# Patient Record
Sex: Male | Born: 1965 | Race: White | Hispanic: No | Marital: Married | Smoking: Never smoker
Health system: Southern US, Community
[De-identification: ages and names within clinical notes are randomized; demographics above are authoritative.]

## PROBLEM LIST (undated history)

## (undated) ENCOUNTER — Emergency Department (HOSPITAL_COMMUNITY): Payer: Self-pay | Source: Home / Self Care

## (undated) DIAGNOSIS — I2510 Atherosclerotic heart disease of native coronary artery without angina pectoris: Secondary | ICD-10-CM

## (undated) DIAGNOSIS — K219 Gastro-esophageal reflux disease without esophagitis: Secondary | ICD-10-CM

## (undated) DIAGNOSIS — H409 Unspecified glaucoma: Secondary | ICD-10-CM

## (undated) DIAGNOSIS — M549 Dorsalgia, unspecified: Secondary | ICD-10-CM

## (undated) DIAGNOSIS — T7840XA Allergy, unspecified, initial encounter: Secondary | ICD-10-CM

## (undated) DIAGNOSIS — Z22322 Carrier or suspected carrier of Methicillin resistant Staphylococcus aureus: Secondary | ICD-10-CM

## (undated) DIAGNOSIS — I209 Angina pectoris, unspecified: Secondary | ICD-10-CM

## (undated) DIAGNOSIS — K760 Fatty (change of) liver, not elsewhere classified: Secondary | ICD-10-CM

## (undated) DIAGNOSIS — G4730 Sleep apnea, unspecified: Secondary | ICD-10-CM

## (undated) DIAGNOSIS — I10 Essential (primary) hypertension: Secondary | ICD-10-CM

## (undated) HISTORY — PX: TYMPANOSTOMY TUBE PLACEMENT: SHX32

## (undated) HISTORY — PX: CERVICAL FUSION: SHX112

## (undated) HISTORY — DX: Dorsalgia, unspecified: M54.9

## (undated) HISTORY — DX: Carrier or suspected carrier of methicillin resistant Staphylococcus aureus: Z22.322

## (undated) HISTORY — PX: COLONOSCOPY: SHX174

## (undated) HISTORY — DX: Essential (primary) hypertension: I10

## (undated) HISTORY — PX: TONSILLECTOMY AND ADENOIDECTOMY: SHX28

## (undated) HISTORY — DX: Allergy, unspecified, initial encounter: T78.40XA

## (undated) HISTORY — DX: Sleep apnea, unspecified: G47.30

## (undated) HISTORY — DX: Unspecified glaucoma: H40.9

## (undated) HISTORY — PX: OTHER SURGICAL HISTORY: SHX169

---

## 2015-03-25 ENCOUNTER — Ambulatory Visit (INDEPENDENT_AMBULATORY_CARE_PROVIDER_SITE_OTHER): Payer: Federal, State, Local not specified - PPO | Admitting: Family Medicine

## 2015-03-25 VITALS — BP 138/90 | HR 82 | Temp 98.4°F | Resp 16 | Ht 66.0 in | Wt 221.0 lb

## 2015-03-25 DIAGNOSIS — M549 Dorsalgia, unspecified: Secondary | ICD-10-CM | POA: Diagnosis not present

## 2015-03-25 DIAGNOSIS — G8929 Other chronic pain: Secondary | ICD-10-CM | POA: Diagnosis not present

## 2015-03-25 DIAGNOSIS — M5136 Other intervertebral disc degeneration, lumbar region: Secondary | ICD-10-CM | POA: Diagnosis not present

## 2015-03-25 MED ORDER — TRAMADOL HCL 50 MG PO TABS: 50.0000 mg | ORAL_TABLET | Freq: Three times a day (TID) | ORAL | Status: DC | PRN

## 2015-03-25 MED ORDER — IBUPROFEN-FAMOTIDINE 800-26.6 MG PO TABS
1.0000 | ORAL_TABLET | Freq: Three times a day (TID) | ORAL | Status: DC
Start: 2015-03-25 — End: 2015-03-25

## 2015-03-25 MED ORDER — CYCLOBENZAPRINE HCL 5 MG PO TABS: 5.0000 mg | ORAL_TABLET | Freq: Three times a day (TID) | ORAL | Status: DC | PRN

## 2015-03-25 MED ORDER — PREDNISONE 20 MG PO TABS: ORAL_TABLET | ORAL | Status: DC

## 2015-03-25 MED ORDER — IBUPROFEN-FAMOTIDINE 800-26.6 MG PO TABS: 1.0000 | ORAL_TABLET | Freq: Three times a day (TID) | ORAL | Status: DC

## 2015-03-25 NOTE — Patient Instructions (Signed)
We are going to treat your back with prednisone for 10 days- this should help especially with your tingling and numbness Continue flexeril as needed Use the tramadol as needed for pain- remember this can make you drowsy; it may make you more drowsy if combined with flexeril While you are on the prednisone do not use the ibuprofen   We will refer you to neurosurgery- if they are not the right person to manage your epidural steroid injection let me know   Let me know if your leg/ back are not back to baseline in the next few days

## 2015-03-25 NOTE — Progress Notes (Signed)
[facility] [address] [place] [address] [phone]  Date:  03/25/2015   Name:  Christopher [name]   DOB:  10/22/1965   MRN:  [id number]  PCP:  No primary care provider on file.    Chief Complaint: Back Pain   History of Present Illness:  Christopher [name] is a 49 y.o. very pleasant male patient who presents with the following:  Here today as a new patient seeking primary care.  He moved to [place] from [place] just recently. 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  He had some sort of adjustment this past Tuesday- today is Friday. The day after his adjustment he was in a lot more pain.  Somewhat reduced pain now but still more than his baseline.   He has had several MRI scans in the past.  Most recent was in June.  He brings a copy of his report which does show several mildly bulging discs and mild canal stenosis He had done occasional epidural steroid injections in his back- last done 12/30/2014.  He then moved houses which flared up his back pain  He has noted some numbness into his right leg for about 2 months, off and on.  It is more persistent just recently .  No bowel or bladder incontinence concerns   He had a pain management doc, a Licensed conveyancer, and a chiropractor in [place].   He  He takes 5mg  of flexeril at night, and takes ibuprofen during the day.  He takes an ibuprofen 800/ famotidine combination product    There are no active problems to display for this patient.   Past Medical History  Diagnosis Date  . Back pain   . MRSA carrier     Past Surgical History  Procedure Laterality Date  . Cervical fusion    . 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 Smoking status: Never Smoker   . Smokeless  tobacco: Never Used  . Alcohol Use: No    Family History  Problem Relation Age of Onset  . Hyperlipidemia Father   . Cancer Maternal Grandfather     Allergies  Allergen Reactions  . Biaxin [Clarithromycin] Shortness Of Breath    Medication list has been reviewed and updated.  No current outpatient prescriptions on file prior to visit.   No current facility-administered medications on file prior to visit.    Review of Systems:  As per HPI- otherwise negative.   Physical Examination: Filed Vitals:   03/25/15 1813  BP: 138/90  Pulse: 82  Temp: 98.4 F (36.9 C)  Resp: 16   Filed Vitals:   03/25/15 1813  Height: 5\' 6"  (1.676 m)  Weight: 221 lb (100.245 kg)   Body mass index is 35.69 kg/(m^2). Ideal Body Weight: Weight in (lb) to have BMI = 25: 154.6  GEN: WDWN, NAD, Non-toxic, A & O x 3, overweight, looks well HEENT: Atraumatic, Normocephalic. Neck supple. No masses, No LAD. Ears and Nose: No external deformity. CV: RRR, No M/G/R. No JVD. No thrill. No extra heart sounds. PULM: CTA B, no wheezes, crackles, rhonchi. No retractions. No resp. distress. No accessory muscle use. EXTR: No c/c/e NEURO Normal  gait.  PSYCH: Normally interactive. Conversant. Not depressed or anxious appearing.  Calm demeanor.  Normal BLE strength and sensation and DTR>  Slightly positive SLR on the right only Normal lumbar flexion, extension No lumbar tenderness to palpation  Assessment and Plan: Degenerative disc disease, lumbar - Plan: predniSONE (DELTASONE) 20 MG tablet, cyclobenzaprine (FLEXERIL) 5 MG tablet, Ambulatory referral to Neurosurgery, traMADol (ULTRAM) 50 MG tablet, Ibuprofen-Famotidine (DUEXIS) 800-26.6 MG TABS, DISCONTINUED: Ibuprofen-Famotidine (DUEXIS) 800-26.6 MG TABS  Treat for exacerbation of his lumbar pain with prednisone.  Tramadol to use as needed for pain because he cannot use his ibuprofen while on the prednisone  Referral to [facility]- they may also be able to do  his injections as well. If they cannot I can refer elsewhere   Signed Lamar Blinks, MD

## 2015-03-26 ENCOUNTER — Telehealth: Payer: Self-pay

## 2015-03-26 NOTE — Telephone Encounter (Signed)
-  Faxed failed yesterday for Due[facility] Rx to [facility]. I called pt because 2 long distance ph #s were written on Rx by Dr Lorelei Pont and wanted to verify what pharm Rx was to go to. Pt clarified to send to [facility] at ph # [phone]. Called in Rx.

## 2015-04-12 ENCOUNTER — Telehealth: Payer: Self-pay

## 2015-04-12 DIAGNOSIS — M545 Low back pain, unspecified: Secondary | ICD-10-CM

## 2015-04-12 NOTE — Telephone Encounter (Signed)
Ibuprofen-Famotidine (DUEXIS) 800-26.6 MG TABS [id number]  Is not covered by insurance, PA required.  What is the patient's diagnosis?  Arthritis Osteoarthritis (OA) Rheumatoid Arthritis (RA) Other diagnosis, please specify: Unknown / Unavailable  Can we say he has any of these?  Question on PA form. I know he has degenerative disc disease.

## 2015-04-15 MED ORDER — IBUPROFEN 800 MG PO TABS: 800.0000 mg | ORAL_TABLET | Freq: Three times a day (TID) | ORAL | Status: DC | PRN

## 2015-04-15 MED ORDER — FAMOTIDINE 20 MG PO TABS
20.0000 mg | ORAL_TABLET | Freq: Two times a day (BID) | ORAL | Status: DC
Start: 2015-04-15 — End: 2015-08-19

## 2015-04-15 NOTE — Telephone Encounter (Signed)
Called him- he takes the duoxexis generally twice a day for his back pain.  However as this is no longer covered he is willing to try separate ibuprofen and pepcid.  Will rx this for him; he will let me know if not as helpful as his duoxexis  Meds ordered this encounter  Medications  . ibuprofen (ADVIL,MOTRIN) 800 MG tablet    Sig: Take 1 tablet (800 mg total) by mouth every 8 (eight) hours as needed.    Dispense:  90 tablet    Refill:  6  . famotidine (PEPCID) 20 MG tablet    Sig: Take 1 tablet (20 mg total) by mouth 2 (two) times daily.    Dispense:  60 tablet    Refill:  6

## 2015-04-16 ENCOUNTER — Ambulatory Visit (INDEPENDENT_AMBULATORY_CARE_PROVIDER_SITE_OTHER): Payer: Federal, State, Local not specified - PPO | Admitting: Emergency Medicine

## 2015-04-16 VITALS — BP 132/84 | HR 101 | Temp 97.9°F | Resp 16 | Ht 66.0 in | Wt 217.8 lb

## 2015-04-16 DIAGNOSIS — H109 Unspecified conjunctivitis: Secondary | ICD-10-CM | POA: Diagnosis not present

## 2015-04-16 DIAGNOSIS — J0140 Acute pansinusitis, unspecified: Secondary | ICD-10-CM

## 2015-04-16 MED ORDER — AMOXICILLIN-POT CLAVULANATE 875-125 MG PO TABS: 1.0000 | ORAL_TABLET | Freq: Two times a day (BID) | ORAL | Status: DC

## 2015-04-16 MED ORDER — PSEUDOEPHEDRINE-GUAIFENESIN ER 60-600 MG PO TB12: 1.0000 | ORAL_TABLET | Freq: Two times a day (BID) | ORAL | Status: DC

## 2015-04-16 MED ORDER — MOXIFLOXACIN HCL 0.5 % OP SOLN: 1.0000 [drp] | Freq: Three times a day (TID) | OPHTHALMIC | Status: DC

## 2015-04-16 NOTE — Patient Instructions (Signed)

## 2015-04-16 NOTE — Progress Notes (Signed)
Subjective:  Patient ID: Christopher [name], male    DOB: 09-Jun-1965  Age: 49 y.o. MRN: [id number]  CC: Eye Problem   HPI Senon Joynes presents  with nasal congestion postnasal drainage and pressure in his right maxillary sinus. He has a concern that he has pinkeye. He has had some drainage from his eye. He has some mild conjunctivitis denies any foreign body sensation or visual symptoms. Has no cough wheezing shortness breath fever chills. No sore throat or ear pain had no improvement with over-the-counter medication  History Kevork has a past medical history of Back pain and MRSA carrier.   He has past surgical history that includes Cervical fusion and carple tunnel in both hands .   His  family history includes Cancer in his maternal grandfather; Hyperlipidemia in his father.  He   reports that he has never smoked. He has never used smokeless tobacco. He reports that he does not drink alcohol or use illicit drugs.  Outpatient Prescriptions Prior to Visit  Medication Sig Dispense Refill  . aspirin 81 MG tablet Take 81 mg by mouth daily.    . calcipotriene (DOVONOX) 0.005 % cream Apply topically 2 (two) times daily.    . cyclobenzaprine (FLEXERIL) 5 MG tablet Take 1 tablet (5 mg total) by mouth 3 (three) times daily as needed for muscle spasms. 40 tablet 1  . fluocinolone (VANOS) 0.01 % cream Apply topically 2 (two) times daily.    Marland Kitchen ibuprofen (ADVIL,MOTRIN) 800 MG tablet Take 1 tablet (800 mg total) by mouth every 8 (eight) hours as needed. 90 tablet 6  . losartan (COZAAR) 50 MG tablet Take 50 mg by mouth daily.    Marland Kitchen omega-3 acid ethyl esters (LOVAZA) 1 G capsule Take by mouth 2 (two) times daily.    . traMADol (ULTRAM) 50 MG tablet Take 1 tablet (50 mg total) by mouth every 8 (eight) hours as needed. 30 tablet 0  . travoprost, benzalkonium, (TRAVATAN) 0.004 % ophthalmic solution 1 drop at bedtime.    . famotidine (PEPCID) 20 MG tablet Take 1 tablet (20 mg total) by mouth 2 (two)  times daily. (Patient not taking: Reported on 04/16/2015) 60 tablet 6  . Ibuprofen-Famotidine (DUEXIS) 800-26.6 MG TABS Take 1 tablet by mouth every 8 (eight) hours. 90 tablet 6  . Ibuprofen-Famotidine 800-26.6 MG TABS Take by mouth.    . predniSONE (DELTASONE) 20 MG tablet Take 2 pills a day for 5 days, then 1 pill a day for 5 days 15 tablet 0   No facility-administered medications prior to visit.    Social History   Social History  . Marital Status: Married    Spouse Name: N/A  . Number of Children: N/A  . Years of Education: N/A   Social History Main Topics  . Smoking status: Never Smoker   . Smokeless tobacco: Never Used  . Alcohol Use: No  . Drug Use: No  . Sexual Activity: Not Asked   Other Topics Concern  . None   Social History Narrative     Review of Systems  Constitutional: Negative for fever, chills and appetite change.  HENT: Positive for congestion, postnasal drip, rhinorrhea and sinus pressure. Negative for ear pain and sore throat.   Eyes: Negative for pain and redness.  Respiratory: Negative for cough, shortness of breath and wheezing.   Cardiovascular: Negative for leg swelling.  Gastrointestinal: Negative for nausea, vomiting, abdominal pain, diarrhea, constipation and blood in stool.  Endocrine: Negative for polyuria.  Genitourinary: Negative  for dysuria, urgency, frequency and flank pain.  Musculoskeletal: Negative for gait problem.  Skin: Negative for rash.  Neurological: Negative for weakness and headaches.  Psychiatric/Behavioral: Negative for confusion and decreased concentration. The patient is not nervous/anxious.     Objective:  BP 132/84 mmHg  Pulse 101  Temp(Src) 97.9 F (36.6 C) (Oral)  Resp 16  Ht 5\' 6"  (1.676 m)  Wt 217 lb 12.8 oz (98.793 kg)  BMI 35.17 kg/m2  SpO2 98%  Physical Exam  Constitutional: He is oriented to person, place, and time. He appears well-developed and well-nourished. No distress.  HENT:  Head:  Normocephalic and atraumatic.  Right Ear: External ear normal.  Left Ear: External ear normal.  Nose: Nose normal.  Eyes: EOM are normal. Pupils are equal, round, and reactive to light. Right eye exhibits discharge. Right conjunctiva is injected. No scleral icterus.  Neck: Normal range of motion. Neck supple. No tracheal deviation present.  Cardiovascular: Normal rate, regular rhythm and normal heart sounds.   Pulmonary/Chest: Effort normal. No respiratory distress. He has no wheezes. He has no rales.  Abdominal: He exhibits no mass. There is no tenderness. There is no rebound and no guarding.  Musculoskeletal: He exhibits no edema.  Lymphadenopathy:    He has no cervical adenopathy.  Neurological: He is alert and oriented to person, place, and time. Coordination normal.  Skin: Skin is warm and dry. No rash noted.  Psychiatric: He has a normal mood and affect. His behavior is normal.      Assessment & Plan:   Precious was seen today for eye problem.  Diagnoses and all orders for this visit:  Conjunctivitis of right eye  Acute pansinusitis, recurrence not specified  Other orders -     amoxicillin-clavulanate (AUGMENTIN) 875-125 MG tablet; Take 1 tablet by mouth 2 (two) times daily. -     pseudoephedrine-guaifenesin (MUCINEX D) 60-600 MG 12 hr tablet; Take 1 tablet by mouth every 12 (twelve) hours. -     moxifloxacin (VIGAMOX) 0.5 % ophthalmic solution; Place 1 drop into the right eye 3 (three) times daily.  I have discontinued Mr. Oelschlager Ibuprofen-Famotidine, predniSONE, and Ibuprofen-Famotidine. I am also having him start on amoxicillin-clavulanate, pseudoephedrine-guaifenesin, and moxifloxacin. Additionally, I am having him maintain his aspirin, travoprost (benzalkonium), losartan, omega-3 acid ethyl esters, calcipotriene, fluocinolone, cyclobenzaprine, traMADol, ibuprofen, famotidine, and pseudoephedrine.  Meds ordered this encounter  Medications  . pseudoephedrine  (SUDAFED) 30 MG tablet    Sig: Take 30 mg by mouth every 4 (four) hours as needed for congestion.  Marland Kitchen amoxicillin-clavulanate (AUGMENTIN) 875-125 MG tablet    Sig: Take 1 tablet by mouth 2 (two) times daily.    Dispense:  20 tablet    Refill:  0  . pseudoephedrine-guaifenesin (MUCINEX D) 60-600 MG 12 hr tablet    Sig: Take 1 tablet by mouth every 12 (twelve) hours.    Dispense:  18 tablet    Refill:  0  . moxifloxacin (VIGAMOX) 0.5 % ophthalmic solution    Sig: Place 1 drop into the right eye 3 (three) times daily.    Dispense:  3 mL    Refill:  0    Appropriate red flag conditions were discussed with the patient as well as actions that should be taken.  Patient expressed his understanding.  Follow-up: Return if symptoms worsen or fail to improve.  Roselee Culver, MD

## 2015-06-18 ENCOUNTER — Encounter: Payer: Self-pay | Admitting: Family Medicine

## 2015-06-23 ENCOUNTER — Encounter: Payer: Self-pay | Admitting: Family Medicine

## 2015-08-19 ENCOUNTER — Ambulatory Visit (INDEPENDENT_AMBULATORY_CARE_PROVIDER_SITE_OTHER): Payer: Federal, State, Local not specified - PPO | Admitting: Physician Assistant

## 2015-08-19 ENCOUNTER — Other Ambulatory Visit: Payer: Federal, State, Local not specified - PPO | Admitting: *Deleted

## 2015-08-19 VITALS — BP 118/90 | HR 81 | Temp 97.7°F | Resp 17 | Ht 66.25 in | Wt 223.0 lb

## 2015-08-19 DIAGNOSIS — M5136 Other intervertebral disc degeneration, lumbar region: Secondary | ICD-10-CM

## 2015-08-19 DIAGNOSIS — I10 Essential (primary) hypertension: Secondary | ICD-10-CM

## 2015-08-19 DIAGNOSIS — L409 Psoriasis, unspecified: Secondary | ICD-10-CM | POA: Diagnosis not present

## 2015-08-19 DIAGNOSIS — E669 Obesity, unspecified: Secondary | ICD-10-CM | POA: Diagnosis not present

## 2015-08-19 DIAGNOSIS — M549 Dorsalgia, unspecified: Secondary | ICD-10-CM | POA: Diagnosis not present

## 2015-08-19 DIAGNOSIS — E7800 Pure hypercholesterolemia, unspecified: Secondary | ICD-10-CM

## 2015-08-19 DIAGNOSIS — Z8614 Personal history of Methicillin resistant Staphylococcus aureus infection: Secondary | ICD-10-CM | POA: Diagnosis not present

## 2015-08-19 DIAGNOSIS — G8929 Other chronic pain: Secondary | ICD-10-CM | POA: Diagnosis not present

## 2015-08-19 DIAGNOSIS — Z8639 Personal history of other endocrine, nutritional and metabolic disease: Secondary | ICD-10-CM | POA: Diagnosis not present

## 2015-08-19 MED ORDER — CALCIPOTRIENE 0.005 % EX CREA: TOPICAL_CREAM | Freq: Two times a day (BID) | CUTANEOUS | Status: DC

## 2015-08-19 MED ORDER — OMEGA-3-ACID ETHYL ESTERS 1 G PO CAPS: 1.0000 g | ORAL_CAPSULE | Freq: Two times a day (BID) | ORAL | Status: DC

## 2015-08-19 MED ORDER — LOSARTAN POTASSIUM 50 MG PO TABS: 50.0000 mg | ORAL_TABLET | Freq: Every day | ORAL | Status: DC

## 2015-08-19 MED ORDER — CYCLOBENZAPRINE HCL 5 MG PO TABS: 5.0000 mg | ORAL_TABLET | Freq: Three times a day (TID) | ORAL | Status: DC | PRN

## 2015-08-19 MED ORDER — TRIAMCINOLONE ACETONIDE 0.5 % EX CREA: 1.0000 "application " | TOPICAL_CREAM | Freq: Three times a day (TID) | CUTANEOUS | Status: DC

## 2015-08-19 MED ORDER — MUPIROCIN 2 % EX OINT: 1.0000 "application " | TOPICAL_OINTMENT | Freq: Two times a day (BID) | CUTANEOUS | Status: DC

## 2015-08-19 NOTE — Patient Instructions (Signed)
     IF you received an x-ray today, you will receive an invoice from [facility]. Please contact [facility] at [phone] with questions or concerns regarding your invoice.   IF you received labwork today, you will receive an invoice from [facility]. Please contact [facility] at [phone] with questions or concerns regarding your invoice.   Our billing staff will not be able to assist you with questions regarding bills from these companies.  You will be contacted with the lab results as soon as they are available. The fastest way to get your results is to activate your My Chart account. Instructions are located on the last page of this paperwork. If you have not heard from us regarding the results in 2 weeks, please contact this office.      

## 2015-08-19 NOTE — Patient Instructions (Addendum)
  My fitness pal - app for weight loss   IF you received an x-ray today, you will receive an invoice from [facility]. Please contact [facility] at [phone] with questions or concerns regarding your invoice.   IF you received labwork today, you will receive an invoice from [facility]. Please contact [facility] at [phone] with questions or concerns regarding your invoice.   Our billing staff will not be able to assist you with questions regarding bills from these companies.  You will be contacted with the lab results as soon as they are available. The fastest way to get your results is to activate your My Chart account. Instructions are located on the last page of this paperwork. If you have not heard from [place] regarding the results in 2 weeks, please contact this office.

## 2015-08-19 NOTE — Progress Notes (Signed)
Christopher [name]  MRN: [id number] DOB: 1965-08-17  Subjective:  Pt presents to clinic for medication refills and discussion of weight loss and MRSA infections.  Patient Active Problem List   Diagnosis Date Noted  . HTN (hypertension) - controlled on medications - takes daily 08/19/2015  . History of MRSA infection - currently has one of his face and he started bactrim that he had at home and it opened and he is getting much better 08/19/2015  . Psoriasis - on left elbow - uses cream to help control 08/19/2015  . Elevated cholesterol - on Lavoza - tried a statin but quite it because of a SE that his dad had that he did not want to risk 08/19/2015  . 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 Chronic back pain - planned injection today but it was canceled because of infection on his face 03/25/2015  H/o Vit D def - about a month ago stopped Vit D 50000 weekly - wants to know if he needs to continue  Current Outpatient Prescriptions on File Prior to Visit  Medication Sig Dispense Refill  . aspirin 81 MG tablet Take 81 mg by mouth daily.    Marland Kitchen ibuprofen (ADVIL,MOTRIN) 800 MG tablet Take 1 tablet (800 mg total) by mouth every 8 (eight) hours as needed. 90 tablet 6  . travoprost, benzalkonium, (TRAVATAN) 0.004 % ophthalmic solution 1 drop at bedtime.     No current facility-administered medications on file prior to visit.    Allergies  Allergen Reactions  . Biaxin [Clarithromycin] Shortness Of Breath    Review of Systems  Respiratory: Negative for cough and shortness of breath.   Cardiovascular: Negative for chest pain, palpitations and leg swelling.   Objective:  BP 118/90 mmHg  Pulse 81   Temp(Src) 97.7 F (36.5 C) (Oral)  Resp 17  Ht 5' 6.25" (1.683 m)  Wt 223 lb (101.152 kg)  BMI 35.71 kg/m2  SpO2 98%  Physical Exam  Constitutional: He is oriented to person, place, and time and well-developed, well-nourished, and in no distress.  HENT:  Head: Normocephalic and atraumatic.  Right Ear: External ear normal.  Left Ear: External ear normal.  Eyes: Conjunctivae are normal.  Neck: Normal range of motion.  Cardiovascular: Normal rate, regular rhythm, normal heart sounds and intact distal pulses.   Pulmonary/Chest: Effort normal and breath sounds normal. He has no wheezes.  Musculoskeletal:       Right lower leg: He exhibits no edema.       Left lower leg: He exhibits no edema.  Neurological: He is alert and oriented to person, place, and time. Gait normal.  Skin: Skin is warm and dry.  1 cm indurated mildly erythematous area on his right cheek bone area - mildly tender  Psychiatric: Mood, memory, affect and judgment normal.   Spent 45 mins with patient >50% spent in counseling Assessment and Plan :  Essential hypertension - Plan: COMPLETE METABOLIC PANEL WITH GFR, losartan (COZAAR) 50 MG tablet, Care order/instruction - continue current medications - diastolic BP is slightly elevated today - losing weight will help this a lot  History of MRSA infection - Plan: mupirocin ointment (BACTROBAN) 2 % - pt to use up his nose and on current open wounds - he will continue the septra that he has for his current facial abscess which is better - he will f/u in the future  Degenerative disc disease, lumbar - Plan: cyclobenzaprine (FLEXERIL) 5 MG tablet - gave him muscle relaxers for when his back acts up  Psoriasis - Plan: triamcinolone cream (KENALOG) 0.5 %, calcipotriene (DOVONOX) 0.005 % cream - continue creams as needed  History of vitamin D deficiency - Plan: VITAMIN D 25 Hydroxy (Vit-D Deficiency, Fractures) - check labs  Chronic back pain - continue with back specialist as  needed  Elevated cholesterol - Plan: omega-3 acid ethyl esters (LOVAZA) 1 g capsule, Lipid panel - check labs - pt will RTC this afterrnon after fasting for 6 hrs to get a good accurate reading  Obesity, Class II, BMI 35-39.9 - d/w pt at length risk and benefits of diet aids - he discussed weight loss is best achieved by caloric intake change - he should be about at a 1500 cal/day diet.  He is not interested in a nutritionist at this point but he will beginning making modification.  He thinks it will be a lot better now that he is home vs on the road for work.  Windell Hummingbird PA-C  [facility] 08/19/2015 9:49 AM

## 2015-08-20 ENCOUNTER — Encounter: Payer: Self-pay | Admitting: Physician Assistant

## 2015-08-20 LAB — COMPLETE METABOLIC PANEL WITH GFR
ALBUMIN: 4.3 g/dL (ref 3.6–5.1)
ALK PHOS: 75 U/L (ref 40–115)
ALT: 104 U/L — ABNORMAL HIGH (ref 9–46)
AST: 54 U/L — AB (ref 10–40)
BUN: 13 mg/dL (ref 7–25)
CALCIUM: 9.4 mg/dL (ref 8.6–10.3)
CHLORIDE: 104 mmol/L (ref 98–110)
CO2: 26 mmol/L (ref 20–31)
Creat: 1.03 mg/dL (ref 0.60–1.35)
GFR, EST NON AFRICAN AMERICAN: 86 mL/min (ref >=60)
Glucose, Bld: 95 mg/dL (ref 65–99)
POTASSIUM: 4.6 mmol/L (ref 3.5–5.3)
Sodium: 139 mmol/L (ref 135–146)
Total Bilirubin: 0.4 mg/dL (ref 0.2–1.2)
Total Protein: 6.6 g/dL (ref 6.1–8.1)

## 2015-08-20 LAB — LIPID PANEL
CHOL/HDL RATIO: 8.9 ratio — AB (ref <=5.0)
CHOLESTEROL: 195 mg/dL (ref 125–200)
HDL: 22 mg/dL — ABNORMAL LOW (ref >=40)
TRIGLYCERIDES: 446 mg/dL — AB (ref <150)

## 2015-08-20 LAB — VITAMIN D 25 HYDROXY (VIT D DEFICIENCY, FRACTURES): Vit D, 25-Hydroxy: 37 ng/mL (ref 30–100)

## 2015-08-24 ENCOUNTER — Telehealth: Payer: Self-pay

## 2015-08-24 NOTE — Telephone Encounter (Signed)
Pt would like to know if  Christopher [name] would refer him to [facility] for a sleep study. Please call pt at [phone] and the number to the sleep study is [phone], pt states he would Like it done asap

## 2015-08-27 ENCOUNTER — Telehealth: Payer: Self-pay | Admitting: Physician Assistant

## 2015-08-27 DIAGNOSIS — G4730 Sleep apnea, unspecified: Secondary | ICD-10-CM

## 2015-08-27 NOTE — Telephone Encounter (Signed)
Please call patient

## 2015-08-27 NOTE — Telephone Encounter (Signed)
Why does he think he needs one - I do not mind but I need a reason - snoring - stops breathing at night etc.

## 2015-08-27 NOTE — Telephone Encounter (Signed)
Left message for pt to call back  °

## 2015-08-27 NOTE — Telephone Encounter (Signed)
Patient returning phone call. Please give patient a call back,

## 2015-08-30 NOTE — Telephone Encounter (Signed)
LM to call me back for information on why he wants a sleep study.

## 2015-08-30 NOTE — Telephone Encounter (Signed)
Spoke with pt, he needs a referral to a doctor to read the his CPAP machine. The [employer] needs this. He had a study done back in 2009 in [place]. Please advise.

## 2015-08-31 DIAGNOSIS — G4730 Sleep apnea, unspecified: Secondary | ICD-10-CM | POA: Insufficient documentation

## 2015-08-31 DIAGNOSIS — G4733 Obstructive sleep apnea (adult) (pediatric): Secondary | ICD-10-CM | POA: Insufficient documentation

## 2015-08-31 NOTE — Telephone Encounter (Signed)
Does he need a print out or does he need another titration - if he needs a print out he should be able to call the company (on the label on his machine) and they should be able to help him.  If he needs a titration I can do a referral.

## 2015-08-31 NOTE — Telephone Encounter (Signed)
Left message for pt to call back  °

## 2015-09-02 NOTE — Telephone Encounter (Signed)
Done - did referral to neurology

## 2015-10-11 ENCOUNTER — Encounter: Payer: Self-pay | Admitting: Neurology

## 2015-10-11 ENCOUNTER — Ambulatory Visit (INDEPENDENT_AMBULATORY_CARE_PROVIDER_SITE_OTHER): Payer: Federal, State, Local not specified - PPO | Admitting: Neurology

## 2015-10-11 VITALS — BP 146/98 | HR 88 | Resp 20 | Ht 65.0 in | Wt 218.0 lb

## 2015-10-11 DIAGNOSIS — G4733 Obstructive sleep apnea (adult) (pediatric): Secondary | ICD-10-CM | POA: Diagnosis not present

## 2015-10-11 DIAGNOSIS — Z9989 Dependence on other enabling machines and devices: Principal | ICD-10-CM

## 2015-10-11 NOTE — Progress Notes (Signed)
SLEEP MEDICINE CLINIC   Provider:  Larey Seat, M D  Referring Provider: Darreld Mclean, MD Primary Care Physician:  Lamar Blinks, MD  Chief Complaint  Patient presents with  . 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 male , seen here as a referral from Dr. 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 The patient provided me with several copies of previous sleep studies. The patient also owns 2 machines- one of the normal longer downloadable through our software. Christopher [name]. presented with a sleep study from [place] [facility]. At the time in 4058 he was 50 year old gentleman referred for possible sleep disordered breathing he reached an AHI of 88 with an oxygen nadir of 64%, had loud snoring, no PLMS were noted. He was titrated to 15 cm water pressure he used a nasal interface. This is a titration his AHI was reduced to 4.4 an improvement of over 90%. BiPAP was not attempted. On 15 cm the AHI was 0.8.  He then underwent a new titration on 11/12/2013 in [place]. He was re-titrated to CPAP this time he had frequent periodic limb movements for an index of 20.4 but very few arousals. He was titrated to 14 cm water pressure but his AHI was 3.5. I would venture to say that he was under titrated.  The patient To his old machine and received a new one which she has preferably used since. He has used a new machine 329 out of 365 days which is at 90% compliance he has 100% compliance for the last 30 days his AHI is 1.7 on 14 cm water was 1 cm EPR. Average user time is 8 hours and 5 minutes. Serial number is [id number]. The patient endorsed today the Epworth Sleepiness Scale at 0 points. The patient is using CPAP  now for almost a decade and is highly compliant.  Sleep habits are as follows: He goes to bed at 9:30 PM and usually falls asleep promptly. The bedroom is cool quiet and dark, he prefers to sleep on his side on one pillow. He rises at 5:30 with an alarm. Total sleep time is estimated to exceed 7-1/2 hours- more likely 8 hours. He feels refreshed and rested.   Medical history ; HTN, obesity, high cholesterol.  Social history: 3 stepdaughters, 1 biological .  Review of Systems: Out of a complete 14 system review, the patient complains of only the following symptoms, and all other reviewed systems are negative. Snoring, weight gain.  Epworth score  0 , Fatigue severity score 9  , depression score 0   Social History   Social History  . Marital Status: Married    Spouse Name: N/A  . Number of Children: N/A  . Years of Education: N/A   Occupational History  . Not on file.   Social History Main Topics  . Smoking status: Never Smoker   . Smokeless tobacco: Never Used  . Alcohol Use: No  . Drug Use: No  . Sexual Activity: Not on file   Other Topics Concern  . Not on file   Social History Narrative    Family History  Problem Relation Age  of Onset  . Hyperlipidemia Father   . Cancer Maternal Grandfather     Past Medical History  Diagnosis Date  . Back pain   . MRSA carrier     Past Surgical History  Procedure Laterality Date  . Cervical fusion    . 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 aspirin 81 MG tablet Take 81 mg by mouth daily.    . cyclobenzaprine (FLEXERIL) 5 MG tablet Take 1 tablet (5 mg total) by mouth 3 (three) times daily as needed for muscle spasms. 60 tablet 1  . ibuprofen (ADVIL,MOTRIN) 800 MG tablet Take 1 tablet (800 mg total) by mouth every 8 (eight) hours as needed. 90 tablet 6  . losartan (COZAAR) 50 MG tablet Take 1 tablet (50 mg total) by mouth daily. 90 tablet 1  . mupirocin  ointment (BACTROBAN) 2 % Place 1 application into the nose 2 (two) times daily. 30 g 3  . omega-3 acid ethyl esters (LOVAZA) 1 g capsule Take 1 capsule (1 g total) by mouth 2 (two) times daily. 180 capsule 1  . travoprost, benzalkonium, (TRAVATAN) 0.004 % ophthalmic solution 1 drop at bedtime.     No current facility-administered medications for this visit.    Allergies as of 10/11/2015 - Review Complete 10/11/2015  Allergen Reaction Noted  . Biaxin [clarithromycin] Shortness Of Breath 03/25/2015    Vitals: BP 146/98 mmHg  Pulse 88  Resp 20  Ht 5\' 5"  (1.651 m)  Wt 218 lb (98.884 kg)  BMI 36.28 kg/m2 Last Weight:  Wt Readings from Last 1 Encounters:  10/11/15 218 lb (98.884 kg)   [id number] mass index is 36.28 kg/(m^2).     Last Height:   Ht Readings from Last 1 Encounters:  10/11/15 5\' 5"  (1.651 m)    Physical exam:  General: The patient is awake, alert and appears not in acute distress. The patient is well groomed. Head: Normocephalic, atraumatic. Neck is supple. Mallampati 3,  neck circumference: 20. Nasal airflow unrestricted , TMJ  Click  not  evident . Retrognathia is seen.  Cardiovascular:  Regular rate and rhythm, without  murmurs or carotid bruit, and without distended neck veins. Respiratory: Lungs are clear to auscultation. Skin:  Without evidence of edema, or rash . Trunk: BMI is high . The patient's posture is erect.  Neurologic exam : The patient is awake and alert, oriented to place and time.   Attention span & concentration ability appears normal.  Speech is fluent,  without dysarthria, dysphonia or aphasia.  Mood and affect are appropriate.  Cranial nerves:  Intact smell and taste .Pupils are equal and briskly reactive to light.   Extraocular movements  in vertical and horizontal planes intact and without nystagmus. Visual fields by finger perimetry are intact. Hearing to finger rub intact. Facial sensation intact to fine touch. Facial motor strength is  symmetric and tongue and uvula move midline. Shoulder shrug was symmetrical.  Motor exam:  Tone, muscle bulk and strength intact in all extremities. Sensory:  Fine touch, pinprick and vibration, proprioception tested in the upper extremities was normal. Coordination: Rapid alternating movements in the fingers/hands was normal.  Finger-to-nose maneuver  normal without evidence of ataxia, dysmetria or tremor. Gait and station: Patient walks without assistive device and is able unassisted to climb up to the exam table. Strength within normal limits. Stance is stable and normal. Deep tendon reflexes: in the  upper and  lower extremities are symmetric and intact.  Babinski maneuver response is downgoing.  The patient was advised of the nature of the diagnosed sleep disorder , the treatment options and risks for general a health and wellness arising from not treating the condition.  I spent more than 30 minutes of face to face time with the patient. Greater than 50% of time was spent in counseling and coordination of care. We have discussed the diagnosis and differential and I answered the patient's questions.    Assessment:  After physical and neurologic examination, review of laboratory studies,  Personal review of imaging studies, reports of other /same  Imaging studies ,  Results of polysomnography/ neurophysiology testing and pre-existing records as far as provided in visit., my assessment is   1) Christopher [name] is patient with a brand new CPAP set at good therapeutic pressure, and has high compliance data.  He should be qualified to fly.   2)  OSA with main risk factor of weight and neck seize.    Plan:  Treatment plan and additional workup : yearly follow up.    Christopher Partridge Loyalty Brashier MD  10/11/2015   CC: Darreld Mclean, [address], [place] [address]

## 2015-10-11 NOTE — Patient Instructions (Addendum)
Patient is FAA standard compliant with CPAP use.  10-11-2015  To whom it may concern,  [name] is 100% compliance for time in days of use of his CPAP machine and controls his sleep apnea successful. His residual AHI is 1.7 which is a very desirable result. He uses the machine over 8 hours each night. I was able to review at 365 day and a 30 day download. He fulfills all criteria of the FFA.   Tam Delisle, MD

## 2015-11-10 ENCOUNTER — Ambulatory Visit (INDEPENDENT_AMBULATORY_CARE_PROVIDER_SITE_OTHER): Payer: Federal, State, Local not specified - PPO | Admitting: Internal Medicine

## 2015-11-10 VITALS — BP 114/72 | HR 76 | Temp 98.0°F | Resp 18 | Ht 65.0 in | Wt 212.2 lb

## 2015-11-10 DIAGNOSIS — A09 Infectious gastroenteritis and colitis, unspecified: Secondary | ICD-10-CM | POA: Diagnosis not present

## 2015-11-10 DIAGNOSIS — R197 Diarrhea, unspecified: Secondary | ICD-10-CM

## 2015-11-10 DIAGNOSIS — R112 Nausea with vomiting, unspecified: Secondary | ICD-10-CM | POA: Diagnosis not present

## 2015-11-10 LAB — POCT CBC
Granulocyte percent: 71.1 %G (ref 37–80)
HCT, POC: 44.8 % (ref 43.5–53.7)
HEMOGLOBIN: 15.6 g/dL (ref 14.1–18.1)
LYMPH, POC: 2.6 (ref 0.6–3.4)
MCH, POC: 29.8 pg (ref 27–31.2)
MCHC: 34.9 g/dL (ref 31.8–35.4)
MCV: 85.5 fL (ref 80–97)
MID (cbc): 0.5 (ref 0–0.9)
MPV: 7.1 fL (ref 0–99.8)
POC Granulocyte: 7.5 — AB (ref 2–6.9)
POC LYMPH PERCENT: 24.5 %L (ref 10–50)
POC MID %: 4.4 %M (ref 0–12)
Platelet Count, POC: 308 10*3/uL (ref 142–424)
RBC: 5.24 M/uL (ref 4.69–6.13)
RDW, POC: 12.3 %
WBC: 10.5 10*3/uL — AB (ref 4.6–10.2)

## 2015-11-10 MED ORDER — DICYCLOMINE HCL 10 MG PO CAPS: 20.0000 mg | ORAL_CAPSULE | Freq: Three times a day (TID) | ORAL | Status: DC

## 2015-11-10 MED ORDER — CIPROFLOXACIN HCL 250 MG PO TABS: 250.0000 mg | ORAL_TABLET | Freq: Two times a day (BID) | ORAL | Status: DC

## 2015-11-10 NOTE — Patient Instructions (Signed)
     IF you received an x-ray today, you will receive an invoice from [facility]. Please contact [facility] at [phone] with questions or concerns regarding your invoice.   IF you received labwork today, you will receive an invoice from [facility]. Please contact [facility] at [phone] with questions or concerns regarding your invoice.   Our billing staff will not be able to assist you with questions regarding bills from these companies.  You will be contacted with the lab results as soon as they are available. The fastest way to get your results is to activate your My Chart account. Instructions are located on the last page of this paperwork. If you have not heard from us regarding the results in 2 weeks, please contact this office.      

## 2015-11-10 NOTE — Progress Notes (Signed)
Subjective:  By signing my name below, I, Raven Small, attest that this documentation has been prepared under the direction and in the presence of Tami Lin, MD.  Electronically Signed: Thea Alken, ED Scribe. 11/10/2015. 6:15 PM.   Patient ID: Christopher [name], male    DOB: 03/24/1966, 50 y.o.   MRN: [id number]  HPI Chief Complaint  Patient presents with  . Nausea    started on Friday after eating at [facility], unable to eat  . Diarrhea    started on Friday    HPI Comments: Christopher [name] is a 50 y.o. male who presents to the [facility] complaining of nausea and diarrhea onset 5 days ago. Pt recalls developing nausea and diarrhea after eat 2 cheese burgers from [employer] in [place] 5 days ago. Diarrhea is resolved but still has nausea and abdominal pain after eating. He has been taking imodium and pepto. He denies blood in stool.  Patient Active Problem List   Diagnosis Date Noted  . Sleep apnea 08/31/2015  . HTN (hypertension) 08/19/2015  . History of MRSA infection 08/19/2015  . Psoriasis 08/19/2015  . Elevated cholesterol 08/19/2015  . Obesity, Class II, BMI 35-39.9 08/19/2015  . Chronic back pain 03/25/2015   Past Medical History  Diagnosis Date  . Back pain   . MRSA carrier    Past Surgical History  Procedure Laterality Date  . Cervical fusion    . 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 Biaxin [Clarithromycin] Shortness Of Breath   Prior to Admission medications   Medication Sig Start Date End Date Taking? Authorizing Provider  aspirin 81 MG tablet Take 81 mg by mouth daily.   Yes Historical Provider, MD  bismuth subsalicylate (PEPTO BISMOL) 262 MG/15ML suspension Take 30 mLs by mouth every 6 (six) hours as needed.   Yes Historical Provider, MD  cyclobenzaprine (FLEXERIL) 5 MG tablet Take 1 tablet (5 mg total) by mouth 3 (three) times daily as needed for muscle spasms. 08/19/15  Yes Mancel Bale, PA-C    ibuprofen (ADVIL,MOTRIN) 800 MG tablet Take 1 tablet (800 mg total) by mouth every 8 (eight) hours as needed. 04/15/15  Yes Gay Filler Copland, MD  losartan (COZAAR) 50 MG tablet Take 1 tablet (50 mg total) by mouth daily. 08/19/15  Yes Mancel Bale, PA-C  omega-3 acid ethyl esters (LOVAZA) 1 g capsule Take 1 capsule (1 g total) by mouth 2 (two) times daily. 08/19/15  Yes Mancel Bale, PA-C  travoprost, benzalkonium, (TRAVATAN) 0.004 % ophthalmic solution 1 drop at bedtime.   Yes Historical Provider, MD  mupirocin ointment (BACTROBAN) 2 % Place 1 application into the nose 2 (two) times daily. Patient not taking: Reported on 11/10/2015 08/19/15   Mancel Bale, PA-C   Social History   Social History  . Marital Status: Married    Spouse Name: N/A  . Number of Children: N/A  . Years of Education: N/A   Occupational History  . Not on file.   Social History Main Topics  . Smoking status: Never Smoker   . Smokeless tobacco: Never Used  . Alcohol Use: No  . Drug Use: No  . Sexual Activity: Not on file   Other Topics Concern  . Not on file   Social History Narrative   Review of Systems  Constitutional: Negative for fever and chills.  Gastrointestinal: Positive for nausea, abdominal pain and diarrhea. Negative for blood in stool.  Objective:   Physical Exam  Constitutional: He is oriented to person, place, and time. He appears well-developed and well-nourished. No distress.  HENT:  Head: Normocephalic and atraumatic.  Right Ear: External ear normal.  Left Ear: External ear normal.  Eyes: Conjunctivae and EOM are normal.  Neck: Normal range of motion. Neck supple.  Cardiovascular: Normal rate, regular rhythm and normal heart sounds.   No murmur heard. Pulmonary/Chest: Effort normal and breath sounds normal.  Abdominal: Soft. Bowel sounds are increased. There is tenderness ( mildly) in the right lower quadrant and left lower quadrant. There is no rebound.  Musculoskeletal:  Normal range of motion.  Neurological: He is alert and oriented to person, place, and time.  Skin: Skin is warm and dry.  Psychiatric: He has a normal mood and affect. His behavior is normal.  Nursing note and vitals reviewed.  Filed Vitals:   11/10/15 1729  BP: 114/72  Pulse: 76  Temp: 98 F (36.7 C)  TempSrc: Oral  Resp: 18  Height: 5\' 5"  (1.651 m)  Weight: 212 lb 3.2 oz (96.253 kg)  SpO2: 98%    Results for orders placed or performed in visit on 11/10/15  POCT CBC  Result Value Ref Range   WBC 10.5 (A) 4.6 - 10.2 K/uL   Lymph, poc 2.6 0.6 - 3.4   POC LYMPH PERCENT 24.5 10 - 50 %L   MID (cbc) 0.5 0 - 0.9   POC MID % 4.4 0 - 12 %M   POC Granulocyte 7.5 (A) 2 - 6.9   Granulocyte percent 71.1 37 - 80 %G   RBC 5.24 4.69 - 6.13 M/uL   Hemoglobin 15.6 14.1 - 18.1 g/dL   HCT, POC 44.8 43.5 - 53.7 %   MCV 85.5 80 - 97 fL   MCH, POC 29.8 27 - 31.2 pg   MCHC 34.9 31.8 - 35.4 g/dL   RDW, POC 12.3 %   Platelet Count, POC 308 142 - 424 K/uL   MPV 7.1 0 - 99.8 fL   Assessment & Plan:  Nausea and vomiting, intractability of vomiting not specified, unspecified vomiting type - Plan: POCT CBC  Diarrhea of presumed infectious origin - Plan: POCT CBC  Meds ordered this encounter  Medications  . bismuth subsalicylate (PEPTO BISMOL) 262 MG/15ML suspension    Sig: Take 30 mLs by mouth every 6 (six) hours as needed.  . ciprofloxacin (CIPRO) 250 MG tablet    Sig: Take 1 tablet (250 mg total) by mouth 2 (two) times daily.    Dispense:  10 tablet    Refill:  0  . dicyclomine (BENTYL) 10 MG capsule    Sig: Take 2 capsules (20 mg total) by mouth 4 (four) times daily -  before meals and at bedtime. For 3 days    Dispense:  24 capsule    Refill:  0   I have completed the patient encounter in its entirety as documented by the scribe, with editing by me where necessary. Elianne Gubser P. Laney Pastor, M.D.

## 2015-12-04 ENCOUNTER — Other Ambulatory Visit: Payer: Self-pay | Admitting: Physician Assistant

## 2016-02-04 ENCOUNTER — Ambulatory Visit (INDEPENDENT_AMBULATORY_CARE_PROVIDER_SITE_OTHER): Payer: Federal, State, Local not specified - PPO | Admitting: Physician Assistant

## 2016-02-04 VITALS — BP 138/90 | HR 79 | Temp 98.4°F | Resp 16 | Ht 65.0 in | Wt 223.0 lb

## 2016-02-04 DIAGNOSIS — L089 Local infection of the skin and subcutaneous tissue, unspecified: Secondary | ICD-10-CM | POA: Diagnosis not present

## 2016-02-04 DIAGNOSIS — L309 Dermatitis, unspecified: Secondary | ICD-10-CM | POA: Diagnosis not present

## 2016-02-04 MED ORDER — SULFAMETHOXAZOLE-TRIMETHOPRIM 800-160 MG PO TABS: 1.0000 | ORAL_TABLET | Freq: Two times a day (BID) | ORAL | 0 refills | Status: DC

## 2016-02-04 MED ORDER — MUPIROCIN 2 % EX OINT: 1.0000 "application " | TOPICAL_OINTMENT | Freq: Two times a day (BID) | CUTANEOUS | 1 refills | Status: DC

## 2016-02-04 NOTE — Progress Notes (Addendum)
Patient ID: Christopher [name], male   DOB: 1965-10-19, 50 y.o.   MRN: [id number] [facility] [address] [place] [address] [phone]  By signing my name below, I, Christopher [name], attest that this documentation has been prepared under the direction and in the presence of Christopher Drape, PA-C Electronically Signed: Ladene [name], ED Scribe 02/04/2016 at 6:29 PM.  Date:  02/04/2016   Name:  Christopher [name]   DOB:  1966/02/14   MRN:  [id number]  PCP:  Christopher Blinks, MD   History of Present Illness:  Christopher [name] is a 50 y.o. male patient who presents to [facility] complaining of a generalized rash. He noticed a bump that has grown increasingly more painful at his finger. His also noticed small bumps gathering at the left side of his cheek and chin. He denies any fever, nausea, dizziness. There has been no drainage of any of the lesions. He has enough vein to help treated. Patient has a history of MRSA outbreaks. 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 He has attempted a nare's treatment before to decolonize, however may have done for one day.  He recently had a steroid injection for back.  He has a hx of developing a flare up after a prednisone dose pack--4 months ago.   Patient Active Problem List   Diagnosis Date Noted   Sleep apnea 08/31/2015   HTN (hypertension) 08/19/2015   History of MRSA infection 08/19/2015   Psoriasis 08/19/2015   Elevated cholesterol 08/19/2015   Obesity, Class II, BMI 35-39.9 08/19/2015   Chronic back pain 03/25/2015    Past Medical History:  Diagnosis Date   Back pain    MRSA carrier     Past Surgical History:  Procedure Laterality Date   carple tunnel in both hands      CERVICAL FUSION      Social History  Substance Use Topics   Smoking status: Never Smoker   Smokeless tobacco: Never Used   Alcohol use No    Family History  Problem Relation Age of Onset   Hyperlipidemia Father    Cancer  Maternal Grandfather     Allergies  Allergen Reactions   Biaxin [Clarithromycin] Shortness Of Breath    Medication list has been reviewed and updated.  Current Outpatient Prescriptions on File Prior to Visit  Medication Sig Dispense Refill   aspirin 81 MG tablet Take 81 mg by mouth daily.     ibuprofen (ADVIL,MOTRIN) 800 MG tablet Take 1 tablet (800 mg total) by mouth every 8 (eight) hours as needed. 90 tablet 6   losartan (COZAAR) 50 MG tablet Take 1 tablet (50 mg total) by mouth daily. 90 tablet 1   mupirocin ointment (BACTROBAN) 2 % Place 1 application into the nose 2 (two) times daily. 30 g 3   omega-3 acid ethyl esters (LOVAZA) 1 g capsule Take 1 capsule (1 g total) by mouth 2 (two) times daily. 180 capsule 1   travoprost, benzalkonium, (TRAVATAN) 0.004 % ophthalmic solution 1 drop at bedtime.     No current facility-administered medications on file prior to visit.     Review of Systems  Skin: Positive for rash.    Physical Examination: BP 138/90 (BP Location: Right Arm, Patient Position: Sitting, Cuff Size: Normal)    Pulse 79    Temp 98.4 F (36.9 C) (Oral)    Resp 16    Ht 5\' 5"  (1.651 m)    Wt 223 lb (101.2 kg)  SpO2 98%    BMI 37.11 kg/m  Ideal Body Weight: @FLOWAMB [id number]  Physical Exam  Constitutional: He is oriented to person, place, and time. He appears well-developed and well-nourished. No distress.  HENT:  Head: Normocephalic and atraumatic.  Eyes: Conjunctivae and EOM are normal. Pupils are equal, round, and reactive to light.  Cardiovascular: Normal rate.   Pulmonary/Chest: Effort normal. No respiratory distress.  Neurological: He is alert and oriented to person, place, and time.  Skin: Skin is warm and dry. Rash noted. He is not diaphoretic.  Second left phalanx with pustule with surrounding erythema at the between the PIP and DIP.  There is mild swelling surrounding this. Range of motion is somewhat what decreased. There is no lymphangitis.  No drainage from the area.  Erythematous and somewhat open lesions are scant along the mandible particularly on the left side.  No lymphadenopathy detected.   Psychiatric: He has a normal mood and affect. His behavior is normal.    Assessment and Plan: Christopher [name] is a 50 y.o. male who is here today for rash. He was given mupirocin and Bactrim today. I've advised him to take the medication as prescribed. He will allow 48 hours and if this is not improved he will return. I've also advised him to start the mupirocin nares treatment now or within the next 5 days for 10 days of treatment.  Advised of alarming symptoms to warrant a more immediate return.  Dermatitis - Plan: sulfamethoxazole-trimethoprim (BACTRIM DS,SEPTRA DS) 800-160 MG tablet  Skin infection - Plan: sulfamethoxazole-trimethoprim (BACTRIM DS,SEPTRA DS) 800-160 MG tablet   Christopher Drape, PA-C [facility] 02/04/2016 6:29 PM  Wound culture was obtained with deroofing lesion, however this was not ordered, and specimen was disposed without my consent.

## 2016-02-04 NOTE — Patient Instructions (Addendum)
Please apply the mupirocin to the nares twice per day for 5-10 days.  You can start 5 days after you start the antibiotic.  This should be placed the way we discussed.  You can use it topically as well now.  Please take the oral medication as prescribed.  Take with food.   I will have your wound culture shortly.     IF you received an x-ray today, you will receive an invoice from [facility]. Please contact [facility] at [phone] with questions or concerns regarding your invoice.   IF you received labwork today, you will receive an invoice from [facility]. Please contact [facility] at [phone] with questions or concerns regarding your invoice.   Our billing staff will not be able to assist you with questions regarding bills from these companies.  You will be contacted with the lab results as soon as they are available. The fastest way to get your results is to activate your My Chart account. Instructions are located on the last page of this paperwork. If you have not heard from [place] regarding the results in 2 weeks, please contact this office.

## 2016-02-07 ENCOUNTER — Ambulatory Visit (INDEPENDENT_AMBULATORY_CARE_PROVIDER_SITE_OTHER): Payer: Federal, State, Local not specified - PPO | Admitting: Physician Assistant

## 2016-02-07 VITALS — BP 124/80 | HR 85 | Temp 98.9°F | Resp 17 | Ht 66.5 in | Wt 221.0 lb

## 2016-02-07 DIAGNOSIS — L089 Local infection of the skin and subcutaneous tissue, unspecified: Secondary | ICD-10-CM | POA: Diagnosis not present

## 2016-02-07 DIAGNOSIS — M7989 Other specified soft tissue disorders: Secondary | ICD-10-CM

## 2016-02-07 LAB — POCT CBC
Granulocyte percent: 74.3 %G (ref 37–80)
HEMATOCRIT: 43.2 % — AB (ref 43.5–53.7)
HEMOGLOBIN: 15.4 g/dL (ref 14.1–18.1)
LYMPH, POC: 2.2 (ref 0.6–3.4)
MCH, POC: 30.1 pg (ref 27–31.2)
MCHC: 35.7 g/dL — AB (ref 31.8–35.4)
MCV: 84.3 fL (ref 80–97)
MID (CBC): 0.6 (ref 0–0.9)
MPV: 7.1 fL (ref 0–99.8)
POC GRANULOCYTE: 8.3 — AB (ref 2–6.9)
POC LYMPH %: 20 % (ref 10–50)
POC MID %: 5.7 % (ref 0–12)
Platelet Count, POC: 268 10*3/uL (ref 142–424)
RBC: 5.13 M/uL (ref 4.69–6.13)
RDW, POC: 12.4 %
WBC: 11.2 10*3/uL — AB (ref 4.6–10.2)

## 2016-02-07 MED ORDER — DOXYCYCLINE HYCLATE 100 MG PO CAPS: 100.0000 mg | ORAL_CAPSULE | Freq: Two times a day (BID) | ORAL | 0 refills | Status: DC

## 2016-02-07 NOTE — Patient Instructions (Addendum)
  Please elevate the hand as much as possible.  Please take the bactrim and the doxycycline.  Take with food.  You will return tomorrow and see Dr. Carlota Raspberry at 5:00pm  IF you received an x-ray today, you will receive an invoice from [facility]. Please contact [facility] at [phone] with questions or concerns regarding your invoice.   IF you received labwork today, you will receive an invoice from [facility]. Please contact [facility] at [phone] with questions or concerns regarding your invoice.   Our billing staff will not be able to assist you with questions regarding bills from these companies.  You will be contacted with the lab results as soon as they are available. The fastest way to get your results is to activate your My Chart account. Instructions are located on the last page of this paperwork. If you have not heard from [place] regarding the results in 2 weeks, please contact this office.

## 2016-02-07 NOTE — Progress Notes (Signed)
[facility] [address], [place] [address] [phone]  By signing my name below, I, Christopher [name], attest that this documentation has been prepared under the direction and in the presence of Christopher English, PA-C. Electronically Signed: Moises [name], Scribe. 02/07/2016 , 12:05 PM .  Patient was seen in Room 13 .  Date:  02/07/2016   Name:  Christopher [name]   DOB:  28-Nov-1965   MRN:  [id number]  PCP:  Christopher [name]    History of Present Illness: Chief Complaint  Patient presents with   Other    left hand swelling     Christopher [name] is a 50 y.o. male patient who has h/o MRSA infection presents to [facility] complaining of left index finger swelling. He was seen 3 days ago for skin infection. He was prescribed bactrim and mupirocin 2%, but wasn't able to start bactrim until the day after. He's been applying mupirocin 2% to the area. He also bandaged the area to prevent the drainage over his wound. He denies fever or chills. He does feel drained but believes it is due to the pain.   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  Current Outpatient  Prescriptions on File Prior to Visit  Medication Sig Dispense Refill   aspirin 81 MG tablet Take 81 mg by mouth daily.     Famotidine 20 MG CHEW Chew by mouth.     ibuprofen (ADVIL,MOTRIN) 800 MG tablet Take 1 tablet (800 mg total) by mouth every 8 (eight) hours as needed. 90 tablet 6   losartan (COZAAR) 50 MG tablet Take 1 tablet (50 mg total) by mouth daily. 90 tablet 1   mupirocin ointment (BACTROBAN) 2 % Place 1 application into the nose 2 (two) times daily. 30 g 3   mupirocin ointment (BACTROBAN) 2 % Apply 1 application topically 2 (two) times daily. 22 g 1   omega-3 acid ethyl esters (LOVAZA) 1 g capsule Take 1 capsule (1 g total) by mouth 2 (two) times daily. 180 capsule 1   sulfamethoxazole-trimethoprim (BACTRIM DS,SEPTRA DS) 800-160 MG tablet Take 1 tablet by mouth 2 (two) times daily. 20 tablet 0   travoprost, benzalkonium, (TRAVATAN) 0.004 % ophthalmic solution 1 drop at bedtime.     No current facility-administered medications on file prior to visit.     Review of Systems  Constitutional: Positive for malaise/fatigue. Negative for chills, diaphoresis and fever.  Gastrointestinal: Negative for nausea.  Skin: Positive for rash. Negative for itching.  Neurological: Negative for weakness.     Physical Examination: BP 124/80 (  BP Location: Right Arm, Patient Position: Sitting, Cuff Size: Large)    Pulse 85    Temp 98.9 F (37.2 C) (Oral)    Resp 17    Ht 5' 6.5" (1.689 m)    Wt 221 lb (100.2 kg)    SpO2 97%    BMI 35.14 kg/m  Physical Exam  Constitutional: He is oriented to person, place, and time. He appears well-developed and well-nourished. No distress.  HENT:  Head: Normocephalic and atraumatic.  Eyes: Conjunctivae and EOM are normal. Pupils are equal, round, and reactive to light.  Cardiovascular: Normal rate.   Pulmonary/Chest: Effort normal. No respiratory distress.  Musculoskeletal:  Left wrist full ROM without pain  Neurological: He is alert and oriented to  person, place, and time.  Skin: Skin is warm and dry. He is not diaphoretic.  Small pustule with mild surround erythema over proximal phalanx of left 2nd finger   Psychiatric: He has a normal mood and affect. His behavior is normal.   Results for orders placed or performed in visit on 02/07/16  POCT CBC  Result Value Ref Range   WBC 11.2 (A) 4.6 - 10.2 K/uL   Lymph, poc 2.2 0.6 - 3.4   POC LYMPH PERCENT 20.0 10 - 50 %L   MID (cbc) 0.6 0 - 0.9   POC MID % 5.7 0 - 12 %M   POC Granulocyte 8.3 (A) 2 - 6.9   Granulocyte percent 74.3 37 - 80 %G   RBC 5.13 4.69 - 6.13 M/uL   Hemoglobin 15.4 14.1 - 18.1 g/dL   HCT, POC 43.2 (A) 43.5 - 53.7 %   MCV 84.3 80 - 97 fL   MCH, POC 30.1 27 - 31.2 pg   MCHC 35.7 (A) 31.8 - 35.4 g/dL   RDW, POC 12.4 %   Platelet Count, POC 268 142 - 424 K/uL   MPV 7.1 0 - 99.8 fL     Assessment and Plan: Christopher [name] is a 50 y.o. male who is here today for cc of hand swelling. --Advised warm compresses to the area. --Advised to continue the Bactrim. We will then add the doxycycline today. --He will follow-up with Christopher [name] tomorrow. Swelling of left hand - Plan: WOUND CULTURE, POCT CBC, doxycycline (VIBRAMYCIN) 100 MG capsule  Skin infection - Plan: WOUND CULTURE, POCT CBC, doxycycline (VIBRAMYCIN) 100 MG capsule  Christopher Drape, PA-C [facility] 02/07/2016 11:56 AM

## 2016-02-08 ENCOUNTER — Encounter (HOSPITAL_COMMUNITY): Payer: Self-pay | Admitting: Emergency Medicine

## 2016-02-08 ENCOUNTER — Emergency Department (HOSPITAL_COMMUNITY)
Admission: EM | Admit: 2016-02-08 | Discharge: 2016-02-08 | Disposition: A | Discharge status: Home / Self Care | Payer: Federal, State, Local not specified - PPO | Attending: Emergency Medicine | Admitting: Emergency Medicine

## 2016-02-08 ENCOUNTER — Ambulatory Visit (INDEPENDENT_AMBULATORY_CARE_PROVIDER_SITE_OTHER): Payer: Federal, State, Local not specified - PPO | Admitting: Family Medicine

## 2016-02-08 VITALS — BP 124/80 | HR 81 | Temp 98.2°F | Resp 17 | Ht 66.5 in | Wt 222.0 lb

## 2016-02-08 DIAGNOSIS — I10 Essential (primary) hypertension: Secondary | ICD-10-CM | POA: Diagnosis not present

## 2016-02-08 DIAGNOSIS — Z8614 Personal history of Methicillin resistant Staphylococcus aureus infection: Secondary | ICD-10-CM

## 2016-02-08 DIAGNOSIS — L089 Local infection of the skin and subcutaneous tissue, unspecified: Secondary | ICD-10-CM | POA: Diagnosis not present

## 2016-02-08 DIAGNOSIS — Z7982 Long term (current) use of aspirin: Secondary | ICD-10-CM | POA: Diagnosis not present

## 2016-02-08 DIAGNOSIS — M7989 Other specified soft tissue disorders: Secondary | ICD-10-CM | POA: Diagnosis present

## 2016-02-08 DIAGNOSIS — L02512 Cutaneous abscess of left hand: Secondary | ICD-10-CM

## 2016-02-08 DIAGNOSIS — D72829 Elevated white blood cell count, unspecified: Secondary | ICD-10-CM

## 2016-02-08 DIAGNOSIS — IMO0001 Reserved for inherently not codable concepts without codable children: Secondary | ICD-10-CM

## 2016-02-08 DIAGNOSIS — Z79899 Other long term (current) drug therapy: Secondary | ICD-10-CM | POA: Diagnosis not present

## 2016-02-08 LAB — POCT CBC
GRANULOCYTE PERCENT: 73.2 % (ref 37–80)
HCT, POC: 44.5 % (ref 43.5–53.7)
Hemoglobin: 15.5 g/dL (ref 14.1–18.1)
Lymph, poc: 2.8 (ref 0.6–3.4)
MCH, POC: 29.4 pg (ref 27–31.2)
MCHC: 34.9 g/dL (ref 31.8–35.4)
MCV: 84.2 fL (ref 80–97)
MID (CBC): 0.5 (ref 0–0.9)
MPV: 7.1 fL (ref 0–99.8)
PLATELET COUNT, POC: 325 10*3/uL (ref 142–424)
POC Granulocyte: 9 — AB (ref 2–6.9)
POC LYMPH %: 22.6 % (ref 10–50)
POC MID %: 4.2 %M (ref 0–12)
RBC: 5.28 M/uL (ref 4.69–6.13)
RDW, POC: 12.6 %
WBC: 12.3 10*3/uL — AB (ref 4.6–10.2)

## 2016-02-08 MED ORDER — HYDROCODONE-ACETAMINOPHEN 5-325 MG PO TABS: 1.0000 | ORAL_TABLET | Freq: Four times a day (QID) | ORAL | 0 refills | Status: DC | PRN

## 2016-02-08 NOTE — Progress Notes (Signed)
Subjective:  By signing my name below, I, Moises Blood, attest that this documentation has been prepared under the direction and in the presence of Merri Ray, MD. Electronically Signed: Moises Blood, [place]. 02/08/2016 , 6:02 PM .  Patient was seen in Room 12 .   Patient ID: Christopher [name], male    DOB: Feb 01, 1966, 50 y.o.   MRN: [id number] Chief Complaint  Patient presents with  . Follow-up    INFECTION IN HAND    HPI Christopher [name] is a 50 y.o. male Follow up form visit yesterday with Ivar Drape, PA-C with left hand cellulitis, wound over proximal phalanx of left 2nd finger. He was initially seen on Sept 8th, Small pustule/papule, lifted with needle, started on septra bid. He was seen yesterday with increased swelling; wound culture obtained yesterday. Doxycycline was added yesterday. He has a history of MRSA. Wound culture from yesterday without organisms at this time. CBC showed WBC at 11.2 yesterday.  Patient states he's feeling a little better but the wound is still draining with some pain. He's able to move it better today compared to yesterday. He's been keeping a bandage over the area. He denies fever, chills or sweats at night.   Patient Active Problem List   Diagnosis Date Noted  . Sleep apnea 08/31/2015  . HTN (hypertension) 08/19/2015  . History of MRSA infection 08/19/2015  . Psoriasis 08/19/2015  . Elevated cholesterol 08/19/2015  . Obesity, Class II, BMI 35-39.9 08/19/2015  . Chronic back pain 03/25/2015   Past Medical History:  Diagnosis Date  . Back pain   . MRSA carrier    Past Surgical History:  Procedure Laterality Date  . 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 CERVICAL FUSION     Allergies  Allergen Reactions  . Biaxin [Clarithromycin] Shortness Of Breath   Prior to Admission medications   Medication Sig Start Date End Date Taking? Authorizing Provider  aspirin 81 MG tablet Take 81 mg by mouth daily.   Yes Historical Provider, MD    doxycycline (VIBRAMYCIN) 100 MG capsule Take 1 capsule (100 mg total) by mouth 2 (two) times daily. 02/07/16  Yes Dorian Heckle English, PA  Famotidine 20 MG CHEW Chew by mouth.   Yes Historical Provider, MD  ibuprofen (ADVIL,MOTRIN) 800 MG tablet Take 1 tablet (800 mg total) by mouth every 8 (eight) hours as needed. 04/15/15  Yes Gay Filler Copland, MD  losartan (COZAAR) 50 MG tablet Take 1 tablet (50 mg total) by mouth daily. 08/19/15  Yes Mancel Bale, PA-C  mupirocin ointment (BACTROBAN) 2 % Place 1 application into the nose 2 (two) times daily. 08/19/15  Yes Mancel Bale, PA-C  mupirocin ointment (BACTROBAN) 2 % Apply 1 application topically 2 (two) times daily. 02/04/16  Yes Stephanie D English, PA  omega-3 acid ethyl esters (LOVAZA) 1 g capsule Take 1 capsule (1 g total) by mouth 2 (two) times daily. 08/19/15  Yes Mancel Bale, PA-C  sulfamethoxazole-trimethoprim (BACTRIM DS,SEPTRA DS) 800-160 MG tablet Take 1 tablet by mouth 2 (two) times daily. 02/04/16  Yes Stephanie D English, PA  travoprost, benzalkonium, (TRAVATAN) 0.004 % ophthalmic solution 1 drop at bedtime.   Yes Historical Provider, MD   Social History   Social History  . Marital status: Married    Spouse name: N/A  . Number of children: N/A  . Years of education: N/A   Occupational History  . Not on file.   Social History Main Topics  .  Smoking status: Never Smoker  . Smokeless tobacco: Never Used  . Alcohol use No  . Drug use: No  . Sexual activity: Not on file   Other Topics Concern  . Not on file   Social History Narrative  . No narrative on file   Review of Systems  Constitutional: Negative for chills, diaphoresis, fatigue and fever.  Gastrointestinal: Negative for diarrhea, nausea and vomiting.  Musculoskeletal: Positive for joint swelling and myalgias. Negative for arthralgias.  Skin: Positive for wound. Negative for rash.  Neurological: Negative for weakness and numbness.       Objective:   Physical  Exam  Constitutional: He is oriented to person, place, and time. He appears well-developed and well-nourished. No distress.  HENT:  Head: Normocephalic and atraumatic.  Eyes: EOM are normal. Pupils are equal, round, and reactive to light.  Neck: Neck supple.  Cardiovascular: Normal rate.   Pulmonary/Chest: Effort normal. No respiratory distress.  Musculoskeletal: Normal range of motion.  Left hand, 2nd phalanx: flexion limited to about 90 degrees at the PIP  Neurological: He is alert and oriented to person, place, and time.  Skin: Skin is warm and dry.  Some yellow exudate expressed from dorsal wound on the left 2nd phalanx with surrounding soft tissue swelling and erythema; outline of the erythema from yesterday has somewhat resided, but some increased erythema into the fingers  Psychiatric: He has a normal mood and affect. His behavior is normal.  Nursing note and vitals reviewed.   Vitals:   02/08/16 1704  BP: 124/80  Pulse: 81  Resp: 17  Temp: 98.2 F (36.8 C)  TempSrc: Oral  SpO2: 97%  Weight: 222 lb (100.7 kg)  Height: 5' 6.5" (1.689 m)   Results for orders placed or performed in visit on 02/08/16  POCT CBC  Result Value Ref Range   WBC 12.3 (A) 4.6 - 10.2 K/uL   Lymph, poc 2.8 0.6 - 3.4   POC LYMPH PERCENT 22.6 10 - 50 %L   MID (cbc) 0.5 0 - 0.9   POC MID % 4.2 0 - 12 %M   POC Granulocyte 9.0 (A) 2 - 6.9   Granulocyte percent 73.2 37 - 80 %G   RBC 5.28 4.69 - 6.13 M/uL   Hemoglobin 15.5 14.1 - 18.1 g/dL   HCT, POC 44.5 43.5 - 53.7 %   MCV 84.2 80 - 97 fL   MCH, POC 29.4 27 - 31.2 pg   MCHC 34.9 31.8 - 35.4 g/dL   RDW, POC 12.6 %   Platelet Count, POC 325 142 - 424 K/uL   MPV 7.1 0 - 99.8 fL       Assessment & Plan:   Christopher [name] is a 50 y.o. male Abscess of second finger of left hand - Plan: POCT CBC  History of MRSA infection  Abscess over left second proximal phalanx.  Seen by Ms. English 4 days ago with reported small papule versus shallow  pustule on dorsum of the finger that was lifted with needle, but culture was not able to be obtained. He was started on Septra for possible early MRSA infection as other areas of body with rash at that time, and reported history of MRSA.  - Some increased swelling and erythema into hand yesterday, new culture obtained.  With exudate suppressed, concern of persistent MRSA infection or possible Septra resistant MRSA, so added doxycycline.  - Erythema in proximal hand decreased today, swelling into dorsal hand decreased, but persistent, possible slight increased  swelling into proximal phalynx with more exudate expression tonight, suspected deeper abscess. Discussed with hand surgeon. Will have patient evaluated through emergency room for likely I&D tonight.  Charge nurse at [facility] ER was advised.  No orders of the defined types were placed in this encounter.  Patient Instructions       IF you received an x-ray today, you will receive an invoice from [facility]. Please contact [facility] at [phone] with questions or concerns regarding your invoice.   IF you received labwork today, you will receive an invoice from [employer]. Please contact [facility] at [phone] with questions or concerns regarding your invoice.   Our billing staff will not be able to assist you with questions regarding bills from these companies.  You will be contacted with the lab results as soon as they are available. The fastest way to get your results is to activate your My Chart account. Instructions are located on the last page of this paperwork. If you have not heard from [place] regarding the results in 2 weeks, please contact this office.        I personally performed the services described in this documentation, which was scribed in my presence. The recorded information has been reviewed and considered, and addended by me as needed.   Signed,   Merri Ray,  MD [facility].  02/08/16 7:04 PM

## 2016-02-08 NOTE — Consult Note (Signed)
Reason for Consult: Infection left index finger Dr. Nyoka Cowden urgent care referred Referring Physician: Dr. Newell Coral Breech is an 50 y.o. male.  HPI: 50 year old with a history of MRSA who presents with a 4-5 day history of infection about left index finger. This area was addressed at the urgent care outlined in his chart.  He is noted a failure to progress in terms of resolution and I was asked to see him tonight.  I spoke with Dr. Nyoka Cowden and asked that he be evaluated urgently due to the failure of resolution.  Patient notes no other pain complaints at present time. He does note a recent lumbar injection. He also notes he has a history of MRSA primarily affecting the facial region.  Past Medical History:  Diagnosis Date  . Back pain   . MRSA carrier     Past Surgical History:  Procedure Laterality Date  . 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 CERVICAL FUSION      Family History  Problem Relation Age of Onset  . Hyperlipidemia Father   . Cancer Maternal Grandfather     Social History:  reports that he has never smoked. He has never used smokeless tobacco. He reports that he does not drink alcohol or use drugs.  Allergies:  Allergies  Allergen Reactions  . Biaxin [Clarithromycin] Shortness Of Breath    Medications: I have reviewed the patient's current medications.  Results for orders placed or performed in visit on 02/08/16 (from the past 48 hour(s))  POCT CBC     Status: Abnormal   Collection Time: 02/08/16  6:30 PM  Result Value Ref Range   WBC 12.3 (A) 4.6 - 10.2 K/uL   Lymph, poc 2.8 0.6 - 3.4   POC LYMPH PERCENT 22.6 10 - 50 %L   MID (cbc) 0.5 0 - 0.9   POC MID % 4.2 0 - 12 %M   POC Granulocyte 9.0 (A) 2 - 6.9   Granulocyte percent 73.2 37 - 80 %G   RBC 5.28 4.69 - 6.13 M/uL   Hemoglobin 15.5 14.1 - 18.1 g/dL   HCT, POC 44.5 43.5 - 53.7 %   MCV 84.2 80 - 97 fL   MCH, POC 29.4 27 - 31.2 pg   MCHC 34.9 31.8 - 35.4 g/dL   RDW, POC 12.6 %   Platelet Count,  POC 325 142 - 424 K/uL   MPV 7.1 0 - 99.8 fL    No results found.  Review of Systems  Eyes: Negative.   Cardiovascular: Negative.   Genitourinary: Negative.   Skin:       History of MRSA infections  Neurological: Negative.   Endo/Heme/Allergies: Negative.    Blood pressure (!) 155/107, pulse 82, temperature 98.4 F (36.9 C), temperature source Oral, resp. rate 18, height 5\' 5"  (1.651 m), weight 100.2 kg (221 lb), SpO2 98 %. Physical Exam white male alert and oriented in no acute distress fossae stable he has left index finger infection with abscess. This is dorsal in nature. Flexor apparatus is intact extensor apparatus is painful no evidence of obvious bony derangement. I reviewed this with him at length and the findings.  The patient is alert and oriented in no acute distress. The patient complains of pain in the affected upper extremity.  The patient is noted to have a normal HEENT exam. Lung fields show equal chest expansion and no shortness of breath. Abdomen exam is nontender without distention. Lower extremity examination does not  show any fracture dislocation or blood clot symptoms. Pelvis is stable and the neck and back are stable and nontender.  Assessment/Plan: Infection left index finger with focal abscess  We are planning surgery for your upper extremity. The risk and benefits of surgery to include risk of bleeding, infection, anesthesia,  damage to normal structures and failure of the surgery to accomplish its intended goals of relieving symptoms and restoring function have been discussed in detail. With this in mind we plan to proceed. I have specifically discussed with the patient the pre-and postoperative regime and the dos and don'ts and risk and benefits in great detail. Risk and benefits of surgery also include risk of dystrophy(CRPS), chronic nerve pain, failure of the healing process to go onto completion and other inherent risks of surgery The relavent the  pathophysiology of the disease/injury process, as well as the alternatives for treatment and postoperative course of action has been discussed in great detail with the patient who desires to proceed.  We will do everything in our power to help you (the patient) restore function to the upper extremity. It is a pleasure to see this patient today.   I've consented patient for irrigation and debridement of a deep abscess with extensor tendon lysis tenosynovitis me.  Procedure patient was seen and taken to the procedure suite  emergency room. He had 15 mL of lidocaine without epinephrine placed in the form of the block. Following this I prepped him with 2 separate Betadine scrub and paint followed by isolation of the sterile field. At this time I then performed incision and irrigation and debridement of skin subcutaneous tissue and tendon about the left index finger. Cultures were taken of an obvious abscess. This was a infectious accumulation. Following this I then performed extensor tendon tendon lysis tenosynovitis me about left index finger up to and just proximal to the MCP joint.  We then performed very careful and cautious irrigation followed by packing of the wound. The procedure was tolerated well.  This was a irrigation and debridement of deep abscess and extensor to lysis tenosynovitis me about the left index finger.  He will continue doxycycline and Bactrim. He will continue and I work status until we A for work.  Norco written for pain I'll call him in the morning will range whirlpool and close follow-up in my office with wet-to-dry dressing changes.  All questions have been encouraged and answered. Paulene Floor 02/08/2016, 9:47 PM

## 2016-02-08 NOTE — ED Notes (Signed)
Patient to see Dr. Amedeo Plenty. MD paged.

## 2016-02-08 NOTE — ED Triage Notes (Signed)
Pt arrives with c/o swelling to L first finger, seen at [facility] and sent to ED for drainage. Seen last week for abscess to same digit. States needs to be seen by hand specialist. Taking doxycycline and bactrim for abscess.

## 2016-02-08 NOTE — Discharge Instructions (Signed)
Keep the area clean and dry. Dr. Amedeo Plenty will call you to tell you what time to return to the office,

## 2016-02-08 NOTE — Patient Instructions (Addendum)
Your infection fighting cells are somewhat elevated from yesterday, and with the pus that is being expressed, suspect there is a deeper abscess involved. Go to [facility] emergency room tonight for evaluation with hand surgeon as you will likely need that area opened tonight.  Emergency [facility] [place], [place] [address]   IF you received an x-ray today, you will receive an invoice from [facility]. Please contact [facility] at [phone] with questions or concerns regarding your invoice.   IF you received labwork today, you will receive an invoice from [employer]. Please contact [facility] at [phone] with questions or concerns regarding your invoice.   Our billing staff will not be able to assist you with questions regarding bills from these companies.  You will be contacted with the lab results as soon as they are available. The fastest way to get your results is to activate your My Chart account. Instructions are located on the last page of this paperwork. If you have not heard from [place] regarding the results in 2 weeks, please contact this office.

## 2016-02-08 NOTE — ED Provider Notes (Signed)
[facility] DEPT Provider Note   CSN: [id number] Arrival date & time: 02/08/16  2017  By signing my name below, I, [name], attest that this documentation has been prepared under the direction and in the presence of non-physician practitioner, [name], NP. Electronically Signed: [name], Scribe. 02/08/2016. 8:59 PM.  History   Chief Complaint No chief complaint on file.  The history is provided by the patient and medical records. No language interpreter was used.   HPI Comments: Christopher [name] is a 50 y.o. male who presents to the Emergency Department complaining of a constant moderate left index finger swelling. He suspects a bodily reaction to a recent steroid injection. He was seen last week for an abscess to the same area. He saw his doctor today, yesterday, and 3 days ago for the same swelling. He is currently on doxycycline and bactrim. Denies any other complaint.    Past Medical History:  Diagnosis Date  . Back pain   . MRSA carrier     Patient Active Problem List   Diagnosis Date Noted  . Sleep apnea 08/31/2015  . HTN (hypertension) 08/19/2015  . History of MRSA infection 08/19/2015  . Psoriasis 08/19/2015  . Elevated cholesterol 08/19/2015  . Obesity, Class II, BMI 35-39.9 08/19/2015  . Chronic back pain 03/25/2015    Past Surgical History:  Procedure Laterality Date  . 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 CERVICAL FUSION         Home Medications    Prior to Admission medications   Medication Sig Start Date End Date Taking? Authorizing Provider  aspirin 81 MG tablet Take 81 mg by mouth daily.    Historical Provider, MD  doxycycline (VIBRAMYCIN) 100 MG capsule Take 1 capsule (100 mg total) by mouth 2 (two) times daily. 02/07/16   Dorian Heckle English, PA  Famotidine 20 MG CHEW Chew by mouth.    Historical Provider, MD  HYDROcodone-acetaminophen (NORCO) 5-325 MG tablet Take 1 tablet by mouth every 6 (six) hours as needed for moderate pain.  02/08/16   [name], NP  ibuprofen (ADVIL,MOTRIN) 800 MG tablet Take 1 tablet (800 mg total) by mouth every 8 (eight) hours as needed. 04/15/15   Darreld Mclean, MD  losartan (COZAAR) 50 MG tablet Take 1 tablet (50 mg total) by mouth daily. 08/19/15   Mancel Bale, PA-C  mupirocin ointment (BACTROBAN) 2 % Place 1 application into the nose 2 (two) times daily. 08/19/15   Mancel Bale, PA-C  mupirocin ointment (BACTROBAN) 2 % Apply 1 application topically 2 (two) times daily. 02/04/16   Dorian Heckle English, PA  omega-3 acid ethyl esters (LOVAZA) 1 g capsule Take 1 capsule (1 g total) by mouth 2 (two) times daily. 08/19/15   Mancel Bale, PA-C  sulfamethoxazole-trimethoprim (BACTRIM DS,SEPTRA DS) 800-160 MG tablet Take 1 tablet by mouth 2 (two) times daily. 02/04/16   Dorian Heckle English, PA  travoprost, benzalkonium, (TRAVATAN) 0.004 % ophthalmic solution 1 drop at bedtime.    Historical Provider, MD    Family History Family History  Problem Relation Age of Onset  . Hyperlipidemia Father   . Cancer Maternal Grandfather     Social History Social History  Substance Use Topics  . Smoking status: Never Smoker  . Smokeless tobacco: Never Used  . Alcohol use No     Allergies   Biaxin [clarithromycin]   Review of Systems Review of Systems  Constitutional: Negative for fever.  Musculoskeletal: Positive for  joint swelling.  all other systems negataive   Physical Exam Updated Vital Signs BP (!) 155/107 (BP Location: Right Arm)   Pulse 82   Temp 98.4 F (36.9 C) (Oral)   Resp 18   Ht 5\' 5"  (1.651 m)   Wt 221 lb (100.2 kg)   SpO2 98%   BMI 36.78 kg/m   Physical Exam  Constitutional: He appears well-developed and well-nourished. No distress.  HENT:  Head: Normocephalic and atraumatic.  Eyes: Conjunctivae are normal.  Neck: Neck supple.  Cardiovascular: Normal rate.   Pulmonary/Chest: Effort normal.  Abdominal: Soft.  Musculoskeletal: Normal range of motion.  Area to the  dorsum to the left index finger below the PIP with swelling and pustular draining area. Redness extends to dorsum of the hand. Radial pulse is +2. Adequate circulation. Intact sensation to touch.   Neurological: He is alert.  Skin: Skin is warm and dry.  Psychiatric: He has a normal mood and affect.  Nursing note and vitals reviewed.    ED Treatments / Results  DIAGNOSTIC STUDIES: Oxygen Saturation is 98% on RA, normal by my interpretation.    COORDINATION OF CARE: 10:47 PM- Pt advised of plan for treatment and pt agrees.  Labs (all labs ordered are listed, but only abnormal results are displayed) Labs Reviewed  AEROBIC/ANAEROBIC CULTURE (SURGICAL/DEEP WOUND)    Radiology No results found.  Procedures Procedures (including critical care time)  Medications Ordered in ED Medications - No data to display   Initial Impression / Assessment and Plan / ED Course  I have reviewed the triage vital signs and the nursing notes.  Clinical Course   Consult with Dr. Amedeo Plenty and he will see the patient in the ED.  Final Clinical Impressions(s) / ED Diagnoses   Final diagnoses:  Infected finger   Dr. Amedeo Plenty here to I&D the infected finger. Patient to continue his current antibiotics. He will f/u in Dr. Vanetta Shawl office.   New Prescriptions Discharge Medication List as of 02/08/2016  9:47 PM    START taking these medications   Details  HYDROcodone-acetaminophen (NORCO) 5-325 MG tablet Take 1 tablet by mouth every 6 (six) hours as needed for moderate pain., Starting Tue 02/08/2016, Print       I personally performed the services described in this documentation, which was scribed in my presence. The recorded information has been reviewed and is accurate.     [place], NP 02/09/16 [place], MD 02/11/16 [phone]

## 2016-02-08 NOTE — ED Notes (Signed)
MD Gramig at bedside

## 2016-02-10 LAB — WOUND CULTURE
GRAM STAIN: NONE SEEN
GRAM STAIN: NONE SEEN

## 2016-02-13 LAB — AEROBIC/ANAEROBIC CULTURE (SURGICAL/DEEP WOUND)

## 2016-02-13 LAB — AEROBIC/ANAEROBIC CULTURE W GRAM STAIN (SURGICAL/DEEP WOUND)

## 2016-02-14 ENCOUNTER — Telehealth (HOSPITAL_BASED_OUTPATIENT_CLINIC_OR_DEPARTMENT_OTHER): Payer: Self-pay | Admitting: Emergency Medicine

## 2016-02-14 NOTE — Telephone Encounter (Signed)
Post ED Visit - Positive Culture Follow-up  Culture report reviewed by antimicrobial stewardship pharmacist:  []  Elenor Quinones, Pharm.D. []  Heide Guile, Pharm.D., BCPS []  Parks Neptune, Pharm.D. []  Alycia Rossetti, Pharm.D., BCPS []  [place], Pharm.D., BCPS, AAHIVP []  Legrand Como, Pharm.D., BCPS, AAHIVP []  Milus Glazier, Pharm.D. []  Stephens November, Pharm.D. Dimitri Ped PharmD  Positive wound culture Treated with Doxycycline and Bactrim, organism sensitive to the same and no further patient follow-up is required at this time.  Hazle Nordmann 02/14/2016, 10:30 AM

## 2016-02-17 ENCOUNTER — Telehealth: Payer: Self-pay

## 2016-02-17 NOTE — Telephone Encounter (Signed)
PATIENT STATES HE WAS IN THE OFFICE TO SEE STEPHANIE ENGLISH ABOUT 2 WEEKS AGO AND SHE DID A CULTURE SWAB ON HIS FINGER. HE HAS NEVER GOTTEN THE RESULSTS. BEST PHONE [phone] (CELL)  [place].  [place]

## 2016-02-18 NOTE — Telephone Encounter (Signed)
Alerted patient of the results.  The documentation appeared as if he was alerted, and that he has had continued care for hand.  He states that this is improving.

## 2016-02-29 ENCOUNTER — Other Ambulatory Visit: Payer: Self-pay | Admitting: Physician Assistant

## 2016-02-29 DIAGNOSIS — E7800 Pure hypercholesterolemia, unspecified: Secondary | ICD-10-CM

## 2016-03-17 ENCOUNTER — Other Ambulatory Visit: Payer: Self-pay | Admitting: Emergency Medicine

## 2016-03-17 MED ORDER — FAMOTIDINE 20 MG PO CHEW: 1.0000 | CHEWABLE_TABLET | Freq: Two times a day (BID) | ORAL | 2 refills | Status: DC

## 2016-04-10 ENCOUNTER — Other Ambulatory Visit: Payer: Self-pay | Admitting: Emergency Medicine

## 2016-04-10 ENCOUNTER — Telehealth: Payer: Self-pay | Admitting: Family Medicine

## 2016-04-10 MED ORDER — FAMOTIDINE 20 MG PO TABS: 20.0000 mg | ORAL_TABLET | Freq: Two times a day (BID) | ORAL | 2 refills | Status: DC

## 2016-04-10 NOTE — Telephone Encounter (Signed)
New rx for Famotidine 20 MG TABLETS sent to pharmacy.

## 2016-04-10 NOTE — Telephone Encounter (Signed)
[phone] called in because she says that they received a Rx for Famotidine chewable 20 MG. She says that they do not have chewables and would like to have approval from provider to give pt the regular tablet. She says if so she would like to have a new Rx to fill for pt.

## 2016-04-11 ENCOUNTER — Other Ambulatory Visit: Payer: Self-pay | Admitting: Physician Assistant

## 2016-04-11 DIAGNOSIS — I10 Essential (primary) hypertension: Secondary | ICD-10-CM

## 2016-04-16 ENCOUNTER — Other Ambulatory Visit: Payer: Self-pay | Admitting: Physician Assistant

## 2016-05-12 ENCOUNTER — Ambulatory Visit (INDEPENDENT_AMBULATORY_CARE_PROVIDER_SITE_OTHER): Payer: Federal, State, Local not specified - PPO | Admitting: Physician Assistant

## 2016-05-12 VITALS — BP 122/72 | HR 84 | Temp 98.6°F | Resp 17 | Ht 66.5 in | Wt 221.0 lb

## 2016-05-12 DIAGNOSIS — E669 Obesity, unspecified: Secondary | ICD-10-CM | POA: Diagnosis not present

## 2016-05-12 DIAGNOSIS — I10 Essential (primary) hypertension: Secondary | ICD-10-CM | POA: Diagnosis not present

## 2016-05-12 DIAGNOSIS — G8929 Other chronic pain: Secondary | ICD-10-CM | POA: Diagnosis not present

## 2016-05-12 DIAGNOSIS — R12 Heartburn: Secondary | ICD-10-CM

## 2016-05-12 DIAGNOSIS — M545 Low back pain: Secondary | ICD-10-CM

## 2016-05-12 DIAGNOSIS — L409 Psoriasis, unspecified: Secondary | ICD-10-CM | POA: Diagnosis not present

## 2016-05-12 DIAGNOSIS — E7800 Pure hypercholesterolemia, unspecified: Secondary | ICD-10-CM | POA: Diagnosis not present

## 2016-05-12 DIAGNOSIS — Z8614 Personal history of Methicillin resistant Staphylococcus aureus infection: Secondary | ICD-10-CM

## 2016-05-12 MED ORDER — LIRAGLUTIDE -WEIGHT MANAGEMENT 18 MG/3ML ~~LOC~~ SOPN: 0.6000 mg | PEN_INJECTOR | Freq: Every day | SUBCUTANEOUS | 0 refills | Status: DC

## 2016-05-12 MED ORDER — MUPIROCIN 2 % EX OINT: 1.0000 "application " | TOPICAL_OINTMENT | Freq: Two times a day (BID) | CUTANEOUS | 3 refills | Status: DC

## 2016-05-12 MED ORDER — CYCLOBENZAPRINE HCL 5 MG PO TABS: 5.0000 mg | ORAL_TABLET | Freq: Three times a day (TID) | ORAL | 0 refills | Status: DC

## 2016-05-12 MED ORDER — IBUPROFEN 800 MG PO TABS: 800.0000 mg | ORAL_TABLET | Freq: Three times a day (TID) | ORAL | 0 refills | Status: DC | PRN

## 2016-05-12 MED ORDER — FAMOTIDINE 20 MG PO TABS: 20.0000 mg | ORAL_TABLET | Freq: Two times a day (BID) | ORAL | 3 refills | Status: DC

## 2016-05-12 MED ORDER — LOSARTAN POTASSIUM 50 MG PO TABS: 50.0000 mg | ORAL_TABLET | Freq: Every day | ORAL | 1 refills | Status: DC

## 2016-05-12 MED ORDER — OMEGA-3-ACID ETHYL ESTERS 1 G PO CAPS: 2.0000 g | ORAL_CAPSULE | Freq: Two times a day (BID) | ORAL | 0 refills | Status: DC

## 2016-05-12 MED ORDER — CALCIPOTRIENE 0.005 % EX CREA: TOPICAL_CREAM | Freq: Two times a day (BID) | CUTANEOUS | 0 refills | Status: DC

## 2016-05-12 NOTE — Patient Instructions (Addendum)
  Sign mychart  I will contact you with your lab results as soon as they are available.   If you have not heard from me in 2 weeks, please contact me.  The fastest way to get your results is to register for My Chart (see the instructions on the last page of this printout).    IF you received an x-ray today, you will receive an invoice from [facility]. Please contact [facility] at [phone] with questions or concerns regarding your invoice.   IF you received labwork today, you will receive an invoice from [facility]. Please contact [facility] at [phone] with questions or concerns regarding your invoice.   Our billing staff will not be able to assist you with questions regarding bills from these companies.  You will be contacted with the lab results as soon as they are available. The fastest way to get your results is to activate your My Chart account. Instructions are located on the last page of this paperwork. If you have not heard from [place] regarding the results in 2 weeks, please contact this office.

## 2016-05-12 NOTE — Progress Notes (Signed)
Christopher [name]  MRN: [id number] DOB: 08/24/1965  Subjective:  Pt presents to clinic for medication refill. He was told by the pharmacy that he has to be seen before he could get more refills.  He is doing well on his medication.  He is walking about 1-2 miles a day but at a slow pace because of his chronic back pain.  He sees a back specialist who is doing injections but wants to hold off on surgery until the last minute.  He is still interested in weight loss but exercise is just to hard for him.  He was on Atorvastatin while living in [place] but he stopped it because it made the muscles in his back hurt to much.  Review of Systems  Respiratory: Negative for cough and shortness of breath.   Cardiovascular: Negative for chest pain, palpitations and leg swelling.    Patient Active Problem List   Diagnosis Date Noted  . Sleep apnea 08/31/2015  . HTN (hypertension) 08/19/2015  . History of MRSA infection - using bactroban prn 08/19/2015  . Psoriasis - use cream as needed 08/19/2015  . Elevated cholesterol -  08/19/2015  . Obesity, Class II, BMI 35-39.9 08/19/2015  . Chronic back pain - has a specialist 03/25/2015    Current Outpatient Prescriptions on File Prior to Visit  Medication Sig Dispense Refill  . aspirin 81 MG tablet Take 81 mg by mouth daily.    Marland Kitchen HYDROcodone-acetaminophen (NORCO) 5-325 MG tablet Take 1 tablet by mouth every 6 (six) hours as needed for moderate pain. 20 tablet 0  . travoprost, benzalkonium, (TRAVATAN) 0.004 % ophthalmic solution 1 drop at bedtime.     No current facility-administered medications on file prior to visit.     Allergies  Allergen Reactions  . Biaxin [Clarithromycin] Shortness Of Breath    Pt patients past, family and social history were reviewed and updated.   Objective:  BP 122/72 (BP Location: Right Arm, Patient Position: Sitting, Cuff Size: Normal)   Pulse 84   Temp 98.6 F (37 C) (Oral)   Resp 17   Ht 5' 6.5" (1.689 m)   Wt  221 lb (100.2 kg)   SpO2 99%   BMI 35.14 kg/m   Physical Exam  Constitutional: He is oriented to person, place, and time and well-developed, well-nourished, and in no distress.  HENT:  Head: Normocephalic and atraumatic.  Right Ear: External ear normal.  Left Ear: External ear normal.  Eyes: Conjunctivae are normal.  Neck: Normal range of motion.  Cardiovascular: Normal rate, regular rhythm and normal heart sounds.   No murmur heard. Pulmonary/Chest: Effort normal and breath sounds normal. He has no wheezes.  Neurological: He is alert and oriented to person, place, and time. Gait normal.  Skin: Skin is warm and dry.  Psychiatric: Mood, memory, affect and judgment normal.   Spent 30 mins with pt >50% in counseling Assessment and Plan :  Heartburn - Plan: famotidine (PEPCID) 20 MG tablet - continue current medications  Elevated cholesterol - Plan: Lipid panel, omega-3 acid ethyl esters (LOVAZA) 1 g capsule - d/w pt depending on his current lab results fibric acid or another statin but something should be done to decrease his triglycerides  History of MRSA infection - Plan: mupirocin ointment (BACTROBAN) 2 % - continue to use prn  Essential hypertension - Plan: CMP14+EGFR, losartan (COZAAR) 50 MG tablet - well controlled  Chronic midline low back pain without sciatica - Plan: cyclobenzaprine (FLEXERIL) 5 MG tablet, ibuprofen (ADVIL,MOTRIN)  800 MG tablet - continue care with the specialist  Psoriasis - Plan: calcipotriene (DOVONOX) 0.005 % cream - use as needed  Obesity, Class II, BMI 35-39.9 - Plan: Liraglutide -Weight Management (SAXENDA) 18 MG/3ML SOPN - trial of this medication - he will recheck with me in a month to make sure he is tolerating - will likely increase his dose to 1.73m in a week once his labs return - d/w pt how to use and what to expect - weight loss will help his cholesterol as well as decrease the strain on his back  SWindell HummingbirdPA-C  [facility] 05/12/2016 9:21 AM

## 2016-05-13 LAB — CMP14+EGFR
A/G RATIO: 1.9 (ref 1.2–2.2)
ALBUMIN: 4.5 g/dL (ref 3.5–5.5)
ALK PHOS: 91 IU/L (ref 39–117)
ALT: 92 IU/L — ABNORMAL HIGH (ref 0–44)
AST: 40 IU/L (ref 0–40)
BILIRUBIN TOTAL: 0.2 mg/dL (ref 0.0–1.2)
BUN / CREAT RATIO: 12 (ref 9–20)
BUN: 11 mg/dL (ref 6–24)
CHLORIDE: 104 mmol/L (ref 96–106)
CO2: 22 mmol/L (ref 18–29)
Calcium: 9.3 mg/dL (ref 8.7–10.2)
Creatinine, Ser: 0.94 mg/dL (ref 0.76–1.27)
GFR calc non Af Amer: 95 mL/min/{1.73_m2} (ref >59)
GFR, EST AFRICAN AMERICAN: 110 mL/min/{1.73_m2} (ref >59)
GLUCOSE: 89 mg/dL (ref 65–99)
Globulin, Total: 2.4 g/dL (ref 1.5–4.5)
POTASSIUM: 4.7 mmol/L (ref 3.5–5.2)
Sodium: 144 mmol/L (ref 134–144)
TOTAL PROTEIN: 6.9 g/dL (ref 6.0–8.5)

## 2016-05-13 LAB — LIPID PANEL
CHOLESTEROL TOTAL: 209 mg/dL — AB (ref 100–199)
Chol/HDL Ratio: 5.1 ratio units — ABNORMAL HIGH (ref 0.0–5.0)
HDL: 41 mg/dL (ref >39)
LDL Calculated: 146 mg/dL — ABNORMAL HIGH (ref 0–99)
Triglycerides: 109 mg/dL (ref 0–149)
VLDL Cholesterol Cal: 22 mg/dL (ref 5–40)

## 2016-07-04 ENCOUNTER — Encounter: Payer: Self-pay | Admitting: Gastroenterology

## 2016-07-17 ENCOUNTER — Other Ambulatory Visit: Payer: Self-pay | Admitting: Physician Assistant

## 2016-07-17 DIAGNOSIS — M545 Low back pain, unspecified: Secondary | ICD-10-CM

## 2016-07-17 DIAGNOSIS — G8929 Other chronic pain: Secondary | ICD-10-CM

## 2016-07-17 NOTE — Telephone Encounter (Signed)
04/2016 last ov and refill 

## 2016-07-24 ENCOUNTER — Ambulatory Visit (INDEPENDENT_AMBULATORY_CARE_PROVIDER_SITE_OTHER): Payer: Federal, State, Local not specified - PPO | Admitting: Neurology

## 2016-07-24 ENCOUNTER — Encounter: Payer: Self-pay | Admitting: Neurology

## 2016-07-24 VITALS — BP 130/82 | HR 70 | Resp 16 | Ht 66.5 in | Wt 217.0 lb

## 2016-07-24 DIAGNOSIS — G4733 Obstructive sleep apnea (adult) (pediatric): Secondary | ICD-10-CM

## 2016-07-24 DIAGNOSIS — Z9989 Dependence on other enabling machines and devices: Secondary | ICD-10-CM

## 2016-07-24 NOTE — Progress Notes (Addendum)
SLEEP MEDICINE CLINIC   Provider:  Larey Seat, M D  Referring Provider: Darreld Mclean, MD Primary Care Physician:  Elizabeth Sauer  Chief Complaint  Patient presents with  . Follow-up    Rm 11. No new concerns. He brought some ppw that the [place] needs signed. DME: [employer]     HPI:  Christopher [name] is a 51 y.o. male , seen here as a referral from Dr. 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  The patient provided me with several copies of previous sleep studies. The patient also owns 2 machines- one of the normal longer downloadable through our software. Christopher [name]. presented with a sleep study from [place] [facility]. At the time in 6667 he was 51 year old gentleman referred for possible sleep disordered breathing he reached an AHI of 88 with an oxygen nadir of 64%, had loud snoring, no PLMS were noted. He was titrated to 15 cm water pressure he used a nasal interface. This is a titration his AHI was reduced to 4.4 an improvement of over 90%. BiPAP was not attempted. On 15 cm the AHI was 0.8.  He then underwent a new titration on 11/12/2013 in [place]. He was re-titrated to CPAP this time he had frequent periodic limb movements for an index of 20.4 but very few arousals. He was titrated to 14 cm water pressure but his AHI was 3.5. I would venture to say that he was under titrated.  The patient received a new  CPAP which he has used preferable since. He has used a new machine 329 out of 365 days which is at 90% compliance he has 100% compliance for the last 30 days his AHI is 1.7 on 14 cm water was 1 cm EPR. Average user time is 8 hours and 5 minutes. Serial number is [id number]. The patient endorsed today the Epworth Sleepiness Scale at 0 points. The patient is using CPAP now for almost a decade  and is highly compliant.  Sleep habits are as follows: He goes to bed at 9:30 PM and usually falls asleep promptly. The bedroom is cool quiet and dark, he prefers to sleep on his side on one pillow. He rises at 5:30 with an alarm. Total sleep time is estimated to exceed 7-1/2 hours- more likely 8 hours. He feels refreshed and rested. Medical history ; HTN, obesity, high cholesterol. Social history: 3 stepdaughters, 1 biological.   Interval health history from 07/24/2016. I have the pleasure of seeing Christopher [name] here today who has been a highly compliant CPAP user. His CPAP compliance visit 100% for the last 30 days with an average of 8 hours and 21 minutes, his expiratory pressure relief is at 1 cm with 14 cm CPAP and his residual AHI is 1.6 he has only minor air leaks noted. The actual time used is 8 hours and 21 minutes per day the total used hours are 4559 hours since the machine was issued. The patient does not have diabetes, he has controlled hypertension with the help of only 1 medication, he does not have atrial fibrillation, no history of strokes, epilepsy or seizures, and no significant change in weight since last seen.   Review of Systems: Out of a complete 14 system review, the patient complains of only the  following symptoms, and all other reviewed systems are negative. Snoring, weight gain-    all remain the same. Epworth score  0 , Fatigue severity score 9  , depression score 0   Social History   Social History  . Marital status: Married    Spouse name: N/A  . Number of children: N/A  . Years of education: N/A   Occupational History  . Not on file.   Social History Main Topics  . Smoking status: Never Smoker  . Smokeless tobacco: Never Used  . Alcohol use No  . Drug use: No  . Sexual activity: No   Other Topics Concern  . Not on file   Social History Narrative  . No narrative on file    Family History  Problem Relation Age of Onset  . Hyperlipidemia  Father   . Cancer Maternal Grandfather     Past Medical History:  Diagnosis Date  . Back pain   . MRSA carrier     Past Surgical History:  Procedure Laterality Date  . 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 CERVICAL FUSION      Current Outpatient Prescriptions  Medication Sig Dispense Refill  . aspirin 81 MG tablet Take 81 mg by mouth daily.    . calcipotriene (DOVONOX) 0.005 % cream Apply topically 2 (two) times daily. 60 g 0  . cyclobenzaprine (FLEXERIL) 5 MG tablet Take 1 tablet (5 mg total) by mouth 3 (three) times daily. (Patient taking differently: Take 5 mg by mouth as needed. ) 90 tablet 0  . famotidine (PEPCID) 20 MG tablet Take 1 tablet (20 mg total) by mouth 2 (two) times daily. 180 tablet 3  . ibuprofen (ADVIL,MOTRIN) 800 MG tablet TAKE 1 TABLET (800 MG TOTAL) BY MOUTH EVERY 8 (EIGHT) HOURS AS NEEDED. 180 tablet 0  . losartan (COZAAR) 50 MG tablet Take 1 tablet (50 mg total) by mouth daily. 90 tablet 1  . mupirocin ointment (BACTROBAN) 2 % Place 1 application into the nose 2 (two) times daily. 30 g 3  . omega-3 acid ethyl esters (LOVAZA) 1 g capsule Take 2 capsules (2 g total) by mouth 2 (two) times daily. 360 capsule 0  . travoprost, benzalkonium, (TRAVATAN) 0.004 % ophthalmic solution 1 drop at bedtime.     No current facility-administered medications for this visit.     Allergies as of 07/24/2016 - Review Complete 07/24/2016  Allergen Reaction Noted  . Biaxin [clarithromycin] Shortness Of Breath 03/25/2015    Vitals: BP 130/82   Pulse 70   Resp 16   Ht 5' 6.5" (1.689 m)   Wt 217 lb (98.4 kg)   BMI 34.50 kg/m  Last Weight:  Wt Readings from Last 1 Encounters:  07/24/16 217 lb (98.4 kg)   [id number] mass index is 34.5 kg/m.     Last Height:   Ht Readings from Last 1 Encounters:  07/24/16 5' 6.5" (1.689 m)    Physical exam:  General: The patient is awake, alert and appears not in acute distress. The patient is well groomed.  Neck is supple. Mallampati 3,    neck circumference: 20. Nasal airflow unrestricted , TMJ  Click  not  evident . Retrognathia is seen.  Cardiovascular:  Regular rate and rhythm, without  murmurs or carotid bruit, and without distended neck veins. Respiratory: Lungs are clear to auscultation. Skin:  Without evidence of edema, or rash . Trunk: BMI is high . The patient's posture is erect.  Neurologic exam :  The patient is awake and alert, oriented to place and time.   Attention span & concentration ability appears normal.  Speech is fluent,  without dysarthria, dysphonia or aphasia.  Mood and affect are appropriate.  Cranial nerves: Intact smell and taste sense  .Pupils are equal and briskly reactive to light.  Facial motor strength is symmetric and tongue and uvula move midline. Shoulder shrug was symmetrical.  Motor exam:  Tone, muscle bulk and strength intact in all extremities. [name] tendon reflexes: in the  upper and lower extremities are symmetric and intact. Babinski maneuver response is downgoing.  The patient was advised of the nature of the diagnosed sleep disorder , the treatment options and risks for general a health and wellness arising from not treating the condition.  I spent more than 15 minutes of face to face time with the patient. Greater than 50% of time was spent in counseling and coordination of care. We have discussed the diagnosis and differential and I answered the patient's questions.    Assessment:  After physical and neurologic examination, review of laboratory studies,  Personal review of imaging studies, reports of other /same  Imaging studies ,  Results of polysomnography/ neurophysiology testing and pre-existing records as far as provided in visit., my assessment is   1) Christopher [name] is patient with a brand new CPAP set at good therapeutic pressure, and has high compliance data. He has been 100% compliant with CPAP use,  We were able to review a 90 day download with 100% compliance on average  user time of 8 hours and 3 minutes, total time used of 4595 hours, and a residual AHI for a 90 day period of 1.7 at 14 cm water pressure was 1 cm expiratory pressure relief.  2)  OSA with main risk factor of weight -and resulting neck size 20 inches !    Plan:  Treatment plan and additional workup : yearly follow up.   Christopher [name] shall be allowed to fly unrestricted. He is highly compliant , his medical conditions have been stable and he is compliant with medication as well.   Christopher [name] has definitely benefited from the treatment and fields no residual daytime sleepiness. He continues to be alert, refreshed and restored after nocturnal sleep.    Christopher Partridge Kalyani Maeda MD  07/24/2016   CC: Darreld Mclean, [address] [place], [place] [address]

## 2016-08-11 ENCOUNTER — Ambulatory Visit: Payer: Federal, State, Local not specified - PPO | Admitting: Gastroenterology

## 2016-08-27 ENCOUNTER — Other Ambulatory Visit: Payer: Self-pay | Admitting: Physician Assistant

## 2016-08-27 DIAGNOSIS — E7800 Pure hypercholesterolemia, unspecified: Secondary | ICD-10-CM

## 2016-09-19 ENCOUNTER — Ambulatory Visit (INDEPENDENT_AMBULATORY_CARE_PROVIDER_SITE_OTHER): Payer: Federal, State, Local not specified - PPO | Admitting: Gastroenterology

## 2016-09-19 ENCOUNTER — Encounter: Payer: Self-pay | Admitting: Gastroenterology

## 2016-09-19 VITALS — BP 140/100 | HR 100 | Ht 66.0 in | Wt 224.0 lb

## 2016-09-19 DIAGNOSIS — K602 Anal fissure, unspecified: Secondary | ICD-10-CM

## 2016-09-19 DIAGNOSIS — K580 Irritable bowel syndrome with diarrhea: Secondary | ICD-10-CM | POA: Diagnosis not present

## 2016-09-19 MED ORDER — AMBULATORY NON FORMULARY MEDICATION: 0 refills | Status: DC

## 2016-09-19 NOTE — Patient Instructions (Addendum)
We have sent the following medications to your pharmacy for you to pick up at your convenience: Nitroglicerin ointment=[place] ointment as needed for pain  Normal BMI (Body Mass Index- based on height and weight) is between 19 and 25. Your BMI today is Body mass index is 36.15 kg/m. Marland Kitchen Please consider follow up  regarding your BMI with your Primary Care Provider.  Thank you,

## 2016-09-19 NOTE — Progress Notes (Signed)
[facility] Consult Note:  History: Christopher [name] 09/19/2016  Referring physician: Elizabeth Sauer  Reason for consult/chief complaint: Anal Fissure (x 3 months, previously diagnosed 2 years prior)   Subjective  HPI:  This is a 51 year old man referred by primary care for probable anal fissure with pain and bleeding. He reports being diagnosed with a fissure about 2 years ago while living and working in [place]. He was given prednisone cream to the best of his recollection. He says it seemed to help and those symptoms resolved, but recently he had recurrence of anal pain and bleeding with bowel movements. He tried the same cream but it has not helped. He previously had a diagnosis of IBS which he related to stress at work and home. Over the last several months he has tended toward a bowel movement shortly after meals that tends to be semi-formed or loose but not bloody. The bleeding he has experienced from the fissure is not mixed with the stool.  ROS:  Review of Systems  Constitutional: Negative for appetite change and unexpected weight change.  HENT: Negative for mouth sores and voice change.   Eyes: Negative for pain and redness.  Respiratory: Negative for cough and shortness of breath.   Cardiovascular: Negative for chest pain and palpitations.  Genitourinary: Negative for dysuria and hematuria.  Musculoskeletal: Negative for arthralgias and myalgias.  Skin: Negative for pallor and rash.  Neurological: Negative for weakness and headaches.  Hematological: Negative for adenopathy.     Past Medical History: Past Medical History:  Diagnosis Date  . Back pain   . Glaucoma   . Hypertension   . MRSA carrier      Past Surgical History: Past Surgical History:  Procedure Laterality Date  . 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 CERVICAL FUSION       Family History: Family History  Problem Relation Age of Onset  . Hyperlipidemia Father   . Cancer  Maternal Grandfather   . Diabetes Paternal Grandmother   . Heart disease Paternal Grandmother     Social History: Social History   Social History  . Marital status: Married    Spouse name: N/A  . Number of children: N/A  . Years of education: N/A   Social History Main Topics  . Smoking status: Never Smoker  . Smokeless tobacco: Never Used  . Alcohol use No  . Drug use: No  . Sexual activity: No   Other Topics Concern  . None   Social History Narrative  . None    Allergies: Allergies  Allergen Reactions  . Biaxin [Clarithromycin] Shortness Of Breath    Outpatient Meds: Current Outpatient Prescriptions  Medication Sig Dispense Refill  . aspirin 81 MG tablet Take 81 mg by mouth daily.    . calcipotriene (DOVONOX) 0.005 % cream Apply topically 2 (two) times daily. 60 g 0  . cyclobenzaprine (FLEXERIL) 5 MG tablet Take 1 tablet (5 mg total) by mouth 3 (three) times daily. (Patient taking differently: Take 5 mg by mouth daily as needed. ) 90 tablet 0  . famotidine (PEPCID) 20 MG tablet Take 1 tablet (20 mg total) by mouth 2 (two) times daily. 180 tablet 3  . ibuprofen (ADVIL,MOTRIN) 800 MG tablet TAKE 1 TABLET (800 MG TOTAL) BY MOUTH EVERY 8 (EIGHT) HOURS AS NEEDED. 180 tablet 0  . losartan (COZAAR) 50 MG tablet Take 1 tablet (50 mg total) by mouth daily. 90 tablet 1  . mupirocin ointment (BACTROBAN) 2 %  Place 1 application into the nose 2 (two) times daily. 30 g 3  . omega-3 acid ethyl esters (LOVAZA) 1 g capsule TAKE 2 CAPSULES (2 G TOTAL) BY MOUTH 2 (TWO) TIMES DAILY. 360 capsule 0  . travoprost, benzalkonium, (TRAVATAN) 0.004 % ophthalmic solution 1 drop at bedtime.    . AMBULATORY NON FORMULARY MEDICATION NTG 0.125% ointment, use three times a day 30 g 0   No current facility-administered medications for this visit.       ___________________________________________________________________ Objective   Exam:  BP (!) 140/100   Pulse 100   Ht 5\' 6"  (1.676 m)    Wt 224 lb (101.6 kg)   BMI 36.15 kg/m    General: this is a(n) Obese and well-appearing man   Eyes: sclera anicteric, no redness  CV: RRR without murmur, S1/S2, no JVD, no peripheral edema  Resp: clear to auscultation bilaterally, normal RR and effort noted  GI: soft, no tenderness, with active bowel sounds. No guarding or palpable organomegaly noted.  Rectal: Sentinel pile, tender posterior anal fissure, cannot complete exam due to pain. I applied some Recticare ointment  Skin; warm and dry, no rash or jaundice noted  Neuro: awake, alert and oriented x 3. Normal gross motor function and fluent speech    Assessment: Encounter Diagnoses  Name Primary?  Marland Kitchen Anal fissure Yes  . Irritable bowel syndrome with diarrhea     We discussed the nature and treatment of anal fissure. It will probably take 1 or 2 months to get complete improvement. Hopefully we will be able to avoid a referral to colorectal surgery.  Plan:  1 Imodium tablet every morning to slightly decrease his bowel movement frequency Combination of 0.125% nitroglycerin ointment and OTC lidocaine ointment  See me in 4 weeks  Thank you for the courtesy of this consult.  Please call me with any questions or concerns.  Nelida Meuse III  CC: WEBER,SARAH, PA-C

## 2016-09-25 ENCOUNTER — Other Ambulatory Visit: Payer: Self-pay | Admitting: Physician Assistant

## 2016-09-25 DIAGNOSIS — M545 Low back pain, unspecified: Secondary | ICD-10-CM

## 2016-09-25 DIAGNOSIS — G8929 Other chronic pain: Secondary | ICD-10-CM

## 2016-09-27 ENCOUNTER — Encounter: Payer: Self-pay | Admitting: Family Medicine

## 2016-09-27 ENCOUNTER — Ambulatory Visit (INDEPENDENT_AMBULATORY_CARE_PROVIDER_SITE_OTHER): Payer: Federal, State, Local not specified - PPO | Admitting: Family Medicine

## 2016-09-27 ENCOUNTER — Other Ambulatory Visit: Payer: Self-pay | Admitting: Physician Assistant

## 2016-09-27 VITALS — BP 146/105 | HR 93 | Temp 98.4°F | Resp 18 | Ht 66.0 in | Wt 222.6 lb

## 2016-09-27 DIAGNOSIS — Z8614 Personal history of Methicillin resistant Staphylococcus aureus infection: Secondary | ICD-10-CM | POA: Diagnosis not present

## 2016-09-27 DIAGNOSIS — L739 Follicular disorder, unspecified: Secondary | ICD-10-CM

## 2016-09-27 DIAGNOSIS — L409 Psoriasis, unspecified: Secondary | ICD-10-CM

## 2016-09-27 MED ORDER — DOXYCYCLINE HYCLATE 100 MG PO TABS: 100.0000 mg | ORAL_TABLET | Freq: Two times a day (BID) | ORAL | 0 refills | Status: DC

## 2016-09-27 NOTE — Patient Instructions (Addendum)
Bactroban ointment to the nose twice per day for 5 days for colonization. Bactroban ointment to the face 3 times per day for 10 days for treatment of the folliculitis. Can start doxycycline by mouth 1 pill twice per day for 10 days if areas are not improving in the next 24-48 hours, or if worsening sooner.  Return to the clinic or go to the nearest emergency room if any of your symptoms worsen or new symptoms occur.   Folliculitis Folliculitis is inflammation of the hair follicles. Folliculitis most commonly occurs on the scalp, thighs, legs, back, and buttocks. However, it can occur anywhere on the body. What are the causes? This condition may be caused by:  A bacterial infection (common).  A fungal infection.  A viral infection.  Coming into contact with certain chemicals, especially oils and tars.  Shaving or waxing.  Applying greasy ointments or creams to your skin often. Long-lasting folliculitis and folliculitis that keeps coming back can be caused by bacteria that live in the nostrils. What increases the risk? This condition is more likely to develop in people with:  A weakened immune system.  Diabetes.  Obesity. What are the signs or symptoms? Symptoms of this condition include:  Redness.  Soreness.  Swelling.  Itching.  Small white or yellow, pus-filled, itchy spots (pustules) that appear over a reddened area. If there is an infection that goes deep into the follicle, these may develop into a boil (furuncle).  A group of closely packed boils (carbuncle). These tend to form in hairy, sweaty areas of the body. How is this diagnosed? This condition is diagnosed with a skin exam. To find what is causing the condition, your health care provider may take a sample of one of the pustules or boils for testing. How is this treated? This condition may be treated by:  Applying warm compresses to the affected areas.  Taking an antibiotic medicine or applying an  antibiotic medicine to the skin.  Applying or bathing with an antiseptic solution.  Taking an over-the-counter medicine to help with itching.  Having a procedure to drain any pustules or boils. This may be done if a pustule or boil contains a lot of pus or fluid.  Laser hair removal. This may be done to treat long-lasting folliculitis. Follow these instructions at home:  If directed, apply heat to the affected area as often as told by your health care provider. Use the heat source that your health care provider recommends, such as a moist heat pack or a heating pad.  Place a towel between your skin and the heat source.  Leave the heat on for 20-30 minutes.  Remove the heat if your skin turns bright red. This is especially important if you are unable to feel pain, heat, or cold. You may have a greater risk of getting burned.  If you were prescribed an antibiotic medicine, use it as told by your health care provider. Do not stop using the antibiotic even if you start to feel better.  Take over-the-counter and prescription medicines only as told by your health care provider.  Do not shave irritated skin.  Keep all follow-up visits as told by your health care provider. This is important. Get help right away if:  You have more redness, swelling, or pain in the affected area.  Red streaks are spreading from the affected area.  You have a fever. This information is not intended to replace advice given to you by your health care provider. Make  sure you discuss any questions you have with your health care provider. Document Released: 07/24/2001 Document Revised: 12/03/2015 Document Reviewed: 03/05/2015 Elsevier Interactive Patient Education  2017 [employer].    IF you received an x-ray today, you will receive an invoice from [facility]. Please contact [facility] at [phone] with questions or concerns regarding your invoice.   IF you received labwork today,  you will receive an invoice from [facility]. Please contact [facility] at [phone] with questions or concerns regarding your invoice.   Our billing staff will not be able to assist you with questions regarding bills from these companies.  You will be contacted with the lab results as soon as they are available. The fastest way to get your results is to activate your My Chart account. Instructions are located on the last page of this paperwork. If you have not heard from [place] regarding the results in 2 weeks, please contact this office.

## 2016-09-27 NOTE — Progress Notes (Signed)
Subjective:  By signing my name below, I, Christopher [name], attest that this documentation has been prepared under the direction and in the presence of [name], MD Electronically Signed: Ladene Artist, ED Scribe 09/27/2016 at 10:49 AM.   Patient ID: Christopher [name], male    DOB: [birth year], 51 y.o.   MRN: [id number]  Chief Complaint  Patient presents with  . Rash      Bumps/Rash on face X 3 days   HPI Christopher [name] is a 51 y.o. male who presents to Primary Care at [facility] complaining of a burning and pruritic rash to the left face and chin onset 6 days ago. Pt was seen in the office in September 2017 for MRSA infection that spread to finger requiring I&D by hand surgeon. Pt has a h/o MRSA outbreaks in the past. His last wound culture from September showed MRSA was sensitive to tetracycline and sulfa. Pt was treated with Bactroban to nose for colonization treatment in September for 10 days. He states that he has been taking Bactroban twice x6 days. Pt states that he has noticed that the rash tends to spread with shaving so he has been changing his razors once a week. Pt denies fever. He leaves tomorrow to travel to the [place] until next week.   Patient Active Problem List   Diagnosis Date Noted  . Sleep apnea 08/31/2015  . HTN (hypertension) 08/19/2015  . History of MRSA infection 08/19/2015  . Psoriasis 08/19/2015  . Elevated cholesterol 08/19/2015  . Obesity, Class II, BMI 35-39.9 08/19/2015  . Chronic back pain 03/25/2015   Past Medical History:  Diagnosis Date  . Back pain   . Glaucoma   . Hypertension   . MRSA carrier    Past Surgical History:  Procedure Laterality Date  . 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 CERVICAL FUSION     Allergies  Allergen Reactions  . Biaxin [Clarithromycin] Shortness Of Breath   Prior to Admission medications   Medication Sig Start Date End Date Taking? Authorizing Provider  AMBULATORY NON FORMULARY  MEDICATION NTG 0.125% ointment, use three times a day 09/19/16  Yes Nelida Meuse III, MD  aspirin 81 MG tablet Take 81 mg by mouth daily.   Yes Historical Provider, MD  calcipotriene (DOVONOX) 0.005 % cream Apply topically 2 (two) times daily. 05/12/16  Yes Mancel Bale, PA-C  cyclobenzaprine (FLEXERIL) 5 MG tablet Take 1 tablet (5 mg total) by mouth 3 (three) times daily. OFFICE VISIT NEEDED FOR REFILLS 09/25/16  Yes Mancel Bale, PA-C  famotidine (PEPCID) 20 MG tablet Take 1 tablet (20 mg total) by mouth 2 (two) times daily. 05/12/16  Yes Sarah Alleen Borne, PA-C  ibuprofen (ADVIL,MOTRIN) 800 MG tablet TAKE 1 TABLET (800 MG TOTAL) BY MOUTH EVERY 8 (EIGHT) HOURS AS NEEDED. 07/18/16  Yes Mancel Bale, PA-C  losartan (COZAAR) 50 MG tablet Take 1 tablet (50 mg total) by mouth daily. 05/12/16  Yes Mancel Bale, PA-C  mupirocin ointment (BACTROBAN) 2 % Place 1 application into the nose 2 (two) times daily. 05/12/16  Yes Mancel Bale, PA-C  omega-3 acid ethyl esters (LOVAZA) 1 g capsule TAKE 2 CAPSULES (2 G TOTAL) BY MOUTH 2 (TWO) TIMES DAILY. 08/28/16  Yes Mancel Bale, PA-C  travoprost, benzalkonium, (TRAVATAN) 0.004 % ophthalmic solution 1 drop at bedtime.   Yes Historical Provider, MD   Social History   Social History  . Marital  status: Married    Spouse name: N/A  . Number of children: N/A  . Years of education: N/A   Occupational History  . Not on file.   Social History Main Topics  . Smoking status: Never Smoker  . Smokeless tobacco: Never Used  . Alcohol use No  . Drug use: No  . Sexual activity: No   Other Topics Concern  . Not on file   Social History Narrative  . No narrative on file   Review of Systems  Constitutional: Negative for fever.  Skin: Positive for rash.      Objective:   Physical Exam  Constitutional: He is oriented to person, place, and time. He appears well-developed and well-nourished. No distress.  HENT:  Head: Normocephalic and atraumatic.  Eyes:  Conjunctivae and EOM are normal.  Neck: Neck supple. No tracheal deviation present.  Cardiovascular: Normal rate.   Pulmonary/Chest: Effort normal. No respiratory distress.  Musculoskeletal: Normal range of motion.  Neurological: He is alert and oriented to person, place, and time.  Skin: Skin is warm and dry.  6-7 erythematous slightly indurated patches L jaw line, L neck and 1 in front of L ear. Slight dry blood at surface of lesion. Very minimal induration at jaw line lesion without appreciable discharge.  Psychiatric: He has a normal mood and affect. His behavior is normal.  Nursing note and vitals reviewed.  Vitals:   09/27/16 1026 09/27/16 1111  BP: (!) 142/91 (!) 146/105  Pulse: 93   Resp: 18   Temp: 98.4 F (36.9 C)   TempSrc: Oral   SpO2: 95%   Weight: 222 lb 9.6 oz (101 kg)   Height: 5\' 6"  (1.676 m)   Pt did not take his BP medication this morning.     Assessment & Plan:    Christopher [name] is a 51 y.o. male Folliculitis - Plan: doxycycline (VIBRA-TABS) 100 MG tablet, WOUND CULTURE  History of MRSA infection - Plan: doxycycline (VIBRA-TABS) 100 MG tablet  - Early folliculitis of left side of face with history of recurrent MRSA infection. Currently on Bactroban nasal and Bactroban topical to treat for possible MRSA as well as colonization treatment.  - Doxycycline prescription given if symptoms are not improving in the next 24-48 hours given his history of need for oral antibiotics with MRSA infections in the past. RTC precautions. Photosensitivity discussed with doxycycline  Meds ordered this encounter  Medications  . doxycycline (VIBRA-TABS) 100 MG tablet    Sig: Take 1 tablet (100 mg total) by mouth 2 (two) times daily.    Dispense:  20 tablet    Refill:  0   Patient Instructions    Bactroban ointment to the nose twice per day for 5 days for colonization. Bactroban ointment to the face 3 times per day for 10 days for treatment of the folliculitis. Can start  doxycycline by mouth 1 pill twice per day for 10 days if areas are not improving in the next 24-48 hours, or if worsening sooner.  Return to the clinic or go to the nearest emergency room if any of your symptoms worsen or new symptoms occur.   Folliculitis Folliculitis is inflammation of the hair follicles. Folliculitis most commonly occurs on the scalp, thighs, legs, back, and buttocks. However, it can occur anywhere on the body. What are the causes? This condition may be caused by:  A bacterial infection (common).  A fungal infection.  A viral infection.  Coming into contact with certain chemicals, especially oils  and tars.  Shaving or waxing.  Applying greasy ointments or creams to your skin often. Long-lasting folliculitis and folliculitis that keeps coming back can be caused by bacteria that live in the nostrils. What increases the risk? This condition is more likely to develop in people with:  A weakened immune system.  Diabetes.  Obesity. What are the signs or symptoms? Symptoms of this condition include:  Redness.  Soreness.  Swelling.  Itching.  Small white or yellow, pus-filled, itchy spots (pustules) that appear over a reddened area. If there is an infection that goes deep into the follicle, these may develop into a boil (furuncle).  A group of closely packed boils (carbuncle). These tend to form in hairy, sweaty areas of the body. How is this diagnosed? This condition is diagnosed with a skin exam. To find what is causing the condition, your health care provider may take a sample of one of the pustules or boils for testing. How is this treated? This condition may be treated by:  Applying warm compresses to the affected areas.  Taking an antibiotic medicine or applying an antibiotic medicine to the skin.  Applying or bathing with an antiseptic solution.  Taking an over-the-counter medicine to help with itching.  Having a procedure to drain any  pustules or boils. This may be done if a pustule or boil contains a lot of pus or fluid.  Laser hair removal. This may be done to treat long-lasting folliculitis. Follow these instructions at home:  If directed, apply heat to the affected area as often as told by your health care provider. Use the heat source that your health care provider recommends, such as a moist heat pack or a heating pad.  Place a towel between your skin and the heat source.  Leave the heat on for 20-30 minutes.  Remove the heat if your skin turns bright red. This is especially important if you are unable to feel pain, heat, or cold. You may have a greater risk of getting burned.  If you were prescribed an antibiotic medicine, use it as told by your health care provider. Do not stop using the antibiotic even if you start to feel better.  Take over-the-counter and prescription medicines only as told by your health care provider.  Do not shave irritated skin.  Keep all follow-up visits as told by your health care provider. This is important. Get help right away if:  You have more redness, swelling, or pain in the affected area.  Red streaks are spreading from the affected area.  You have a fever. This information is not intended to replace advice given to you by your health care provider. Make sure you discuss any questions you have with your health care provider. Document Released: 07/24/2001 Document Revised: 12/03/2015 Document Reviewed: 03/05/2015 Elsevier Interactive Patient Education  2017 [employer].    IF you received an x-ray today, you will receive an invoice from [facility]. Please contact [facility] at [phone] with questions or concerns regarding your invoice.   IF you received labwork today, you will receive an invoice from [place]. Please contact [facility] at [phone] with questions or concerns regarding your invoice.   Our billing staff will not be able to assist  you with questions regarding bills from these companies.  You will be contacted with the lab results as soon as they are available. The fastest way to get your results is to activate your My Chart account. Instructions are located on the last page  of this paperwork. If you have not heard from [place] regarding the results in 2 weeks, please contact this office.       I personally performed the services described in this documentation, which was scribed in my presence. The recorded information has been reviewed and considered for accuracy and completeness, addended by me as needed, and agree with information above.  Signed,   Merri Ray, MD Primary [facility] at [place].  09/27/16 12:26 PM

## 2016-09-28 NOTE — Telephone Encounter (Signed)
Just seen for MRSA, is this related?

## 2016-09-29 LAB — WOUND CULTURE: ORGANISM ID, BACTERIA: NONE SEEN

## 2016-10-10 ENCOUNTER — Ambulatory Visit: Payer: Federal, State, Local not specified - PPO | Admitting: Neurology

## 2016-10-17 ENCOUNTER — Other Ambulatory Visit: Payer: Self-pay | Admitting: Physician Assistant

## 2016-10-17 DIAGNOSIS — I10 Essential (primary) hypertension: Secondary | ICD-10-CM

## 2016-10-18 ENCOUNTER — Ambulatory Visit (INDEPENDENT_AMBULATORY_CARE_PROVIDER_SITE_OTHER): Payer: Federal, State, Local not specified - PPO | Admitting: Physician Assistant

## 2016-10-18 ENCOUNTER — Encounter: Payer: Self-pay | Admitting: Physician Assistant

## 2016-10-18 VITALS — BP 122/84 | HR 75 | Temp 97.9°F | Resp 18 | Ht 66.0 in | Wt 220.0 lb

## 2016-10-18 DIAGNOSIS — L409 Psoriasis, unspecified: Secondary | ICD-10-CM | POA: Diagnosis not present

## 2016-10-18 DIAGNOSIS — K582 Mixed irritable bowel syndrome: Secondary | ICD-10-CM

## 2016-10-18 DIAGNOSIS — F439 Reaction to severe stress, unspecified: Secondary | ICD-10-CM | POA: Diagnosis not present

## 2016-10-18 DIAGNOSIS — K589 Irritable bowel syndrome without diarrhea: Secondary | ICD-10-CM | POA: Insufficient documentation

## 2016-10-18 MED ORDER — CALCIPOTRIENE 0.005 % EX CREA: TOPICAL_CREAM | Freq: Two times a day (BID) | CUTANEOUS | 0 refills | Status: DC

## 2016-10-18 MED ORDER — DICYCLOMINE HCL 10 MG PO CAPS: 10.0000 mg | ORAL_CAPSULE | Freq: Three times a day (TID) | ORAL | 0 refills | Status: DC

## 2016-10-18 NOTE — Progress Notes (Signed)
Christopher [name]  MRN: [id number] DOB: 1965/08/03  PCP: Mancel Bale, PA-C  Chief Complaint  Patient presents with  . Irritable Bowel Syndrome    PT states IBS is getting worse. x1 month ago.  . Medication Refill    Calcipotriene Cream 0.005%    Subjective:  Pt presents to clinic for problems with IBS for the last month. Bloating and gas - 3-4 times a day stools - mix between loose, soft and hard - no straws, rare gum, no change in diet - he has tried some pepto but not much help and he does not like to use that medication much. Seems to have cramping on the left abd after eating that does seem to improve after passing a stoll about an hour after eating.  He is under a lot of stress - wife is getting ready to have surgery for her back - he has a 49 y/o that has been hard because he has now had to me more of a caregiver with his wife being hurt.  He is also worried when he is at work that his wife may not be able to take of his child.  Needs a refill on the cream for the eczema on his left elbow.  He stopped his motrin about 2 weeks ago after his back injection because his back pain is currently controlled.  He does not drink ETOH.  Dx in [place] about 5 years ago - IBS -   Review of Systems  Constitutional: Negative for chills and fever.  Gastrointestinal: Positive for abdominal pain (cramping), constipation and diarrhea. Negative for blood in stool, nausea and vomiting.    Patient Active Problem List   Diagnosis Date Noted  . Sleep apnea 08/31/2015  . HTN (hypertension) 08/19/2015  . History of MRSA infection 08/19/2015  . Psoriasis 08/19/2015  . Elevated cholesterol 08/19/2015  . Obesity, Class II, BMI 35-39.9 08/19/2015  . Chronic back pain 03/25/2015    Current Outpatient Prescriptions on File Prior to Visit  Medication Sig Dispense Refill  . AMBULATORY NON FORMULARY MEDICATION NTG 0.125% ointment, use three times a day 30 g 0  . aspirin 81 MG tablet Take 81 mg by mouth  daily.    . cyclobenzaprine (FLEXERIL) 5 MG tablet Take 1 tablet (5 mg total) by mouth 3 (three) times daily. OFFICE VISIT NEEDED FOR REFILLS 90 tablet 0  . famotidine (PEPCID) 20 MG tablet Take 1 tablet (20 mg total) by mouth 2 (two) times daily. 180 tablet 3  . ibuprofen (ADVIL,MOTRIN) 800 MG tablet TAKE 1 TABLET (800 MG TOTAL) BY MOUTH EVERY 8 (EIGHT) HOURS AS NEEDED. 180 tablet 0  . losartan (COZAAR) 50 MG tablet TAKE 1 TABLET (50 MG TOTAL) BY MOUTH DAILY. 90 tablet 1  . mupirocin ointment (BACTROBAN) 2 % Place 1 application into the nose 2 (two) times daily. 30 g 3  . omega-3 acid ethyl esters (LOVAZA) 1 g capsule TAKE 2 CAPSULES (2 G TOTAL) BY MOUTH 2 (TWO) TIMES DAILY. 360 capsule 0  . travoprost, benzalkonium, (TRAVATAN) 0.004 % ophthalmic solution 1 drop at bedtime.    . triamcinolone cream (KENALOG) 0.5 % APPLY 1 APPLICATION TOPICALLY 3 (THREE) TIMES DAILY. 30 g 1   No current facility-administered medications on file prior to visit.     Allergies  Allergen Reactions  . Biaxin [Clarithromycin] Shortness Of Breath    Pt patients past, family and social history were reviewed and updated.   Objective:  BP 122/84  Pulse 75   Temp 97.9 F (36.6 C) (Oral)   Resp 18   Ht 5\' 6"  (1.676 m)   Wt 220 lb (99.8 kg)   SpO2 98%   BMI 35.51 kg/m   Physical Exam  Constitutional: He is oriented to person, place, and time and well-developed, well-nourished, and in no distress.  HENT:  Head: Normocephalic and atraumatic.  Right Ear: External ear normal.  Left Ear: External ear normal.  Eyes: Conjunctivae are normal.  Neck: Normal range of motion.  Cardiovascular: Normal rate, regular rhythm and normal heart sounds.   No murmur heard. Pulmonary/Chest: Effort normal and breath sounds normal. He has no wheezes.  Abdominal: Soft. Bowel sounds are normal. He exhibits no distension and no mass. There is tenderness (left side slightly). There is no rebound and no guarding.  Neurological:  He is alert and oriented to person, place, and time. Gait normal.  Skin: Skin is warm and dry.  Left extensor elbow surface - patch of eczema  Psychiatric: Mood, memory, affect and judgment normal.    Assessment and Plan :  Irritable bowel syndrome with both constipation and diarrhea - Plan: dicyclomine (BENTYL) 10 MG capsule -pt will be mindful of pattern of his symptoms - most of his symptoms currently are cramping after eating so we will use bentyl to help with that - he will let me know if he needs a refill in a month - his stress will hopefully decrease this weekend when he takes his wife and daughter to be with her parents until her surgery in a month.  If his symptoms do not improve he should call his GI for further evaluation and care.  Psoriasis - Plan: calcipotriene (DOVONOX) 0.005 % cream  Stress at home - likely aggravating his IBS.  Christopher Hummingbird PA-C  [facility] 10/18/2016 6:04 PM

## 2016-10-18 NOTE — Patient Instructions (Signed)
     IF you received an x-ray today, you will receive an invoice from [facility]. Please contact [facility] at [phone] with questions or concerns regarding your invoice.   IF you received labwork today, you will receive an invoice from [facility]. Please contact [facility] at [phone] with questions or concerns regarding your invoice.   Our billing staff will not be able to assist you with questions regarding bills from these companies.  You will be contacted with the lab results as soon as they are available. The fastest way to get your results is to activate your My Chart account. Instructions are located on the last page of this paperwork. If you have not heard from us regarding the results in 2 weeks, please contact this office.     

## 2016-10-19 ENCOUNTER — Ambulatory Visit: Payer: Federal, State, Local not specified - PPO | Admitting: Gastroenterology

## 2016-11-08 ENCOUNTER — Encounter: Payer: Self-pay | Admitting: Gastroenterology

## 2016-11-08 ENCOUNTER — Ambulatory Visit (INDEPENDENT_AMBULATORY_CARE_PROVIDER_SITE_OTHER): Payer: Federal, State, Local not specified - PPO | Admitting: Gastroenterology

## 2016-11-08 VITALS — BP 120/70 | HR 84 | Ht 65.0 in | Wt 220.0 lb

## 2016-11-08 DIAGNOSIS — K602 Anal fissure, unspecified: Secondary | ICD-10-CM

## 2016-11-08 DIAGNOSIS — K580 Irritable bowel syndrome with diarrhea: Secondary | ICD-10-CM

## 2016-11-08 NOTE — Progress Notes (Signed)
     [facility] Progress Note  Chief Complaint: Anal fissure and IBS  Subjective  History:  Christopher [name] follows up for his diarrhea predominant IBS and anal fissure. He use the nitroglycerin ointment for about 2 weeks and the OTC lidocaine ointment for about a week. This past weekend he use the neck of glycerin ointment again. He no longer has bleeding and feels that the pain is resolved as well. His PCP started him on dicyclomine for IBS, and he has found it very helpful. He is under a lot of stress with work and concern over his wife's upcoming back surgery.  ROS: Cardiovascular:  no chest pain Respiratory: no dyspnea  The patient's Past Medical, Family and Social History were reviewed and are on file in the EMR.  Objective:  Med list reviewed  Vital signs in last 24 hrs: Vitals:   11/08/16 1600  BP: 120/70  Pulse: 84    Physical Exam    HEENT: sclera anicteric, oral mucosa moist without lesions  Neck: supple, no thyromegaly, JVD or lymphadenopathy  Cardiac: RRR without murmurs, S1S2 heard, no peripheral edema  Pulm: clear to auscultation bilaterally, normal RR and effort noted  Abdomen: soft, Obese, no tenderness, with active bowel sounds. No guarding or palpable hepatosplenomegaly.  Skin; warm and dry, no jaundice or rash  Rectal: Sentinel pile, anal fissure is healed, no tenderness, no palpable internal lesions.   @ASSESSMENTPLANBEGIN @ Assessment: Encounter Diagnoses  Name Primary?  Marland Kitchen Anal fissure Yes  . Irritable bowel syndrome with diarrhea    The fissure has healed. He will hold onto the appointments for the next few months in case there is a recurrence. Sounds like he is doing well on dicyclomine to help control his IBS. There is a significant stress component for him.   Plan:  See me as needed He brought up the need for him to have a screening colonoscopy next summer. He will call us when he is ready at that time.  Total time 15 minutes, over  half spent in counseling and coordination of care.   Nelida Meuse III

## 2016-11-08 NOTE — Patient Instructions (Signed)
If you are age 49 or older, your body mass index should be between 23-30. Your Body mass index is 36.61 kg/m. If this is out of the aforementioned range listed, please consider follow up with your Primary Care Provider.  If you are age 58 or younger, your body mass index should be between 19-25. Your Body mass index is 36.61 kg/m. If this is out of the aformentioned range listed, please consider follow up with your Primary Care Provider.   Thank you for choosing [facility]  Dr Wilfrid Lund III

## 2016-11-19 ENCOUNTER — Other Ambulatory Visit: Payer: Self-pay | Admitting: Physician Assistant

## 2016-11-19 DIAGNOSIS — E7800 Pure hypercholesterolemia, unspecified: Secondary | ICD-10-CM

## 2016-11-21 NOTE — Telephone Encounter (Signed)
mychart message sent to pt about making an appt °

## 2016-12-11 ENCOUNTER — Other Ambulatory Visit: Payer: Self-pay | Admitting: Physician Assistant

## 2016-12-11 DIAGNOSIS — G8929 Other chronic pain: Secondary | ICD-10-CM

## 2016-12-11 DIAGNOSIS — K582 Mixed irritable bowel syndrome: Secondary | ICD-10-CM

## 2016-12-11 DIAGNOSIS — M545 Low back pain: Principal | ICD-10-CM

## 2016-12-11 DIAGNOSIS — E7800 Pure hypercholesterolemia, unspecified: Secondary | ICD-10-CM

## 2016-12-14 ENCOUNTER — Encounter: Payer: Self-pay | Admitting: Physician Assistant

## 2016-12-14 ENCOUNTER — Ambulatory Visit (INDEPENDENT_AMBULATORY_CARE_PROVIDER_SITE_OTHER): Payer: Federal, State, Local not specified - PPO | Admitting: Physician Assistant

## 2016-12-14 VITALS — BP 130/86 | HR 70 | Temp 98.5°F | Resp 18 | Ht 66.54 in | Wt 220.0 lb

## 2016-12-14 DIAGNOSIS — K582 Mixed irritable bowel syndrome: Secondary | ICD-10-CM

## 2016-12-14 DIAGNOSIS — I10 Essential (primary) hypertension: Secondary | ICD-10-CM

## 2016-12-14 DIAGNOSIS — G8929 Other chronic pain: Secondary | ICD-10-CM

## 2016-12-14 DIAGNOSIS — R12 Heartburn: Secondary | ICD-10-CM | POA: Diagnosis not present

## 2016-12-14 DIAGNOSIS — M545 Low back pain: Secondary | ICD-10-CM

## 2016-12-14 DIAGNOSIS — E7800 Pure hypercholesterolemia, unspecified: Secondary | ICD-10-CM | POA: Diagnosis not present

## 2016-12-14 MED ORDER — CYCLOBENZAPRINE HCL 5 MG PO TABS: 5.0000 mg | ORAL_TABLET | Freq: Three times a day (TID) | ORAL | 1 refills | Status: DC

## 2016-12-14 MED ORDER — LOSARTAN POTASSIUM 50 MG PO TABS: 50.0000 mg | ORAL_TABLET | Freq: Every day | ORAL | 1 refills | Status: DC

## 2016-12-14 MED ORDER — DICYCLOMINE HCL 10 MG PO CAPS: 10.0000 mg | ORAL_CAPSULE | Freq: Two times a day (BID) | ORAL | 1 refills | Status: DC

## 2016-12-14 MED ORDER — FAMOTIDINE 20 MG PO TABS: 20.0000 mg | ORAL_TABLET | Freq: Two times a day (BID) | ORAL | 1 refills | Status: DC

## 2016-12-14 MED ORDER — FLUOCINOLONE ACETONIDE 0.01 % EX SHAM: MEDICATED_SHAMPOO | CUTANEOUS | 3 refills | Status: DC

## 2016-12-14 MED ORDER — OMEGA-3-ACID ETHYL ESTERS 1 G PO CAPS: 1.0000 g | ORAL_CAPSULE | Freq: Two times a day (BID) | ORAL | 1 refills | Status: DC

## 2016-12-14 NOTE — Progress Notes (Signed)
12/15/2016 1:22 PM   DOB: [birth year] / MRN: [id number]  SUBJECTIVE:  Christopher [name] is a 51 y.o. male presenting for multiple problems and medication refills.  Has a scalp rash that is not getting better and complains of copious flaking. Has tried selson blue and other otc topicals and these have helped but it seems the medication stops working. He does take an 81 mg ASA daily at night.    Has a history of IBS and tells me that his stomach has been rumbling and Bentyl has helped him greatly with this. He has been taking this twice daily and feels that this is all that he needs.   He does not check his BP at home. Pressures are relatively stable per CH[facility].   He is allergic to biaxin [clarithromycin].   He  has a past medical history of Back pain; Glaucoma; Hypertension; and MRSA carrier.    He  reports that he has never smoked. He has never used smokeless tobacco. He reports that he does not drink alcohol or use drugs. He  reports that he does not engage in sexual activity. The patient  has a past surgical history that includes Cervical fusion and carple tunnel in both hands .  His family history includes Cancer in his maternal grandfather; Diabetes in his paternal grandmother; Heart disease in his paternal grandmother; Hyperlipidemia in his father.  Review of Systems  Constitutional: Negative for chills, diaphoresis and fever.  Respiratory: Negative for cough, hemoptysis, sputum production, shortness of breath and wheezing.   Cardiovascular: Negative for chest pain, orthopnea and leg swelling.  Gastrointestinal: Negative for nausea.  Skin: Positive for rash. Negative for itching.  Neurological: Negative for dizziness.    The problem list and medications were reviewed and updated by myself where necessary and exist elsewhere in the encounter.   OBJECTIVE:  BP 130/86   Pulse 70   Temp 98.5 F (36.9 C) (Oral)   Resp 18   Ht 5' 6.53" (1.69 m)   Wt 220 lb (99.8 kg)   SpO2 98%    BMI 34.94 kg/m   Physical Exam  Constitutional: He appears well-developed. He is active and cooperative.  Non-toxic appearance.  Cardiovascular: Normal rate, regular rhythm, S1 normal, S2 normal, normal heart sounds, intact distal pulses and normal pulses.  Exam reveals no gallop and no friction rub.   No murmur heard. Pulmonary/Chest: Effort normal. No stridor. No tachypnea. No respiratory distress. He has no wheezes. He has no rales.  Abdominal: He exhibits no distension.  Musculoskeletal: He exhibits no edema.  Neurological: He is alert.  Skin: Skin is warm and dry. He is not diaphoretic. No pallor.     Vitals reviewed.   Lab Results  Component Value Date   WBC 12.3 (A) 02/08/2016   HGB 15.5 02/08/2016   HCT 44.5 02/08/2016   MCV 84.2 02/08/2016    Lab Results  Component Value Date   NA 144 05/12/2016   K 4.7 05/12/2016   CL 104 05/12/2016   CO2 22 05/12/2016   Lab Results  Component Value Date   CREATININE 0.94 05/12/2016   Lab Results  Component Value Date   ALT 92 (H) 05/12/2016   AST 40 05/12/2016   ALKPHOS 91 05/12/2016   BILITOT 0.2 05/12/2016   Lab Results  Component Value Date   CHOL 209 (H) 05/12/2016   HDL 41 05/12/2016   LDLCALC 146 (H) 05/12/2016   TRIG 109 05/12/2016   CHOLHDL 5.1 (H) 05/12/2016  No results found for this or any previous visit (from the past 72 hour(s)).  No results found.  ASSESSMENT AND PLAN:  Christopher [name] was seen today for rash and nevus.  Diagnoses and all orders for this visit:    Elevated cholesterol: Dropping his does of 4 grams daily to 2 grams daily.   -     Discontinue: omega-3 acid ethyl esters (LOVAZA) 1 g capsule; Take 1 capsule (1 g total) by mouth 2 (two) times daily. -     omega-3 acid ethyl esters (LOVAZA) 1 g capsule; Take 1 capsule (1 g total) by mouth 2 (two) times daily.  Irritable bowel syndrome with both constipation and diarrhea -     Discontinue: dicyclomine (BENTYL) 10 MG capsule; Take 1  capsule (10 mg total) by mouth 2 (two) times daily. -     dicyclomine (BENTYL) 10 MG capsule; Take 1 capsule (10 mg total) by mouth 2 (two) times daily.  Essential hypertension:  -     Discontinue: losartan (COZAAR) 50 MG tablet; Take 1 tablet (50 mg total) by mouth daily. -     losartan (COZAAR) 50 MG tablet; Take 1 tablet (50 mg total) by mouth daily.  Chronic midline low back pain without sciatica -     Discontinue: cyclobenzaprine (FLEXERIL) 5 MG tablet; Take 1 tablet (5 mg total) by mouth 3 (three) times daily. -     cyclobenzaprine (FLEXERIL) 5 MG tablet; Take 1 tablet (5 mg total) by mouth 3 (three) times daily.  Heartburn: He was initially started on 2 daily given history of NSAID therapy.  He is using less now so it makes since to decrease his H2.  -     Discontinue: famotidine (PEPCID) 20 MG tablet; Take 1 tablet (20 mg total) by mouth 2 (two) times daily. -     famotidine (PEPCID) 20 MG tablet; Take 1 tablet (20 mg total) by mouth 2 (two) times daily.  Other orders -     Fluocinolone Acetonide 0.01 % SHAM; Apply morning and night.  Apply less often when rash and flaking is controlled.    The patient is advised to call or return to clinic if he does not see an improvement in symptoms, or to seek the care of the closest emergency department if he worsens with the above plan.   Philis Fendt, MHS, PA-C [facility] 12/15/2016 1:22 PM

## 2016-12-14 NOTE — Patient Instructions (Signed)
     IF you received an x-ray today, you will receive an invoice from [facility]. Please contact [facility] at [phone] with questions or concerns regarding your invoice.   IF you received labwork today, you will receive an invoice from [facility]. Please contact [facility] at [phone] with questions or concerns regarding your invoice.   Our billing staff will not be able to assist you with questions regarding bills from these companies.  You will be contacted with the lab results as soon as they are available. The fastest way to get your results is to activate your My Chart account. Instructions are located on the last page of this paperwork. If you have not heard from us regarding the results in 2 weeks, please contact this office.     

## 2016-12-15 ENCOUNTER — Telehealth: Payer: Self-pay | Admitting: Physician Assistant

## 2016-12-15 NOTE — Telephone Encounter (Signed)
PATIENT SAW MICHAEL CLARK Thursday (12/14/16) FOR A SCALP RASH. HE PRESCRIBED HIM TO HAVE FLUOCINOLONE ACETONIDE 0.01 % SHAMPOO. THE COST IS $180.00. THE PHARMACIST TOLD HIM THAT MAYBE MICHAEL CAN GIVE HIM A CREAM OR OIL? BEST PHONE [phone] (CELL) PHARMACY CHOICE IS [facility] ON [address]. [place]

## 2016-12-15 NOTE — Telephone Encounter (Signed)
Call placed to patient to make him aware that PA Carlis Abbott spoke with pharmacy and verbally ordered a more affordable option. Patient verbalized understanding./ S.Brittish Bolinger,CMA

## 2016-12-15 NOTE — Telephone Encounter (Signed)
Please call the pharmacy and I will talk to the pharmacist about an affordable equivalent. Philis Fendt, MS, PA-C 10:05 AM, 12/15/2016

## 2017-04-30 ENCOUNTER — Ambulatory Visit: Payer: Federal, State, Local not specified - PPO | Admitting: Physician Assistant

## 2017-05-02 ENCOUNTER — Encounter: Payer: Self-pay | Admitting: Physician Assistant

## 2017-05-02 ENCOUNTER — Other Ambulatory Visit: Payer: Self-pay

## 2017-05-02 ENCOUNTER — Ambulatory Visit (INDEPENDENT_AMBULATORY_CARE_PROVIDER_SITE_OTHER): Payer: Federal, State, Local not specified - PPO | Admitting: Physician Assistant

## 2017-05-02 VITALS — BP 128/82 | HR 87 | Temp 98.8°F | Resp 16 | Ht 67.0 in | Wt 218.8 lb

## 2017-05-02 DIAGNOSIS — L089 Local infection of the skin and subcutaneous tissue, unspecified: Secondary | ICD-10-CM | POA: Diagnosis not present

## 2017-05-02 DIAGNOSIS — K582 Mixed irritable bowel syndrome: Secondary | ICD-10-CM | POA: Diagnosis not present

## 2017-05-02 DIAGNOSIS — Z8614 Personal history of Methicillin resistant Staphylococcus aureus infection: Secondary | ICD-10-CM | POA: Diagnosis not present

## 2017-05-02 MED ORDER — BENTYL 20 MG PO TABS: 20.0000 mg | ORAL_TABLET | Freq: Two times a day (BID) | ORAL | 2 refills | Status: DC

## 2017-05-02 MED ORDER — DOXYCYCLINE HYCLATE 100 MG PO CAPS: 100.0000 mg | ORAL_CAPSULE | Freq: Two times a day (BID) | ORAL | 0 refills | Status: DC

## 2017-05-02 NOTE — Patient Instructions (Addendum)
Work on stress relieving techniques to help you relax.  Come back if you are not improving with Bentyl.   Thank you for coming in today. I hope you feel we met your needs.  Feel free to call PCP if you have any questions or further requests.  Please consider signing up for MyChart if you do not already have it, as this is a great way to communicate with me.  Best,  Whitney McVey, PA-C    Diet for Irritable Bowel Syndrome When you have irritable bowel syndrome (IBS), the foods you eat and your eating habits are very important. IBS may cause various symptoms, such as abdominal pain, constipation, or diarrhea. Choosing the right foods can help ease discomfort caused by these symptoms. Work with your health care provider and dietitian to find the best eating plan to help control your symptoms. What general guidelines do I need to follow?  Keep a food diary. This will help you identify foods that cause symptoms. Write down: ? What you eat and when. ? What symptoms you have. ? When symptoms occur in relation to your meals.  Avoid foods that cause symptoms. Talk with your dietitian about other ways to get the same nutrients that are in these foods.  Eat more foods that contain fiber. Take a fiber supplement if directed by your dietitian.  Eat your meals slowly, in a relaxed setting.  Aim to eat 5-6 small meals per day. Do not skip meals.  Drink enough fluids to keep your urine clear or pale yellow.  Ask your health care provider if you should take an over-the-counter probiotic during flare-ups to help restore healthy gut bacteria.  If you have cramping or diarrhea, try making your meals low in fat and high in carbohydrates. Examples of carbohydrates are pasta, rice, whole grain breads and cereals, fruits, and vegetables.  If dairy products cause your symptoms to flare up, try eating less of them. You might be able to handle yogurt better than other dairy products because it contains  bacteria that help with digestion. What foods are not recommended? The following are some foods and drinks that may worsen your symptoms:  Fatty foods, such as [place] fries.  Milk products, such as cheese or ice cream.  Chocolate.  Alcohol.  Products with caffeine, such as coffee.  Carbonated drinks, such as soda.  The items listed above may not be a complete list of foods and beverages to avoid. Contact your dietitian for more information. What foods are good sources of fiber? Your health care provider or dietitian may recommend that you eat more foods that contain fiber. Fiber can help reduce constipation and other IBS symptoms. Add foods with fiber to your diet a little at a time so that your body can get used to them. Too much fiber at once might cause gas and swelling of your abdomen. The following are some foods that are good sources of fiber:  Apples.  Peaches.  Pears.  Berries.  Figs.  Broccoli (raw).  Cabbage.  Carrots.  Raw peas.  Kidney beans.  Lima beans.  Whole grain bread.  Whole grain cereal.  Where to find more information: [employer] for Functional Gastrointestinal Disorders: www.iffgd.[employer] of Diabetes and Digestive and Kidney Diseases: NetworkAffair.co.za.aspx This information is not intended to replace advice given to you by your health care provider. Make sure you discuss any questions you have with your health care provider. Document Released: 08/05/2003 Document Revised: 10/21/2015 Document Reviewed: 08/15/2013 Elsevier  Interactive Patient Education  [employer].   IF you received an x-ray today, you will receive an invoice from [facility]. Please contact [facility] at [phone] with questions or concerns regarding your invoice.   IF you received labwork today, you will receive an invoice from [facility]. Please  contact [facility] at [phone] with questions or concerns regarding your invoice.   Our billing staff will not be able to assist you with questions regarding bills from these companies.  You will be contacted with the lab results as soon as they are available. The fastest way to get your results is to activate your My Chart account. Instructions are located on the last page of this paperwork. If you have not heard from [place] regarding the results in 2 weeks, please contact this office.

## 2017-05-02 NOTE — Progress Notes (Signed)
Christopher [name]  MRN: [id number] DOB: 01-29-1966  PCP: Mancel Bale, PA-C  Subjective:  Pt is a pleasant 51 year old male PMH HTN, OSA, IBS, psoriasis h/o MRSA, obesity who presents to clinic for bumps on his face.   H/o MRSA infection - C/o waning MRSA flare-up of the left side of his face. Started 2 weeks ago. His current flare is now improving. He takes Doxy for this problem once or twice a year "when the infection gets deep" Otherwise he treats minor flares with mupirocin ointment. Changes razors weekly.  He suspects he infected his 45 year old daughter with MRSA last year.  Flare-ups seem to often coincide with steroid injections of his back. Last OV for this problem 09/27/2016. Wound culture at that time was negative.  Pt was seen in the office in 01/2016 for MRSA infection that spread to finger requiring I&D by hand surgeon. Wound culture 01/2016 showed MRSA that was sensative to tetracycline and trimeth/sulfa. He is an Emergency planning/management officer.    H/o IBS x 5 years  - last OV for this problem 10/18/2016. Rx bentyl 10mg  bid. This dose is not working for him. He started taking 20mg  twice daily, this is working better. He feels like he is always stressed out. He has appt with GI provider at [facility] later this month.   Review of Systems  Constitutional: Negative for chills, diaphoresis, fatigue and fever.  Gastrointestinal: Positive for abdominal pain, constipation and diarrhea.  Skin: Positive for rash.    Patient Active Problem List   Diagnosis Date Noted  . IBS (irritable bowel syndrome) 10/18/2016  . Sleep apnea 08/31/2015  . HTN (hypertension) 08/19/2015  . History of MRSA infection 08/19/2015  . Psoriasis 08/19/2015  . Elevated cholesterol 08/19/2015  . Obesity, Class II, BMI 35-39.9 08/19/2015  . Chronic back pain 03/25/2015    Current Outpatient Medications on File Prior to Visit  Medication Sig Dispense Refill  . aspirin 81 MG tablet Take 81 mg by mouth daily.    . cyclobenzaprine  (FLEXERIL) 5 MG tablet Take 1 tablet (5 mg total) by mouth 3 (three) times daily. 270 tablet 1  . dicyclomine (BENTYL) 10 MG capsule Take 1 capsule (10 mg total) by mouth 2 (two) times daily. 180 capsule 1  . losartan (COZAAR) 50 MG tablet Take 1 tablet (50 mg total) by mouth daily. 90 tablet 1  . omega-3 acid ethyl esters (LOVAZA) 1 g capsule Take 1 capsule (1 g total) by mouth 2 (two) times daily. 180 capsule 1  . travoprost, benzalkonium, (TRAVATAN) 0.004 % ophthalmic solution 1 drop at bedtime.    . famotidine (PEPCID) 20 MG tablet Take 1 tablet (20 mg total) by mouth 2 (two) times daily. (Patient not taking: Reported on 05/02/2017) 90 tablet 1  . Fluocinolone Acetonide 0.01 % SHAM Apply morning and night.  Apply less often when rash and flaking is controlled. (Patient not taking: Reported on 05/02/2017) 120 mL 3   No current facility-administered medications on file prior to visit.     Allergies  Allergen Reactions  . Biaxin [Clarithromycin] Shortness Of Breath     Objective:  BP 128/82   Pulse 87   Temp 98.8 F (37.1 C) (Oral)   Resp 16   Ht 5\' 7"  (1.702 m)   Wt 218 lb 12.8 oz (99.2 kg)   SpO2 98%   BMI 34.27 kg/m   Physical Exam  Constitutional: He is oriented to person, place, and time and well-developed, well-nourished,  and in no distress. No distress.  HENT:  Head:    Neurological: He is alert and oriented to person, place, and time. GCS score is 15.  Skin: Skin is warm and dry.  Psychiatric: Mood, memory, affect and judgment normal.  Vitals reviewed.   Assessment and Plan :  1. History of MRSA infection 2. Skin infection - doxycycline (VIBRAMYCIN) 100 MG capsule; Take 1 capsule (100 mg total) by mouth 2 (two) times daily.  Dispense: 20 capsule; Refill: 0 - Pt has h/o MRSA infections. Treats minor flare-ups with mupirocin ointments. Current episode is resolving. Plan to Rx Doxy for future flare up.    3. Irritable bowel syndrome with both constipation and  diarrhea - BENTYL 20 MG tablet; Take 1 tablet (20 mg total) by mouth 2 (two) times daily.  Dispense: 60 tablet; Refill: 2 - Will increase dose of Bentyl from 10mg  to 20mg . He has appt with [facility] later this month, plan to f/u there.   Mercer Pod, PA-C  [facility] 05/02/2017 8:21 AM

## 2017-05-13 ENCOUNTER — Other Ambulatory Visit: Payer: Self-pay | Admitting: Physician Assistant

## 2017-05-13 DIAGNOSIS — M545 Low back pain, unspecified: Secondary | ICD-10-CM

## 2017-05-13 DIAGNOSIS — G8929 Other chronic pain: Secondary | ICD-10-CM

## 2017-05-15 ENCOUNTER — Ambulatory Visit (INDEPENDENT_AMBULATORY_CARE_PROVIDER_SITE_OTHER): Payer: Federal, State, Local not specified - PPO | Admitting: Gastroenterology

## 2017-05-15 ENCOUNTER — Encounter: Payer: Self-pay | Admitting: Gastroenterology

## 2017-05-15 VITALS — BP 132/100 | HR 76 | Ht 65.25 in | Wt 218.2 lb

## 2017-05-15 DIAGNOSIS — K580 Irritable bowel syndrome with diarrhea: Secondary | ICD-10-CM

## 2017-05-15 NOTE — Progress Notes (Signed)
[facility] Progress Note  Chief Complaint: IBS  Subjective  History:  Christopher [name] follows up with me today regarding his long-standing IBS.  We discussed some of his previous visits when he saw me for an anal fissure.  He reports an endoscopy and colonoscopy while living in [place] several years ago.  He does believe there is a significant stress component, both from his job and some family issues related to his wife's oldest child.  He describes a "sloshing" and feeling of liquid and gas passing through the intestine.  He sometimes has 4-5 BMs per day with some urgency and gas.  There was initially significant improvement with 10 mg dicyclomine given by primary care about 6 months ago, it then seemed to lose some effect, and he increase the dose to 20 mg just several days ago. He denies rectal bleeding, his appetite is good and his weight stable.  ROS: Cardiovascular:  no chest pain Respiratory: no dyspnea  The patient's Past Medical, Family and Social History were reviewed and are on file in the EMR.  Objective:  Med list reviewed  Current Outpatient Medications:  .  aspirin 81 MG tablet, Take 81 mg by mouth daily., Disp: , Rfl:  .  BENTYL 20 MG tablet, Take 1 tablet (20 mg total) by mouth 2 (two) times daily., Disp: 60 tablet, Rfl: 2 .  cyclobenzaprine (FLEXERIL) 5 MG tablet, Take 1 tablet (5 mg total) by mouth 3 (three) times daily. (Patient taking differently: Take 5 mg by mouth as needed. ), Disp: 270 tablet, Rfl: 1 .  ibuprofen (ADVIL,MOTRIN) 800 MG tablet, Take 800 mg by mouth daily as needed., Disp: , Rfl:  .  losartan (COZAAR) 50 MG tablet, Take 1 tablet (50 mg total) by mouth daily., Disp: 90 tablet, Rfl: 1 .  omega-3 acid ethyl esters (LOVAZA) 1 g capsule, Take 1 capsule (1 g total) by mouth 2 (two) times daily., Disp: 180 capsule, Rfl: 1 .  travoprost, benzalkonium, (TRAVATAN) 0.004 % ophthalmic solution, 1 drop at bedtime., Disp: , Rfl:  .  doxycycline (VIBRAMYCIN) 100  MG capsule, Take 1 capsule (100 mg total) by mouth 2 (two) times daily. (Patient not taking: Reported on 05/15/2017), Disp: 20 capsule, Rfl: 0   Vital signs in last 24 hrs: Vitals:   05/15/17 1524  BP: (!) 132/100  Pulse: 76    Physical Exam    HEENT: sclera anicteric, oral mucosa moist without lesions  Neck: supple, no thyromegaly, JVD or lymphadenopathy  Cardiac: RRR without murmurs, S1S2 heard, no peripheral edema  Pulm: clear to auscultation bilaterally, normal RR and effort noted  Abdomen: soft, no tenderness, with active bowel sounds. No guarding or palpable hepatosplenomegaly.  Skin; warm and dry, no jaundice or rash    @ASSESSMENTPLANBEGIN @ Assessment: Encounter Diagnosis  Name Primary?  . Irritable bowel syndrome with diarrhea Yes   He has classic symptoms of IBS, and I have encouraged him to try the higher dose of dicyclomine for 2-3 weeks to see if there is improvement.  If not, he will contact me and we can try either hyoscyamine or Viberzi (since he has not had a cholecystectomy).   I gave him some written dietary advice, and I think stress reduction would likely be very helpful for him. He will see me as needed, certainly by next June when he is due for screening colonoscopy.  Total time 15 minutes, over half spent in counseling and coordination of care.   Christopher [name]

## 2017-05-15 NOTE — Patient Instructions (Addendum)
If you are age 49 or older, your body mass index should be between 23-30. Your Body mass index is 36.04 kg/m. If this is out of the aforementioned range listed, please consider follow up with your Primary Care Provider.  If you are age 22 or younger, your body mass index should be between 19-25. Your Body mass index is 36.04 kg/m. If this is out of the aformentioned range listed, please consider follow up with your Primary Care Provider.    Food Guidelines for a sensitive stomach  Many people have difficulty digesting certain foods, causing a variety of distressing and embarrassing symptoms such as abdominal pain, bloating and gas.  These foods may need to be avoided or consumed in small amounts.  Here are some tips that might be helpful for you.  1.   Lactose intolerance is the difficulty or complete inability to digest lactose, the natural sugar in milk and anything made from milk.  This condition is harmless, common, and can begin any time during life.  Some people can digest a modest amount of lactose while others cannot tolerate any.  Also, not all dairy products contain equal amounts of lactose.  For example, hard cheeses such as parmesan have less lactose than soft cheeses such as cheddar.  Yogurt has less lactose than milk or cheese.  Many packaged foods (even many brands of bread) have milk, so read ingredient lists carefully.  It is difficult to test for lactose intolerance, so just try avoiding lactose as much as possible for a week and see what happens with your symptoms.  If you seem to be lactose intolerant, the best plan is to avoid it (but make sure you get calcium from another source).  The next best thing is to use lactase enzyme supplements, available over the counter everywhere.  Just know that many lactose intolerant people need to take several tablets with each serving of dairy to avoid symptoms.  Lastly, a lot of restaurant food is made with milk or butter.  Many are things you  might not suspect, such as mashed potatoes, rice and pasta (cooked with butter) and "grilled" items.  If you are lactose intolerant, it never hurts to ask your server what has milk or butter.  2.   Fiber is an important part of your diet, but not all fiber is well-tolerated.  Insoluble fiber such as bran is often consumed by normal gut bacteria and converted into gas.  Soluble fiber such as oats, squash, carrots and green beans are typically tolerated better.  3.   Some types of carbohydrates can be poorly digested.  Examples include: fructose (apples, cherries, pears, raisins and other dried fruits), fructans (onions, zucchini, large amounts of wheat), sorbitol/mannitol/xylitol and sucralose/Splenda (common artificial sweeteners), and raffinose (lentils, broccoli, cabbage, asparagus, brussel sprouts, many types of beans).  Do a Development worker, community for [employer] and you will find helpful information. Beano, a dietary supplement, will often help with raffinose-containing foods.  As with lactase tablets, you may need several per serving.  4.   Whenever possible, avoid processed food&meats and chemical additives.  High fructose corn syrup, a common sweetener, may be difficult to digest.  Eggs and soy (comes from the soybean, and added to many foods now) are the other most common bloating/gassy foods.  - Dr. [facility]

## 2017-05-15 NOTE — Telephone Encounter (Signed)
Patient notified via My Chart.  Meds ordered this encounter  Medications  . ibuprofen (ADVIL,MOTRIN) 800 MG tablet    Sig: TAKE 1 TABLET (800 MG TOTAL) BY MOUTH EVERY 8 (EIGHT) HOURS AS NEEDED.    Dispense:  180 tablet    Refill:  0

## 2017-07-19 ENCOUNTER — Encounter: Payer: Self-pay | Admitting: Neurology

## 2017-07-19 ENCOUNTER — Ambulatory Visit: Payer: Federal, State, Local not specified - PPO | Admitting: Neurology

## 2017-07-19 ENCOUNTER — Telehealth: Payer: Self-pay | Admitting: Neurology

## 2017-07-19 VITALS — BP 148/97 | HR 77 | Ht 65.0 in | Wt 217.0 lb

## 2017-07-19 DIAGNOSIS — Z9989 Dependence on other enabling machines and devices: Secondary | ICD-10-CM | POA: Diagnosis not present

## 2017-07-19 DIAGNOSIS — G4733 Obstructive sleep apnea (adult) (pediatric): Secondary | ICD-10-CM | POA: Diagnosis not present

## 2017-07-19 NOTE — Telephone Encounter (Signed)
Myriam Jacobson, please copy to letterhead Christopher [name]    Christopher [name] is patient with a S 10  CPAP set at good therapeutic pressure, and has high compliance data. He has been 100% compliant with CPAP use,  We were able to review a 30 day download with 100% compliance on average user time of 8 hours and 91minutes, total time used in 30 days was 252 hours / and a residual AHI 1.1 /hr for a 30 day period of 1.7 at 14 cm water pressure with 1 cm expiratory pressure relief. I was also able to obtain a 90-day compliance report average use at home again 8 hours 36 minutes, same settings residual AHI 1.3, total usage hours 774 hours.  [employer]- DME  Christopher [name] shall be allowed to fly unrestricted. He is highly CPAP compliant at 100% , his medical conditions have been stable and he is compliant with medication as well.   Christopher [name] has definitely benefited from the treatment and  has no residual daytime sleepiness.  He continues to be alert, refreshed and restored after nocturnal sleep.   Asencion Partridge Cordon Gassett MD  07/19/2017  CC: Pete Glatter, letter  CC: Mancel Bale, [place], [place] [address]

## 2017-07-19 NOTE — Progress Notes (Signed)
SLEEP MEDICINE CLINIC   Provider:  Larey Seat, M D  Referring Provider: Mittie Bodo Primary Care Physician:  Mancel Bale, PA-C  Chief Complaint  Patient presents with  . Follow-up    pt alone, rm 10. pt states that CPAP is working ok. here for yearly follow up    HPI:  Christopher [name] is a 52 y.o. male , seen here as a referral from Dr. 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 today, following up on CPAP compliance.  The patient has used the machine 30 out of 30 days with 100% compliance 4 hours.  Average use is 8 hours and 24 minutes, CPAP is set at 14 cmH2O was 1 cm EPR, residual AHI is 1.1.  There are minor air leaks noted.  This machine is an air sense 10 CPAP that can also be used as and autotitrator and should be able to give data about obstructive or central apneas when necessary. The patient reports that he can hardly sleep without his CPAP, he is very much used to using one.  He endorsed an Epworth score at 0 points fatigue severity at 9 points.  Also endorsed at the lowest possible level.  The needs to be no adjustment made, I will follow the patient yearly.  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 The patient provided me with several copies of previous sleep studies. The patient also owns 2 machines- one of the normal longer downloadable through our software. [name]. presented with a sleep study from [place] [facility]. At the time in 7647 he was 52 year old gentleman referred for possible sleep disordered breathing he reached an AHI of 88 with an oxygen nadir of 64%, had loud snoring, no PLMS were noted. He was titrated to 15 cm water pressure he used a nasal  interface. This is a titration his AHI was reduced to 4.4 an improvement of over 90%. BiPAP was not attempted. On 15 cm the AHI was 0.8.  He then underwent a new titration on 11/12/2013 in [place], [place]. He was re-titrated to CPAP this time he had frequent periodic limb movements for an index of 20.4 but very few arousals. He was titrated to 14 cm water pressure but his AHI was 3.5. I would venture to say that he was under titrated.  The patient received a new  CPAP which he has used preferable since. He has used a new machine 329 out of 365 days which is at 90% compliance he has 100% compliance for the last 30 days his AHI is 1.7 on 14 cm water was 1 cm EPR. Average user time is 8 hours and 5 minutes. Serial number is [id number]. The patient endorsed today the Epworth Sleepiness Scale at 0 points. The patient is using CPAP now for almost a decade and is highly compliant.  Sleep habits are as follows: He goes to bed at 9:30 PM and usually falls asleep promptly. The bedroom is cool quiet and dark, he prefers to sleep on his side on one pillow. He rises at 5:30 with an alarm. Total sleep time is estimated to exceed 7-1/2 hours- more likely  8 hours. He feels refreshed and rested. Medical history ; HTN, obesity, high cholesterol. Social history: 3 stepdaughters, 1 biological.   Interval health history from 07/24/2016. I have the pleasure of seeing Christopher [name] here today who has been a highly compliant CPAP user. His CPAP compliance visit 100% for the last 30 days with an average of 8 hours and 21 minutes, his expiratory pressure relief is at 1 cm with 14 cm CPAP and his residual AHI is 1.6 he has only minor air leaks noted. The actual time used is 8 hours and 21 minutes per day the total used hours are 4559 hours since the machine was issued. The patient does not have diabetes, he has controlled hypertension with the help of only 1 medication, he does not have atrial fibrillation, no history of  strokes, epilepsy or seizures, and no significant change in weight since last seen.   Review of Systems: Out of a complete 14 system review, the patient complains of only the following symptoms, and all other reviewed systems are negative. Snoring, weight gain-    all remain the same. Epworth score  0 , Fatigue severity score 9  , depression score 0   Social History   Socioeconomic History  . Marital status: Married    Spouse name: Not on file  . Number of children: 4  . Years of education: Not on file  . Highest education level: Not on file  Social Needs  . Financial resource strain: Not on file  . Food insecurity - worry: Not on file  . Food insecurity - inability: Not on file  . Transportation needs - medical: Not on file  . Transportation needs - non-medical: Not on file  Occupational History  . Occupation: Engineer, production  Tobacco Use  . Smoking status: Never Smoker  . Smokeless tobacco: Never Used  Substance and Sexual Activity  . Alcohol use: No    Alcohol/week: 0.0 oz  . Drug use: No  . Sexual activity: No  Other Topics Concern  . Not on file  Social History Narrative  . Not on file    Family History  Problem Relation Age of Onset  . Hyperlipidemia Father   . Cancer Maternal Grandfather   . Diabetes Paternal Grandmother   . Heart disease Paternal Grandmother     Past Medical History:  Diagnosis Date  . Back pain   . Glaucoma   . Hypertension   . MRSA carrier     Past Surgical History:  Procedure Laterality Date  . 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 CERVICAL FUSION      Current Outpatient Medications  Medication Sig Dispense Refill  . aspirin 81 MG tablet Take 81 mg by mouth daily.    . cyclobenzaprine (FLEXERIL) 5 MG tablet Take 1 tablet (5 mg total) by mouth 3 (three) times daily. (Patient taking differently: Take 5 mg by mouth as needed. ) 270 tablet 1  . ibuprofen (ADVIL,MOTRIN) 800 MG tablet TAKE 1 TABLET (800 MG TOTAL) BY MOUTH EVERY 8  (EIGHT) HOURS AS NEEDED. 180 tablet 0  . losartan (COZAAR) 50 MG tablet Take 1 tablet (50 mg total) by mouth daily. 90 tablet 1  . omega-3 acid ethyl esters (LOVAZA) 1 g capsule Take 1 capsule (1 g total) by mouth 2 (two) times daily. 180 capsule 1  . travoprost, benzalkonium, (TRAVATAN) 0.004 % ophthalmic solution 1 drop at bedtime.    . BENTYL 20 MG tablet Take 1  tablet (20 mg total) by mouth 2 (two) times daily. 60 tablet 2   No current facility-administered medications for this visit.     Allergies as of 07/19/2017 - Review Complete 07/19/2017  Allergen Reaction Noted  . Biaxin [clarithromycin] Shortness Of Breath 03/25/2015    Vitals: BP (!) 148/97   Pulse 77   Ht 5\' 5"  (1.651 m)   Wt 217 lb (98.4 kg)   BMI 36.11 kg/m  Last Weight:  Wt Readings from Last 1 Encounters:  07/19/17 217 lb (98.4 kg)   IRS:WNIO mass index is 36.11 kg/m.     Last Height:   Ht Readings from Last 1 Encounters:  07/19/17 5\' 5"  (1.651 m)    Physical exam:  General: The patient is awake, alert and appears not in acute distress. The patient is well groomed.  Neck is supple. Mallampati 3,  neck circumference: 20. Nasal airflow unrestricted , Retrognathia is seen.  Cardiovascular:  Regular rate and rhythm, without  murmurs or carotid bruit, and without distended neck veins. Respiratory: Lungs are clear to auscultation. Skin:  Without evidence of edema, or rash.  Trunk: 36. The patient's posture is erect.  Neurologic exam : The patient is awake and alert, oriented to place and time.   Attention span & concentration ability appears normal.  Speech is fluent,  without dysarthria, dysphonia or aphasia.  Mood and affect are appropriate.  Cranial nerves: Intact smell and taste sense. Pupils are equal and briskly reactive to light.  Facial motor strength is symmetric, his  tongue and uvula move midline. Shoulder shrug was symmetrical. Motor exam: Tone, muscle bulk and strength intact in all  extremities.[name] tendon reflexes: in the  upper and lower extremities are symmetric and intact. Babinski maneuver response is downgoing.  The patient was advised of the nature of the diagnosed sleep disorder , the treatment options and risks for general a health and wellness arising from not treating the condition.  I spent more than 15 minutes of face to face time with the patient. Greater than 50% of time was spent in counseling and coordination of care. We have discussed the diagnosis and differential and I answered the patient's questions.    Assessment:  After physical and neurologic examination, review of laboratory studies,  Personal review of imaging studies, reports of other /same  Imaging studies ,  Results of polysomnography/ neurophysiology testing and pre-existing records as far as provided in visit., my assessment is   1) Christopher [name] is patient with a S 10  CPAP set at good therapeutic pressure, and has high compliance data. He has been 100% compliant with CPAP use,  We were able to review a 30 day download with 100% compliance on average user time of 8 hours and 70minutes, total time used in 30 days was 252 hours / and a residual AHI 1.1 /hr for a 30 day period of 1.7 at 14 cm water pressure with 1 cm expiratory pressure relief. I was also able to obtain a 90-day compliance report average use at home again 8 hours 36 minutes, same settings residual AHI 1.3, total usage hours 774 hours.  Christopher [name]- DME  Christopher [name] shall be allowed to fly unrestricted. He is highly CPAP compliant at 100% , his medical conditions have been stable and he is compliant with medication as well.   Christopher [name] has definitely benefited from the treatment and  has no residual daytime sleepiness.  He continues to be alert, refreshed and restored after nocturnal sleep.  Christopher Partridge Kinnley Paulson MD  07/19/2017  CC: Pete Glatter, letter  CC: Mancel Bale, [place], [place]  [id number]

## 2017-07-19 NOTE — Patient Instructions (Signed)
[facility]   [name] is patient with a S 10  CPAP set at good therapeutic pressure, and has high compliance data. He has been 100% compliant with CPAP use,  We were able to review a 30 day download with 100% compliance on average user time of 8 hours and 48minutes, total time used in 30 days was 252 hours / and a residual AHI 1.1 /hr for a 30 day period of 1.7 at 14 cm water pressure with 1 cm expiratory pressure relief. I was also able to obtain a 90-day compliance report average use at home again 8 hours 36 minutes, same settings residual AHI 1.3, total usage hours 774 hours.  Christopher [name]- DME  [name] shall be allowed to fly unrestricted. He is highly CPAP compliant at 100% , his medical conditions have been stable and he is compliant with medication as well.   [name] has definitely benefited from the treatment and  has no residual daytime sleepiness.  He continues to be alert, refreshed and restored after nocturnal sleep.   Larey Seat, MD ABSM, ABPN and accredited by the AASM.   07/19/2017  CC: FAA, letter  CC: Mancel Bale, [employer] [place], [place] [address]

## 2017-07-20 ENCOUNTER — Ambulatory Visit: Payer: Federal, State, Local not specified - PPO | Admitting: Physician Assistant

## 2017-07-20 ENCOUNTER — Encounter: Payer: Self-pay | Admitting: Physician Assistant

## 2017-07-20 ENCOUNTER — Other Ambulatory Visit: Payer: Self-pay

## 2017-07-20 VITALS — BP 140/88 | HR 72 | Temp 98.5°F | Resp 18 | Ht 65.0 in | Wt 214.6 lb

## 2017-07-20 DIAGNOSIS — L0201 Cutaneous abscess of face: Secondary | ICD-10-CM

## 2017-07-20 DIAGNOSIS — Z8614 Personal history of Methicillin resistant Staphylococcus aureus infection: Secondary | ICD-10-CM | POA: Diagnosis not present

## 2017-07-20 MED ORDER — MUPIROCIN 2 % EX OINT
1.0000 | TOPICAL_OINTMENT | Freq: Two times a day (BID) | CUTANEOUS | 1 refills | Status: DC
Start: 2017-07-20 — End: 2019-07-28

## 2017-07-20 MED ORDER — DOXYCYCLINE HYCLATE 100 MG PO TABS: 100.0000 mg | ORAL_TABLET | Freq: Two times a day (BID) | ORAL | 1 refills | Status: DC

## 2017-07-20 NOTE — Patient Instructions (Signed)
     IF you received an x-ray today, you will receive an invoice from [facility]. Please contact  Radiology at [phone] with questions or concerns regarding your invoice.   IF you received labwork today, you will receive an invoice from [facility]. Please contact [facility] at [phone] with questions or concerns regarding your invoice.   Our billing staff will not be able to assist you with questions regarding bills from these companies.  You will be contacted with the lab results as soon as they are available. The fastest way to get your results is to activate your My Chart account. Instructions are located on the last page of this paperwork. If you have not heard from us regarding the results in 2 weeks, please contact this office.     

## 2017-07-20 NOTE — Progress Notes (Signed)
Christopher [name]  MRN: [id number] DOB: 04-25-1966  PCP: Mancel Bale, PA-C  Chief Complaint  Patient presents with  . breakout    mrsa breakout on face x2weeks     Subjective:  Pt presents to clinic for abscess on his left cheek.  He has h/o MRSA in the past - last abscess that as cultured was not MRSA.    12/5 - MRSA - on doxy - went away with abx 2/6 - started as small bumps - like razor shave bumps 2/13 - steroid back injections - then it got larger and deeper  Cleans his razor with ETOH after each time. Daughter has MRSA (she is 77 y/o) they seem to pass back and forth and have it at the same time.  He has never seen ID.  He has used bactroban nasally as well on on his wounds.  History is obtained by patient.  Review of Systems  Constitutional: Negative for chills and fever.  Skin: Positive for wound.    Patient Active Problem List   Diagnosis Date Noted  . IBS (irritable bowel syndrome) 10/18/2016  . Sleep apnea 08/31/2015  . HTN (hypertension) 08/19/2015  . History of MRSA infection 08/19/2015  . Psoriasis 08/19/2015  . Elevated cholesterol 08/19/2015  . Obesity, Class II, BMI 35-39.9 08/19/2015  . Chronic back pain 03/25/2015    Current Outpatient Medications on File Prior to Visit  Medication Sig Dispense Refill  . aspirin 81 MG tablet Take 81 mg by mouth daily.    . cyclobenzaprine (FLEXERIL) 5 MG tablet Take 1 tablet (5 mg total) by mouth 3 (three) times daily. (Patient taking differently: Take 5 mg by mouth as needed. ) 270 tablet 1  . ibuprofen (ADVIL,MOTRIN) 800 MG tablet TAKE 1 TABLET (800 MG TOTAL) BY MOUTH EVERY 8 (EIGHT) HOURS AS NEEDED. 180 tablet 0  . losartan (COZAAR) 50 MG tablet Take 1 tablet (50 mg total) by mouth daily. 90 tablet 1  . omega-3 acid ethyl esters (LOVAZA) 1 g capsule Take 1 capsule (1 g total) by mouth 2 (two) times daily. 180 capsule 1  . travoprost, benzalkonium, (TRAVATAN) 0.004 % ophthalmic solution 1 drop at bedtime.    .  BENTYL 20 MG tablet Take 1 tablet (20 mg total) by mouth 2 (two) times daily. 60 tablet 2   No current facility-administered medications on file prior to visit.     Allergies  Allergen Reactions  . Biaxin [Clarithromycin] Shortness Of Breath    Past Medical History:  Diagnosis Date  . Back pain   . Glaucoma   . Hypertension   . MRSA carrier    Social History   Social History Narrative  . Not on file   Social History   Tobacco Use  . Smoking status: Never Smoker  . Smokeless tobacco: Never Used  Substance Use Topics  . Alcohol use: No    Alcohol/week: 0.0 oz  . Drug use: No   family history includes Cancer in his maternal grandfather; Diabetes in his paternal grandmother; Heart disease in his paternal grandmother; Hyperlipidemia in his father.     Objective:  BP 140/88   Pulse 72   Temp 98.5 F (36.9 C) (Oral)   Resp 18   Ht 5\' 5"  (1.651 m)   Wt 214 lb 9.6 oz (97.3 kg)   SpO2 96%   BMI 35.71 kg/m  Body mass index is 35.71 kg/m.  Physical Exam  Constitutional: He is oriented to person, place, and time  and well-developed, well-nourished, and in no distress.  HENT:  Head: Normocephalic and atraumatic.    Right Ear: External ear normal.  Left Ear: External ear normal.  Eyes: Conjunctivae are normal.  Neck: Normal range of motion.  Pulmonary/Chest: Effort normal.  Lymphadenopathy:    He has no cervical adenopathy.  Neurological: He is alert and oriented to person, place, and time. Gait normal.  Skin: Skin is warm and dry.  Psychiatric: Mood, memory, affect and judgment normal.    Assessment and Plan :  Abscess of face - Plan: doxycycline (VIBRA-TABS) 100 MG tablet, mupirocin ointment (BACTROBAN) 2 %, WOUND CULTURE  History of MRSA infection - Plan: mupirocin ointment (BACTROBAN) 2 %   Treat for MRSA but check culture as this will help our future treatment of his abscess.  Warm compresses - keep covered.  Windell Hummingbird PA-C  [facility] at  [facility] 07/20/2017 12:11 PM

## 2017-07-23 LAB — WOUND CULTURE: Organism ID, Bacteria: NONE SEEN

## 2017-07-25 ENCOUNTER — Ambulatory Visit: Payer: Federal, State, Local not specified - PPO | Admitting: Neurology

## 2017-08-17 ENCOUNTER — Other Ambulatory Visit: Payer: Self-pay | Admitting: Physician Assistant

## 2017-08-17 DIAGNOSIS — K582 Mixed irritable bowel syndrome: Secondary | ICD-10-CM

## 2017-08-23 ENCOUNTER — Telehealth: Payer: Self-pay | Admitting: *Deleted

## 2017-08-24 ENCOUNTER — Telehealth: Payer: Self-pay | Admitting: Physician Assistant

## 2017-08-24 DIAGNOSIS — I10 Essential (primary) hypertension: Secondary | ICD-10-CM

## 2017-08-24 MED ORDER — LOSARTAN POTASSIUM 50 MG PO TABS: 50.0000 mg | ORAL_TABLET | Freq: Every day | ORAL | 1 refills | Status: DC

## 2017-08-24 NOTE — Telephone Encounter (Signed)
Copied from [place] [id number]. Topic: Quick Communication - Rx Refill/Question >> Aug 24, 2017  9:25 AM Antonieta Iba C wrote: Medication: losartan (COZAAR) 50 MG tablet- pt is completely out of his medication and is going away on business for 2 weeks.   Has the patient contacted their pharmacy? Yes- pt says that they was advised to call office directly.   (Agent: If no, request that the patient contact the pharmacy for the refill.)  [facility]erred [facility] phone [facility] [address]   Agent: Please be advised that RX refills may take up to 3 business days. We ask that you follow-up with your pharmacy.

## 2017-08-24 NOTE — Telephone Encounter (Signed)
Losartan (Cozaar) refill Last OV: 12/14/16 Last Refill:12/14/16 #90 1 RF [facility] [address] PCP: Windell Hummingbird PA

## 2017-09-03 ENCOUNTER — Other Ambulatory Visit: Payer: Self-pay | Admitting: Physician Assistant

## 2017-09-03 DIAGNOSIS — K582 Mixed irritable bowel syndrome: Secondary | ICD-10-CM

## 2017-09-03 NOTE — Telephone Encounter (Signed)
Pt noted to be seen by GI who prescribed this medication previously. Clarification needed to see if pt is still being followed by GI doctor.

## 2017-09-03 NOTE — Telephone Encounter (Signed)
Copied from [place] [id number]. Topic: Quick Communication - Rx Refill/Question >> Sep 03, 2017  4:58 PM Clack, Laban Emperor wrote: Medication: dicyclomine (BENTYL) 10 MG capsule [[id number]]  Has the patient contacted their pharmacy? Yes.   (Agent: If no, request that the patient contact the pharmacy for the refill.) Preferred Pharmacy (with phone number or street name): [id number], [place] [phone] (Phone) [phone] (Fax)   Pt states he is out of the medication and he is out of town and would like it sent to the [facility] listed [facility].  Agent: Please be advised that RX refills may take up to 3 business days. We ask that you follow-up with your pharmacy.

## 2017-09-04 MED ORDER — BENTYL 20 MG PO TABS: 20.0000 mg | ORAL_TABLET | Freq: Two times a day (BID) | ORAL | 0 refills | Status: DC

## 2017-09-04 NOTE — Telephone Encounter (Signed)
Pt calling back and stating prescription was originally written by Judson Roch, PA but was increased by the GI doctor. Pt states he is currently taking Bentyl 20mg   twice a day. Prescription with the correct dosage of medication expired on 06/01/17. Pt asking if prescription is going to be refilled by Thursday, [facility] [place]. Pt is leaving [place] on Friday and if the prescription is going to be sent in after that time he is requesting [facility] [address].

## 2017-09-04 NOTE — Addendum Note (Signed)
Addended by: Raymon Mutton on: 09/04/2017 04:35 PM   Modules accepted: Orders

## 2017-09-04 NOTE — Telephone Encounter (Signed)
Left message for pt to call office back regarding refill request. Medication was previously filled by GI physician.

## 2017-09-04 NOTE — Addendum Note (Signed)
Addended by: Delle Reining on: 09/04/2017 01:37 PM   Modules accepted: Orders

## 2017-09-10 ENCOUNTER — Ambulatory Visit: Payer: Federal, State, Local not specified - PPO | Admitting: Physician Assistant

## 2017-09-10 ENCOUNTER — Other Ambulatory Visit: Payer: Self-pay

## 2017-09-10 ENCOUNTER — Encounter: Payer: Self-pay | Admitting: Physician Assistant

## 2017-09-10 VITALS — BP 130/72 | HR 91 | Temp 98.0°F | Resp 16 | Ht 65.0 in | Wt 219.0 lb

## 2017-09-10 DIAGNOSIS — E7800 Pure hypercholesterolemia, unspecified: Secondary | ICD-10-CM

## 2017-09-10 DIAGNOSIS — I10 Essential (primary) hypertension: Secondary | ICD-10-CM

## 2017-09-10 DIAGNOSIS — M545 Low back pain: Secondary | ICD-10-CM

## 2017-09-10 DIAGNOSIS — L0201 Cutaneous abscess of face: Secondary | ICD-10-CM

## 2017-09-10 DIAGNOSIS — D229 Melanocytic nevi, unspecified: Secondary | ICD-10-CM

## 2017-09-10 DIAGNOSIS — G8929 Other chronic pain: Secondary | ICD-10-CM | POA: Diagnosis not present

## 2017-09-10 MED ORDER — IBUPROFEN 800 MG PO TABS: 800.0000 mg | ORAL_TABLET | Freq: Three times a day (TID) | ORAL | 0 refills | Status: DC | PRN

## 2017-09-10 MED ORDER — DOXYCYCLINE HYCLATE 100 MG PO CAPS: 100.0000 mg | ORAL_CAPSULE | Freq: Two times a day (BID) | ORAL | 0 refills | Status: AC

## 2017-09-10 MED ORDER — OMEGA-3-ACID ETHYL ESTERS 1 G PO CAPS: 1.0000 g | ORAL_CAPSULE | Freq: Two times a day (BID) | ORAL | 3 refills | Status: DC

## 2017-09-10 MED ORDER — LOSARTAN POTASSIUM 50 MG PO TABS: 50.0000 mg | ORAL_TABLET | Freq: Every day | ORAL | 3 refills | Status: DC

## 2017-09-10 NOTE — Patient Instructions (Addendum)
Pick up some benzoil peroxide facial wash from the pharmacy (10% clean and clear.)    IF you received an x-ray today, you will receive an invoice from [facility]. Please contact [facility] at [phone] with questions or concerns regarding your invoice.   IF you received labwork today, you will receive an invoice from [facility]. Please contact [facility] at [phone] with questions or concerns regarding your invoice.   Our billing staff will not be able to assist you with questions regarding bills from these companies.  You will be contacted with the lab results as soon as they are available. The fastest way to get your results is to activate your My Chart account. Instructions are located on the last page of this paperwork. If you have not heard from [place] regarding the results in 2 weeks, please contact this office.

## 2017-09-10 NOTE — Progress Notes (Signed)
09/10/2017 2:53 PM   DOB: June 30, 1965 / MRN: [id number]  SUBJECTIVE:  Christopher [name] is a 52 y.o. male presenting for BP and dyslipdemia refills.  Does not miss doses. Tells me an abscess on his face is back.  History of MRSA.  No fever or chills today.  This is present for a few days now and worsening. Mole on his right arm is groing.   He is allergic to biaxin [clarithromycin].   He  has a past medical history of Back pain, Glaucoma, Hypertension, and MRSA carrier.    He  reports that he has never smoked. He has never used smokeless tobacco. He reports that he does not drink alcohol or use drugs. He  reports that he does not engage in sexual activity. The patient  has a past surgical history that includes Cervical fusion and carple tunnel in both hands .  His family history includes Cancer in his maternal grandfather; Diabetes in his paternal grandmother; Heart disease in his paternal grandmother; Hyperlipidemia in his father.  Review of Systems  Respiratory: Negative for cough.   Cardiovascular: Negative for chest pain and leg swelling.  Neurological: Negative for dizziness.    The problem list and medications were reviewed and updated by myself where necessary and exist elsewhere in the encounter.   OBJECTIVE:  BP 130/72   Pulse 91   Temp 98 F (36.7 C) (Oral)   Resp 16   Ht 5\' 5"  (1.651 m)   Wt 219 lb (99.3 kg)   SpO2 97%   BMI 36.44 kg/m   Wt Readings from Last 3 Encounters:  09/10/17 219 lb (99.3 kg)  07/20/17 214 lb 9.6 oz (97.3 kg)  07/19/17 217 lb (98.4 kg)   Temp Readings from Last 3 Encounters:  09/10/17 98 F (36.7 C) (Oral)  07/20/17 98.5 F (36.9 C) (Oral)  05/02/17 98.8 F (37.1 C) (Oral)   BP Readings from Last 3 Encounters:  09/10/17 130/72  07/20/17 140/88  07/19/17 (!) 148/97   Pulse Readings from Last 3 Encounters:  09/10/17 91  07/20/17 72  07/19/17 77     Physical Exam  Constitutional: He is oriented to person, place, and time. He  appears well-developed. He does not appear ill.  Eyes: Pupils are equal, round, and reactive to light. Conjunctivae and EOM are normal.  Cardiovascular: Normal rate, regular rhythm, S1 normal, S2 normal, normal heart sounds, intact distal pulses and normal pulses. Exam reveals no gallop and no friction rub.  No murmur heard. Pulmonary/Chest: Effort normal. No stridor. No respiratory distress. He has no wheezes. He has no rales.  Abdominal: He exhibits no distension.  Musculoskeletal: Normal range of motion. He exhibits no edema.  Neurological: He is alert and oriented to person, place, and time. No cranial nerve deficit. Coordination normal.  Skin: Skin is warm and dry. He is not diaphoretic.  Psychiatric: He has a normal mood and affect.  Nursing note and vitals reviewed.       Lab Results  Component Value Date   CREATININE 0.94 05/12/2016   BUN 11 05/12/2016   NA 144 05/12/2016   K 4.7 05/12/2016   CL 104 05/12/2016   CO2 22 05/12/2016   Lab Results  Component Value Date   CHOL 209 (H) 05/12/2016   HDL 41 05/12/2016   LDLCALC 146 (H) 05/12/2016   TRIG 109 05/12/2016   CHOLHDL 5.1 (H) 05/12/2016       No results found for this or any previous  visit (from the past 72 hour(s)).  No results found.  ASSESSMENT AND PLAN:  Corrado was seen today for mrsa infection and medication refill.  Diagnoses and all orders for this visit:  Facial abscess -     doxycycline (VIBRAMYCIN) 100 MG capsule; Take 1 capsule (100 mg total) by mouth 2 (two) times daily for 10 days.  Essential hypertension -     losartan (COZAAR) 50 MG tablet; Take 1 tablet (50 mg total) by mouth daily. -     Renal Function Panel -     Care order/instruction:  Elevated cholesterol -     omega-3 acid ethyl esters (LOVAZA) 1 g capsule; Take 1 capsule (1 g total) by mouth 2 (two) times daily. -     Lipid Panel  Chronic midline low back pain without sciatica -     ibuprofen (ADVIL,MOTRIN) 800 MG tablet;  Take 1 tablet (800 mg total) by mouth every 8 (eight) hours as needed.  Nevus -     Ambulatory referral to Dermatology    The patient is advised to call or return to clinic if he does not see an improvement in symptoms, or to seek the care of the closest emergency department if he worsens with the above plan.   Philis Fendt, MHS, PA-C [facility] 09/10/2017 2:53 PM

## 2017-09-11 LAB — RENAL FUNCTION PANEL
ALBUMIN: 4.8 g/dL (ref 3.5–5.5)
BUN/Creatinine Ratio: 14 (ref 9–20)
BUN: 13 mg/dL (ref 6–24)
CO2: 22 mmol/L (ref 20–29)
CREATININE: 0.96 mg/dL (ref 0.76–1.27)
Calcium: 9.8 mg/dL (ref 8.7–10.2)
Chloride: 101 mmol/L (ref 96–106)
GFR, EST AFRICAN AMERICAN: 107 mL/min/{1.73_m2} (ref >59)
GFR, EST NON AFRICAN AMERICAN: 92 mL/min/{1.73_m2} (ref >59)
GLUCOSE: 82 mg/dL (ref 65–99)
PHOSPHORUS: 4.5 mg/dL (ref 2.5–4.5)
Potassium: 3.9 mmol/L (ref 3.5–5.2)
Sodium: 143 mmol/L (ref 134–144)

## 2017-09-11 LAB — LIPID PANEL
CHOL/HDL RATIO: 7.6 ratio — AB (ref 0.0–5.0)
Cholesterol, Total: 237 mg/dL — ABNORMAL HIGH (ref 100–199)
HDL: 31 mg/dL — AB (ref >39)
LDL Calculated: 154 mg/dL — ABNORMAL HIGH (ref 0–99)
Triglycerides: 261 mg/dL — ABNORMAL HIGH (ref 0–149)
VLDL CHOLESTEROL CAL: 52 mg/dL — AB (ref 5–40)

## 2017-09-29 ENCOUNTER — Other Ambulatory Visit: Payer: Self-pay | Admitting: Physician Assistant

## 2017-09-29 DIAGNOSIS — L0201 Cutaneous abscess of face: Secondary | ICD-10-CM

## 2017-10-04 ENCOUNTER — Telehealth: Payer: Self-pay

## 2017-10-04 NOTE — Telephone Encounter (Signed)
Patient needs OV to discuss this.

## 2017-10-04 NOTE — Telephone Encounter (Signed)
mychart message sent to pt about appt

## 2017-10-04 NOTE — Telephone Encounter (Signed)
Copied from [place] [phone]. Topic: Referral - Request >> Oct 03, 2017  5:13 PM Arletha Grippe wrote: Reason for CRM: pt has had ringing in one ear for several days  He would like to have recommendation on who to see.  Please call [phone]

## 2017-10-05 ENCOUNTER — Other Ambulatory Visit: Payer: Self-pay | Admitting: Physician Assistant

## 2017-10-05 DIAGNOSIS — K582 Mixed irritable bowel syndrome: Secondary | ICD-10-CM

## 2017-10-05 NOTE — Telephone Encounter (Signed)
Copied from [place] [phone]. Topic: Quick Communication - Rx Refill/Question >> Oct 05, 2017 12:06 PM Marin Olp L wrote: Medication: dicyclomine 20mg  (BENTYL 20 MG tablet ) Has the patient contacted their pharmacy? Yes.   (Agent: If no, request that the patient contact the pharmacy for the refill.) Preferred Pharmacy (with phone number or street name): [id number] [place] [place] - [address] [place] [address] Phone: [phone] Fax: [phone] Agent: Please be advised that RX refills may take up to 3 business days. We ask that you follow-up with your pharmacy.

## 2017-10-05 NOTE — Telephone Encounter (Signed)
Last OV on 05/02/17 with Whitney McVey states, "- Will increase dose of Bentyl from 10mg  to 20mg . He has appt with [facility] later this month, plan to f/u there. "  Per OV with Dr. Loletha Carrow at [place] on 05/15/17, "He has classic symptoms of IBS, and I have encouraged him to try the higher dose of dicyclomine for 2-3 weeks to see if there is improvement."  Bentyl prescription pended for provider signature.

## 2017-10-05 NOTE — Telephone Encounter (Signed)
Refill request for Dicyclomine 20mg  (Bentyl), last filled on 09/04/17#60. Current prescription is expired.   LOV:05/02/17 Sarah Weber,PA  [facility]   [place]

## 2017-10-08 ENCOUNTER — Telehealth: Payer: Self-pay | Admitting: *Deleted

## 2017-10-08 NOTE — Telephone Encounter (Signed)
Left message at mobile number to call back with dosage of Bentyl he takes for IBS.

## 2017-10-08 NOTE — Telephone Encounter (Signed)
Please contact the patient - what dose of bentyl is he taking to control his symptoms - his last OV with Gi told him to increase to dose tolerant and I want to make sure he has enough.

## 2017-10-09 MED ORDER — BENTYL 20 MG PO TABS: 20.0000 mg | ORAL_TABLET | Freq: Two times a day (BID) | ORAL | 1 refills | Status: DC

## 2017-10-09 NOTE — Telephone Encounter (Signed)
Please advise your question was answered

## 2017-10-09 NOTE — Telephone Encounter (Signed)
Patient called and asked about the dosage of Bentyl he takes. He says he takes 20 mg BID and that dosage is working well for him. He asks can he get a 90 day supply with refills, so he doesn't have to call each month for refills. I advised this would be sent to the provider for review.

## 2017-10-15 NOTE — Telephone Encounter (Signed)
Patient returning call, call back [phone]

## 2017-10-15 NOTE — Telephone Encounter (Signed)
Phone call to patient. He states he is returning call to find out if Bentyl was sent to pharmacy.  Phone call to pharmacy. Pharmacy states medication is ready for pickup.  Phone call to patient. Patient notified - he is appreciative of call.

## 2017-10-31 ENCOUNTER — Encounter: Payer: Self-pay | Admitting: Physician Assistant

## 2017-10-31 ENCOUNTER — Ambulatory Visit (INDEPENDENT_AMBULATORY_CARE_PROVIDER_SITE_OTHER): Payer: Federal, State, Local not specified - PPO | Admitting: Physician Assistant

## 2017-10-31 VITALS — BP 138/98 | HR 83 | Temp 98.2°F | Ht 65.0 in | Wt 218.6 lb

## 2017-10-31 DIAGNOSIS — Z13220 Encounter for screening for lipoid disorders: Secondary | ICD-10-CM

## 2017-10-31 DIAGNOSIS — Z1329 Encounter for screening for other suspected endocrine disorder: Secondary | ICD-10-CM

## 2017-10-31 DIAGNOSIS — Z130 Encounter for screening for diseases of the blood and blood-forming organs and certain disorders involving the immune mechanism: Secondary | ICD-10-CM

## 2017-10-31 DIAGNOSIS — Z13228 Encounter for screening for other metabolic disorders: Secondary | ICD-10-CM | POA: Diagnosis not present

## 2017-10-31 DIAGNOSIS — Z1321 Encounter for screening for nutritional disorder: Secondary | ICD-10-CM

## 2017-10-31 DIAGNOSIS — Z113 Encounter for screening for infections with a predominantly sexual mode of transmission: Secondary | ICD-10-CM | POA: Diagnosis not present

## 2017-10-31 DIAGNOSIS — Z23 Encounter for immunization: Secondary | ICD-10-CM | POA: Diagnosis not present

## 2017-10-31 DIAGNOSIS — Z125 Encounter for screening for malignant neoplasm of prostate: Secondary | ICD-10-CM | POA: Diagnosis not present

## 2017-10-31 DIAGNOSIS — Z0000 Encounter for general adult medical examination without abnormal findings: Secondary | ICD-10-CM

## 2017-10-31 NOTE — Patient Instructions (Addendum)
  It was nice to see you again today Christopher [name].  I will be in touch with regard to your lab work and will call if there are any major problems.  If you do desire a referral to ENT for further work-up of your tinnitus just call the clinic and I will be happy to refer you based off of today's documentation.   IF you received an x-ray today, you will receive an invoice from [facility]. Please contact [facility] at [phone] with questions or concerns regarding your invoice.   IF you received labwork today, you will receive an invoice from [facility] [place]. Please contact [facility] at [phone] with questions or concerns regarding your invoice.   Our billing staff will not be able to assist you with questions regarding bills from these companies.  You will be contacted with the lab results as soon as they are available. The fastest way to get your results is to activate your My Chart account. Instructions are located on the last page of this paperwork. If you have not heard from [place] regarding the results in 2 weeks, please contact this office.

## 2017-10-31 NOTE — Progress Notes (Signed)
11/02/2017 10:51 AM   DOB: Apr 25, 1966 / MRN: [id number]  SUBJECTIVE:  Christopher [name] is a 52 y.o. male presenting for annual exam.  He feels well today.   after some discussion of the risk and benefits as well as controversy surrounding PSA screenings he wishes to proceed with his PSA check only.  He is a never smoker.  He does not drink alcohol and denies any history of IV drug use.  He has  of a family history of diabetes and heart disease.  He sleeps with CPAP and is compliant with therapy.  Takes losartan for blood pressure which appears to be well controlled historically.  Admits to not exercising enough.  He does complain of some left-sided tinnitus that is nonpulsatile.  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  He denies any pain, dizziness, weakness.  He prefers to watch and wait approach with regard to this problem.  Immunization History  Administered Date(s) Administered  . Influenza-Unspecified 02/21/2016, 03/21/2017  . Tdap 10/31/2017     He is allergic to biaxin [clarithromycin].   He  has a past medical history of Back pain, Glaucoma, Hypertension, and MRSA carrier.    He  reports that he has never smoked. He has never used smokeless tobacco. He reports that he does not drink alcohol or use drugs. He  reports that he does not engage in sexual activity. The patient  has a past surgical history that includes Cervical fusion and carple tunnel in both hands .  His family history includes Cancer in his maternal grandfather; Diabetes in his paternal grandmother; Heart disease in his paternal grandmother; Hyperlipidemia in his father.  Review of Systems  Constitutional: Negative for fever.  HENT: Positive for hearing loss and tinnitus. Negative for congestion, ear discharge, ear pain, nosebleeds, sinus pain and sore throat.   Respiratory: Negative for cough, shortness of breath and stridor.   Cardiovascular:  Negative for chest pain and leg swelling.  Gastrointestinal: Negative for nausea.  Musculoskeletal: Negative for myalgias.  Skin: Negative for itching and rash.  Neurological: Negative for dizziness.    The problem list and medications were reviewed and updated by myself where necessary and exist elsewhere in the encounter.   OBJECTIVE:  BP (!) 138/98 (BP Location: Left Arm, Patient Position: Sitting, Cuff Size: Large)   Pulse 83   Temp 98.2 F (36.8 C) (Oral)   Ht 5\' 5"  (1.651 m)   Wt 218 lb 9.6 oz (99.2 kg)   SpO2 99%   BMI 36.38 kg/m    Wt Readings from Last 3 Encounters:  10/31/17 218 lb 9.6 oz (99.2 kg)  09/10/17 219 lb (99.3 kg)  07/20/17 214 lb 9.6 oz (97.3 kg)   Temp Readings from Last 3 Encounters:  10/31/17 98.2 F (36.8 C) (Oral)  09/10/17 98 F (36.7 C) (Oral)  07/20/17 98.5 F (36.9 C) (Oral)   BP Readings from Last 3 Encounters:  10/31/17 (!) 138/98  09/10/17 130/72  07/20/17 140/88   Pulse Readings from Last 3 Encounters:  10/31/17 83  09/10/17 91  07/20/17 72    Physical Exam  Constitutional: He is oriented to person, place, and time. He appears well-developed. He does not appear ill.  HENT:  Right Ear: Hearing, tympanic membrane, external ear and ear canal normal.  Left Ear: Hearing, tympanic membrane, external ear and ear canal normal.  Nose: Nose normal. Right sinus exhibits no  maxillary sinus tenderness and no frontal sinus tenderness. Left sinus exhibits no maxillary sinus tenderness and no frontal sinus tenderness.  Mouth/Throat: Uvula is midline, oropharynx is clear and moist and mucous membranes are normal. No oropharyngeal exudate, posterior oropharyngeal edema or tonsillar abscesses.  Eyes: Pupils are equal, round, and reactive to light. Conjunctivae and EOM are normal.  Cardiovascular: Normal rate, regular rhythm, S1 normal, S2 normal, normal heart sounds, intact distal pulses and normal pulses. Exam reveals no gallop and no friction  rub.  No murmur heard. Pulmonary/Chest: Effort normal. No stridor. No respiratory distress. He has no wheezes. He has no rales.  Abdominal: He exhibits no distension.  Musculoskeletal: Normal range of motion. He exhibits no edema.  Lymphadenopathy:       Head (right side): No submandibular and no tonsillar adenopathy present.       Head (left side): No submandibular and no tonsillar adenopathy present.    He has no cervical adenopathy.  Neurological: He is alert and oriented to person, place, and time. He has normal strength and normal reflexes. He is not disoriented. No cranial nerve deficit or sensory deficit. He exhibits normal muscle tone. Coordination and gait normal.  Skin: Skin is warm and dry. He is not diaphoretic.  Psychiatric: He has a normal mood and affect. His behavior is normal.  Nursing note and vitals reviewed.   Results for orders placed or performed in visit on 10/31/17 (from the past 72 hour(s))  Lipid panel     Status: Abnormal   Collection Time: 10/31/17 11:24 AM  Result Value Ref Range   Cholesterol, Total 214 (H) 100 - 199 mg/dL   Triglycerides 165 (H) 0 - 149 mg/dL   HDL 38 (L) >39 mg/dL   VLDL Cholesterol Cal 33 5 - 40 mg/dL   LDL Calculated 143 (H) 0 - 99 mg/dL   Chol/HDL Ratio 5.6 (H) 0.0 - 5.0 ratio    Comment:                                   T. Chol/HDL Ratio                                             Men  Women                               1/2 Avg.Risk  3.4    3.3                                   Avg.Risk  5.0    4.4                                2X Avg.Risk  9.6    7.1                                3X Avg.Risk 23.4   29.5   Basic metabolic panel     Status: None   Collection Time: 10/31/17 11:24 AM  Result Value Ref Range   Glucose 95 65 -  99 mg/dL   BUN 12 6 - 24 mg/dL   Creatinine, Ser 1.01 0.76 - 1.27 mg/dL   GFR calc non Af Amer 86 >59 mL/min/1.73   GFR calc Af Amer 100 >59 mL/min/1.73   BUN/Creatinine Ratio 12 9 - 20   Sodium 140  134 - 144 mmol/L   Potassium 4.3 3.5 - 5.2 mmol/L   Chloride 103 96 - 106 mmol/L   CO2 22 20 - 29 mmol/L   Calcium 9.9 8.7 - 10.2 mg/dL  CBC     Status: None   Collection Time: 10/31/17 11:24 AM  Result Value Ref Range   WBC 7.2 3.4 - 10.8 x10E3/uL   RBC 5.38 4.14 - 5.80 x10E6/uL   Hemoglobin 16.0 13.0 - 17.7 g/dL   Hematocrit 46.5 37.5 - 51.0 %   MCV 86 79 - 97 fL   MCH 29.7 26.6 - 33.0 pg   MCHC 34.4 31.5 - 35.7 g/dL   RDW 13.5 12.3 - 15.4 %   Platelets 319 150 - 450 x10E3/uL  Hemoglobin A1c     Status: None   Collection Time: 10/31/17 11:24 AM  Result Value Ref Range   Hgb A1c MFr Bld 5.5 4.8 - 5.6 %    Comment:          Prediabetes: 5.7 - 6.4          Diabetes: >6.4          Glycemic control for adults with diabetes: <7.0    Est. average glucose Bld gHb Est-mCnc 111 mg/dL  PSA     Status: None   Collection Time: 10/31/17 11:24 AM  Result Value Ref Range   Prostate Specific Ag, Serum 1.3 0.0 - 4.0 ng/mL    Comment: Roche ECLIA methodology. According to the American Urological Association, Serum PSA should decrease and remain at undetectable levels after radical prostatectomy. The AUA defines biochemical recurrence as an initial PSA value 0.2 ng/mL or greater followed by a subsequent confirmatory PSA value 0.2 ng/mL or greater. Values obtained with different assay methods or kits cannot be used interchangeably. Results cannot be interpreted as absolute evidence of the presence or absence of malignant disease.   Microalbumin / creatinine urine ratio     Status: None   Collection Time: 10/31/17 11:36 AM  Result Value Ref Range   Creatinine, Urine 115.7 Not Estab. mg/dL   Microalbumin, Urine 4.1 Not Estab. ug/mL   Microalb/Creat Ratio 3.5 0.0 - 30.0 mg/g creat    Comment:                      Normal:                0.0 -  30.0                      Albuminuria:          31.0 - 300.0                      Clinical albuminuria:       >300.0   Urinalysis, dipstick only      Status: None   Collection Time: 10/31/17 11:36 AM  Result Value Ref Range   Specific Gravity, UA 1.018 1.005 - 1.030   pH, UA 5.5 5.0 - 7.5   Color, UA Yellow Yellow   Appearance Ur Clear Clear   Leukocytes, UA Negative Negative   Protein, UA Negative Negative/Trace  Glucose, UA Negative Negative   Ketones, UA Negative Negative   RBC, UA Negative Negative   Bilirubin, UA Negative Negative   Urobilinogen, Ur 0.2 0.2 - 1.0 mg/dL   Nitrite, UA Negative Negative    No results found.  ASSESSMENT AND PLAN:  Diagnoses and all orders for this visit:  Annual physical exam  Screening PSA (prostate specific antigen) -     PSA  Screening examination for sexually transmitted disease -     GC/Chlamydia Probe Amp  Screening, lipid -     Lipid panel  Screening for endocrine, nutritional, metabolic and immunity disorder -     Basic metabolic panel -     CBC -     Microalbumin / creatinine urine ratio -     Hemoglobin A1c -     Urinalysis, dipstick only  Need for diphtheria-tetanus-pertussis (Tdap) vaccine -     Tdap vaccine greater than or equal to 7yo IM    The patient is advised to call or return to clinic if he does not see an improvement in symptoms, or to seek the care of the closest emergency department if he worsens with the above plan.   Philis Fendt, MHS, PA-C [facility] 11/02/2017 10:51 AM

## 2017-11-01 LAB — BASIC METABOLIC PANEL
BUN/Creatinine Ratio: 12 (ref 9–20)
BUN: 12 mg/dL (ref 6–24)
CALCIUM: 9.9 mg/dL (ref 8.7–10.2)
CO2: 22 mmol/L (ref 20–29)
Chloride: 103 mmol/L (ref 96–106)
Creatinine, Ser: 1.01 mg/dL (ref 0.76–1.27)
GFR calc Af Amer: 100 mL/min/{1.73_m2} (ref >59)
GFR, EST NON AFRICAN AMERICAN: 86 mL/min/{1.73_m2} (ref >59)
GLUCOSE: 95 mg/dL (ref 65–99)
Potassium: 4.3 mmol/L (ref 3.5–5.2)
SODIUM: 140 mmol/L (ref 134–144)

## 2017-11-01 LAB — URINALYSIS, DIPSTICK ONLY
Bilirubin, UA: NEGATIVE
Glucose, UA: NEGATIVE
Ketones, UA: NEGATIVE
Leukocytes, UA: NEGATIVE
Nitrite, UA: NEGATIVE
Protein, UA: NEGATIVE
RBC, UA: NEGATIVE
Specific Gravity, UA: 1.018 (ref 1.005–1.030)
Urobilinogen, Ur: 0.2 mg/dL (ref 0.2–1.0)
pH, UA: 5.5 (ref 5.0–7.5)

## 2017-11-01 LAB — CBC
HEMOGLOBIN: 16 g/dL (ref 13.0–17.7)
Hematocrit: 46.5 % (ref 37.5–51.0)
MCH: 29.7 pg (ref 26.6–33.0)
MCHC: 34.4 g/dL (ref 31.5–35.7)
MCV: 86 fL (ref 79–97)
PLATELETS: 319 10*3/uL (ref 150–450)
RBC: 5.38 x10E6/uL (ref 4.14–5.80)
RDW: 13.5 % (ref 12.3–15.4)
WBC: 7.2 10*3/uL (ref 3.4–10.8)

## 2017-11-01 LAB — MICROALBUMIN / CREATININE URINE RATIO
Creatinine, Urine: 115.7 mg/dL
Microalb/Creat Ratio: 3.5 mg/g{creat} (ref 0.0–30.0)
Microalbumin, Urine: 4.1 ug/mL

## 2017-11-01 LAB — HEMOGLOBIN A1C
Est. average glucose Bld gHb Est-mCnc: 111 mg/dL
Hgb A1c MFr Bld: 5.5 % (ref 4.8–5.6)

## 2017-11-01 LAB — LIPID PANEL
Chol/HDL Ratio: 5.6 ratio — ABNORMAL HIGH (ref 0.0–5.0)
Cholesterol, Total: 214 mg/dL — ABNORMAL HIGH (ref 100–199)
HDL: 38 mg/dL — AB (ref >39)
LDL Calculated: 143 mg/dL — ABNORMAL HIGH (ref 0–99)
TRIGLYCERIDES: 165 mg/dL — AB (ref 0–149)
VLDL CHOLESTEROL CAL: 33 mg/dL (ref 5–40)

## 2017-11-01 LAB — PSA: PROSTATE SPECIFIC AG, SERUM: 1.3 ng/mL (ref 0.0–4.0)

## 2017-11-02 LAB — GC/CHLAMYDIA PROBE AMP
CHLAMYDIA, DNA PROBE: NEGATIVE
NEISSERIA GONORRHOEAE BY PCR: NEGATIVE

## 2018-03-14 ENCOUNTER — Ambulatory Visit: Payer: Federal, State, Local not specified - PPO | Admitting: Physician Assistant

## 2018-03-14 ENCOUNTER — Other Ambulatory Visit: Payer: Self-pay

## 2018-03-14 ENCOUNTER — Encounter: Payer: Self-pay | Admitting: Physician Assistant

## 2018-03-14 VITALS — BP 127/83 | HR 94 | Temp 98.6°F | Resp 20 | Ht 66.34 in | Wt 220.4 lb

## 2018-03-14 DIAGNOSIS — J069 Acute upper respiratory infection, unspecified: Secondary | ICD-10-CM | POA: Diagnosis not present

## 2018-03-14 DIAGNOSIS — R6889 Other general symptoms and signs: Secondary | ICD-10-CM

## 2018-03-14 LAB — POC INFLUENZA A&B (BINAX/QUICKVUE)
INFLUENZA B, POC: NEGATIVE
Influenza A, POC: NEGATIVE

## 2018-03-14 MED ORDER — HYDROCODONE-HOMATROPINE 5-1.5 MG/5ML PO SYRP: 5.0000 mL | ORAL_SOLUTION | Freq: Three times a day (TID) | ORAL | 0 refills | Status: DC | PRN

## 2018-03-14 MED ORDER — AZELASTINE HCL 0.1 % NA SOLN: 2.0000 | Freq: Two times a day (BID) | NASAL | 0 refills | Status: DC

## 2018-03-14 MED ORDER — BENZONATATE 100 MG PO CAPS: 100.0000 mg | ORAL_CAPSULE | Freq: Three times a day (TID) | ORAL | 0 refills | Status: DC | PRN

## 2018-03-14 NOTE — Progress Notes (Signed)
MRN: [id number] DOB: 1966/03/07  Subjective:   Malikai Gut is a 52 y.o. male presenting for chief complaint of Ear Problem (X 3 days- both ears); Nasal Congestion (X 3 days- chest congestion); and Sinus Problem .  Reports 3 day history of sudden onset runny nose, ear fullness, sinus pressure, and dry cough. Has had subjective low grade fever. Has tried mucinex and ibuprofen with relief. Denies  ear pain, sore throat, wheezing, shortness of breath, chest tightness, chest pain and myalgia, nausea, vomiting, abdominal pain and diarrhea. Has not had sick contact with anyone. No history of seasonal allergies, no history of asthma. Patient has not had flu shot this season. Denies smoking. Denies any other aggravating or relieving factors, no other questions or concerns.  Chawn has a current medication list which includes the following prescription(s): aspirin, bentyl, cyclobenzaprine, dextromethorphan-guaifenesin, diphenhydramine hcl, ibuprofen, losartan, mupirocin ointment, omega-3 acid ethyl esters, pseudoephedrine, travoprost (benzalkonium), azelastine, benzonatate, and hydrocodone-homatropine. Also is allergic to biaxin [clarithromycin].  Alontae  has a past medical history of Back pain, Glaucoma, Hypertension, and MRSA carrier. 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   Objective:   Vitals: BP 127/83   Pulse 94   Temp 98.6 F (37 C) (Oral)   Resp 20   Ht 5' 6.34" (1.685 m)   Wt 220 lb 6.4 oz (100 kg)   SpO2 97%   BMI 35.21 kg/m   Physical Exam  Constitutional: He is oriented to person, place, and time. He appears well-developed and well-nourished. No distress.  HENT:  Head: Normocephalic and atraumatic.  Right Ear: Tympanic membrane, external ear and ear canal normal.  Left Ear: Tympanic membrane, external ear and ear canal normal.  Nose: Mucosal edema and rhinorrhea present. Right sinus exhibits no maxillary sinus  tenderness and no frontal sinus tenderness. Left sinus exhibits no maxillary sinus tenderness and no frontal sinus tenderness.  Mouth/Throat: Uvula is midline and mucous membranes are normal. No posterior oropharyngeal edema, posterior oropharyngeal erythema or tonsillar abscesses. No tonsillar exudate.  Eyes: Conjunctivae are normal.  Neck: Normal range of motion.  Cardiovascular: Normal rate, regular rhythm, normal heart sounds and intact distal pulses.  Pulmonary/Chest: Effort normal and breath sounds normal. He has no decreased breath sounds. He has no wheezes. He has no rhonchi. He has no rales.  Lymphadenopathy:       Head (right side): No submental, no submandibular, no tonsillar, no preauricular, no posterior auricular and no occipital adenopathy present.       Head (left side): No submental, no submandibular, no tonsillar, no preauricular, no posterior auricular and no occipital adenopathy present.    He has no cervical adenopathy.       Right: No supraclavicular adenopathy present.       Left: No supraclavicular adenopathy present.  Neurological: He is alert and oriented to person, place, and time.  Skin: Skin is warm and dry.  Pt is warm to the touch.   Psychiatric: He has a normal mood and affect.  Vitals reviewed.   Results for orders placed or performed in visit on 03/14/18 (from the past 24 hour(s))  POC Influenza A&B(BINAX/QUICKVUE)     Status: Normal   Collection Time: 03/14/18 12:54 PM  Result Value Ref Range   Influenza A, POC Negative Negative   Influenza B, POC Negative Negative    Assessment and Plan :  1. Acute upper respiratory infection - Likely viral in etiology  d/t reassuring physical exam findings and labs. - Advised supportive care, offered symptomatic relief. - Return to clinic if symptoms worsen or fail to improve in 5-7 days, otherwise return to clinic as needed. - benzonatate (TESSALON) 100 MG capsule; Take 1-2 capsules (100-200 mg total) by mouth 3  (three) times daily as needed for cough.  Dispense: 40 capsule; Refill: 0 - azelastine (ASTELIN) 0.1 % nasal spray; Place 2 sprays into both nostrils 2 (two) times daily. Use in each nostril as directed  Dispense: 30 mL; Refill: 0 - HYDROcodone-homatropine (HYCODAN) 5-1.5 MG/5ML syrup; Take 5 mLs by mouth every 8 (eight) hours as needed for cough.  Dispense: 120 mL; Refill: 0  2. Flu-like symptoms - POC Influenza A&B(BINAX/QUICKVUE)  Side effects, risks, benefits, and alternatives of the medications and treatment plan prescribed today were discussed, and patient expressed understanding of the instructions given. No barriers to understanding were identified. Red flags discussed in detail. Pt expressed understanding regarding what to do in case of emergency/urgent symptoms.   Tenna Delaine, PA-C  [facility] 03/14/2018 2:33 PM

## 2018-03-14 NOTE — Patient Instructions (Signed)
-   We will treat this as a respiratory viral infection.  - I recommend you rest, drink plenty of fluids, eat light meals including soups.  - Use astelin nasal spray for congestion and runny nose.  - You may use cough syrup at night for your cough and sore throat, Tessalon pearls during the day. Be aware that cough syrup can definitely make you drowsy and sleepy so do not drive or operate any heavy machinery if it is affecting you during the day.  -Tea recipe for sore throat: boil water, add 2 inches shaved ginger root, steep 15 minutes, add juice from 2 full lemons, and 2 tbsp honey. - Please let me know if you are not seeing any improvement or get worse in 5-7 days.     Upper Respiratory Infection, Adult Most upper respiratory infections (URIs) are caused by a virus. A URI affects the nose, throat, and upper air passages. The most common type of URI is often called "the common cold." Follow these instructions at home:  Take medicines only as told by your doctor.  Gargle warm saltwater or take cough drops to comfort your throat as told by your doctor.  Use a warm mist humidifier or inhale steam from a shower to increase air moisture. This may make it easier to breathe.  Drink enough fluid to keep your pee (urine) clear or pale yellow.  Eat soups and other clear broths.  Have a healthy diet.  Rest as needed.  Go back to work when your fever is gone or your doctor says it is okay. ? You may need to stay home longer to avoid giving your URI to others. ? You can also wear a face mask and wash your hands often to prevent spread of the virus.  Use your inhaler more if you have asthma.  Do not use any tobacco products, including cigarettes, chewing tobacco, or electronic cigarettes. If you need help quitting, ask your doctor. Contact a doctor if:  You are getting worse, not better.  Your symptoms are not helped by medicine.  You have chills.  You are getting more short of  breath.  You have brown or red mucus.  You have yellow or brown discharge from your nose.  You have pain in your face, especially when you bend forward.  You have a fever.  You have puffy (swollen) neck glands.  You have pain while swallowing.  You have white areas in the back of your throat. Get help right away if:  You have very bad or constant: ? Headache. ? Ear pain. ? Pain in your forehead, behind your eyes, and over your cheekbones (sinus pain). ? Chest pain.  You have long-lasting (chronic) lung disease and any of the following: ? Wheezing. ? Long-lasting cough. ? Coughing up blood. ? A change in your usual mucus.  You have a stiff neck.  You have changes in your: ? Vision. ? Hearing. ? Thinking. ? Mood. This information is not intended to replace advice given to you by your health care provider. Make sure you discuss any questions you have with your health care provider. Document Released: 11/01/2007 Document Revised: 01/16/2016 Document Reviewed: 08/20/2013 Elsevier Interactive Patient Education  2018 [employer].

## 2018-03-18 ENCOUNTER — Telehealth: Payer: Self-pay | Admitting: Physician Assistant

## 2018-03-18 NOTE — Telephone Encounter (Signed)
Copied from [place] [phone]. Topic: General - Other >> Mar 18, 2018 12:20 PM Leward Quan A wrote: Reason for CRM: Patient called to request an Rx for a inhaler sent to his Pharmacy. [id number] [place], [place] [phone] (Phone) [phone] (Fax) States that he is wheezing and still coughing. Ph# [phone]

## 2018-03-19 MED ORDER — ALBUTEROL SULFATE HFA 108 (90 BASE) MCG/ACT IN AERS: 2.0000 | INHALATION_SPRAY | RESPIRATORY_TRACT | 1 refills | Status: DC | PRN

## 2018-03-19 NOTE — Addendum Note (Signed)
Addended by: Tenna Delaine D on: 03/19/2018 06:07 PM   Modules accepted: Orders

## 2018-03-19 NOTE — Telephone Encounter (Signed)
LMOVM with Brittany's message Albuterol sent to [facility] with any further symptoms

## 2018-03-19 NOTE — Telephone Encounter (Signed)
Please call pt and let him know I have sent in his albuterol inhaler. If any of his sx worsen, please have him return to office for further evaluation.

## 2018-04-05 ENCOUNTER — Other Ambulatory Visit: Payer: Self-pay | Admitting: Physician Assistant

## 2018-04-05 DIAGNOSIS — J069 Acute upper respiratory infection, unspecified: Secondary | ICD-10-CM

## 2018-04-05 NOTE — Telephone Encounter (Signed)
Requested Prescriptions  Pending Prescriptions Disp Refills  . azelastine (ASTELIN) 0.1 % nasal spray [Pharmacy Med Name: AZELASTINE 0.1% (137 MCG) SPRY]  0    Sig: PLACE 2 SPRAYS INTO BOTH NOSTRILS 2 (TWO) TIMES DAILY. USE IN EACH NOSTRIL AS DIRECTED     Ear, Nose, and Throat: Nasal Preparations - Antiallergy Passed - 04/05/2018  2:41 AM      Passed - Valid encounter within last 12 months    Recent Outpatient Visits          3 weeks ago Acute upper respiratory infection   [facility], [place], PA-C   5 months ago Annual physical exam   [facility] at [place], PA-C   6 months ago Facial abscess   [facility] at [place], PA-C   8 months ago Abscess of face   [facility] at [place], PA-C   11 months ago History of MRSA infection   Primary Care at [facility], [place], [place]

## 2018-04-22 ENCOUNTER — Telehealth: Payer: Self-pay | Admitting: Gastroenterology

## 2018-04-22 NOTE — Telephone Encounter (Signed)
See note below regarding colon

## 2018-04-22 NOTE — Telephone Encounter (Signed)
Patient states he is 69 and wants to schedule a colonoscopy in January. Patient states his last colon was probably 5 years ago in [place]. Pt last saw Dr.Danis 12.2018. Please advise on scheduling procedure.

## 2018-04-22 NOTE — Telephone Encounter (Signed)
He can be directly booked with [facility] nurse to schedule screening colonoscopy.

## 2018-04-22 NOTE — Telephone Encounter (Signed)
Dr. Loletha Carrow this pt is calling to schedule colon. Per your last OV note pt was due for colon in June. Does he need an OV or can he be a direct colon. Please advise.

## 2018-05-06 ENCOUNTER — Other Ambulatory Visit: Payer: Self-pay

## 2018-05-06 ENCOUNTER — Encounter: Payer: Self-pay | Admitting: Family Medicine

## 2018-05-06 ENCOUNTER — Ambulatory Visit: Payer: Federal, State, Local not specified - PPO | Admitting: Family Medicine

## 2018-05-06 VITALS — BP 148/98 | HR 75 | Temp 98.5°F | Ht 65.0 in | Wt 222.8 lb

## 2018-05-06 DIAGNOSIS — K582 Mixed irritable bowel syndrome: Secondary | ICD-10-CM

## 2018-05-06 DIAGNOSIS — B3742 Candidal balanitis: Secondary | ICD-10-CM | POA: Diagnosis not present

## 2018-05-06 MED ORDER — CLOTRIMAZOLE-BETAMETHASONE 1-0.05 % EX CREA: 1.0000 "application " | TOPICAL_CREAM | Freq: Two times a day (BID) | CUTANEOUS | 0 refills | Status: DC

## 2018-05-06 MED ORDER — BENTYL 20 MG PO TABS: 20.0000 mg | ORAL_TABLET | Freq: Two times a day (BID) | ORAL | 0 refills | Status: DC

## 2018-05-06 NOTE — Patient Instructions (Addendum)
If you have lab work done today you will be contacted with your lab results within the next 2 weeks.  If you have not heard from [place] then please contact us. The fastest way to get your results is to register for My Chart.   IF you received an x-ray today, you will receive an invoice from [facility]. Please contact [facility] at [phone] with questions or concerns regarding your invoice.   IF you received labwork today, you will receive an invoice from [facility]. Please contact [facility] at [phone] with questions or concerns regarding your invoice.   Our billing staff will not be able to assist you with questions regarding bills from these companies.  You will be contacted with the lab results as soon as they are available. The fastest way to get your results is to activate your My Chart account. Instructions are located on the last page of this paperwork. If you have not heard from [place] regarding the results in 2 weeks, please contact this office.      Balanitis Balanitis is swelling and irritation (inflammation) of the head of the penis (glans penis). The condition may also cause inflammation of the skin around the glans penis (foreskin) in men who have not been circumcised. It may develop because of an infection or another medical condition. Balanitis occurs most often among men who have not had their foreskin removed (uncircumcised men). Balanitis sometimes causes scarring of the penis or foreskin, which can require surgery. Untreated balanitis can increase the risk of penile cancer. What are the causes? Common causes of this condition include:  Poor personal hygiene, especially in uncircumcised men. Not cleaning the glans penis and foreskin well can result in buildup of bacteria, viruses, and yeast, which can lead to infection and inflammation.  Irritation and lack of air flow due to fluid (smegma) that can build up on the glans penis.  Other causes  include:  Chemical irritation from products such as soaps or shower gels (especially those that have fragrance), condoms, personal lubricants, petroleum jelly, spermicides, or fabric softeners.  Skin conditions, such as eczema, dermatitis, and psoriasis.  Allergies to medicines, such as tetracycline and sulfa drugs.  Certain medical conditions, including liver cirrhosis, congestive heart failure, diabetes, and kidney disease.  Infections, such as candidiasis, HPV (human papillomavirus), herpes simplex, gonorrhea, and syphilis.  Severe obesity.  What increases the risk? The following factors may make you more likely to develop this condition:  Having diabetes. This is the most common risk factor.  Having a tight foreskin that is difficult to pull back (retract) past the glans.  Having sexual intercourse without using a condom.  What are the signs or symptoms? Symptoms of this condition include:  Discharge from under the foreskin.  A bad smell.  Pain or difficulty retracting the foreskin.  Tenderness, redness, and swelling of the glans.  A rash or sores on the glans or foreskin.  Itchiness.  Inability to get an erection due to pain.  Difficulty urinating.  Scarring of the penis or foreskin, in some cases.  How is this diagnosed? This condition may be diagnosed based on:  A physical exam.  Testing a swab of discharge to check for bacterial or fungal infection.  Blood tests: ? To check for viruses that can cause balanitis. ? To check your blood sugar (glucose) level. High blood glucose could be a sign of diabetes, which can cause balanitis.  How is this treated? Treatment for balanitis depends on  the cause. Treatment may include:  Improving personal hygiene. Your health care provider may recommend sitting in a bath of warm water that is deep enough to cover your hips and buttocks (sitz bath).  Medicines such as: ? Creams or ointments to reduce swelling  (steroids) or to treat an infection. ? Antibiotic medicine. ? Antifungal medicine.  Surgery to remove or cut the foreskin (circumcision). This may be done if you have scarring on the foreskin that makes it difficult to retract.  Controlling other medical problems that may be causing your condition or making it worse.  Follow these instructions at home:  Do not have sex until the condition clears up, or until your health care provider approves.  Keep your penis clean and dry. Take sitz baths as recommended by your health care provider.  Avoid products that irritate your skin or make symptoms worse, such as soaps and shower gels that have fragrance.  Take over-the-counter and prescription medicines only as told by your health care provider. ? If you were prescribed an antibiotic medicine or a cream or ointment, use it as told by your health care provider. Do not stop using your medicine, cream, or ointment even if you start to feel better. ? Do not drive or use heavy machinery while taking prescription pain medicine. Contact a health care provider if:  Your symptoms get worse or do not improve with home care.  You develop chills or a fever.  You have trouble urinating.  You cannot retract your foreskin. Get help right away if:  You develop severe pain.  You are unable to urinate. Summary  Balanitis is inflammation of the head of the penis (glans penis) caused by irritation or infection.  Balanitis causes pain, redness, and swelling of the glans penis.  This condition is most common among uncircumcised men who do not keep their glans penis clean and in men who have diabetes.  Treatment may include creams or ointments.  Good hygiene is important for prevention. This includes pulling back the foreskin when washing your penis. This information is not intended to replace advice given to you by your health care provider. Make sure you discuss any questions you have with your  health care provider. Document Released: 10/01/2008 Document Revised: 04/03/2016 Document Reviewed: 04/03/2016 Elsevier Interactive Patient Education  2017 [employer].

## 2018-05-07 NOTE — Progress Notes (Signed)
Established Patient Office Visit  Subjective:  Patient ID: Christopher [name], male    DOB: 17-Jan-1966  Age: 52 y.o. MRN: [id number]  CC:  Chief Complaint  Patient presents with  . Rash    on private area, bright red, and jock ich (1 month )    HPI Christopher [name] presents for rash on his the penis that can be itchy He has been using triamcinolone He has a ring of small bumps around the head of the penis   He also has a history of IBS  And takes bentyl and would like a refill  It has been worse lately IBS with diarrhea is his diagnosis He states that the bentyl helps His diarrhea is nonbloody but can be up to 4 times a day    Past Medical History:  Diagnosis Date  . Back pain   . Glaucoma   . Hypertension   . MRSA carrier     Past Surgical History:  Procedure Laterality Date  . 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 CERVICAL FUSION      Family History  Problem Relation Age of Onset  . Hyperlipidemia Father   . Cancer Maternal Grandfather   . Diabetes Paternal Grandmother   . Heart disease Paternal Grandmother     Social History   Socioeconomic History  . Marital status: Married    Spouse name: Not on file  . Number of children: 4  . Years of education: Not on file  . Highest education level: Not on file  Occupational History  . Occupation: Engineer, production  Social Needs  . Financial resource strain: Not on file  . Food insecurity:    Worry: Not on file    Inability: Not on file  . Transportation needs:    Medical: Not on file    Non-medical: Not on file  Tobacco Use  . Smoking status: Never Smoker  . Smokeless tobacco: Never Used  Substance and Sexual Activity  . Alcohol use: No    Alcohol/week: 0.0 standard drinks  . Drug use: No  . Sexual activity: Yes  Lifestyle  . Physical activity:    Days per week: Not on file    Minutes per session: Not on file  . Stress: Not on file  Relationships  . Social connections:    Talks on phone: Not on  file    Gets together: Not on file    Attends religious service: Not on file    Active member of club or organization: Not on file    Attends meetings of clubs or organizations: Not on file    Relationship status: Not on file  . Intimate partner violence:    Fear of current or ex partner: Not on file    Emotionally abused: Not on file    Physically abused: Not on file    Forced sexual activity: Not on file  Other Topics Concern  . Not on file  Social History Narrative  . Not on file    Outpatient Medications Prior to Visit  Medication Sig Dispense Refill  . aspirin 81 MG tablet Take 81 mg by mouth daily.    . cyclobenzaprine (FLEXERIL) 5 MG tablet Take 1 tablet (5 mg total) by mouth 3 (three) times daily. (Patient taking differently: Take 5 mg by mouth as needed. ) 270 tablet 1  . ibuprofen (ADVIL,MOTRIN) 800 MG tablet Take 1 tablet (800 mg total) by mouth every 8 (eight) hours as needed.  180 tablet 0  . losartan (COZAAR) 50 MG tablet Take 1 tablet (50 mg total) by mouth daily. 90 tablet 3  . mupirocin ointment (BACTROBAN) 2 % Place 1 application into the nose 2 (two) times daily. 22 g 1  . omega-3 acid ethyl esters (LOVAZA) 1 g capsule Take 1 capsule (1 g total) by mouth 2 (two) times daily. 180 capsule 3  . travoprost, benzalkonium, (TRAVATAN) 0.004 % ophthalmic solution 1 drop at bedtime.    Marland Kitchen albuterol (PROVENTIL HFA;VENTOLIN HFA) 108 (90 Base) MCG/ACT inhaler Inhale 2 puffs into the lungs every 4 (four) hours as needed for wheezing or shortness of breath (cough, shortness of breath or wheezing.). 1 Inhaler 1  . azelastine (ASTELIN) 0.1 % nasal spray PLACE 2 SPRAYS INTO BOTH NOSTRILS 2 (TWO) TIMES DAILY. USE IN EACH NOSTRIL AS DIRECTED 30 mL 0  . BENTYL 20 MG tablet Take 1 tablet (20 mg total) by mouth 2 (two) times daily. 180 tablet 1  . benzonatate (TESSALON) 100 MG capsule Take 1-2 capsules (100-200 mg total) by mouth 3 (three) times daily as needed for cough. 40 capsule 0  .  dextromethorphan-guaiFENesin (MUCINEX DM) 30-600 MG 12hr tablet Take 1 tablet by mouth 2 (two) times daily.    . diphenhydrAMINE HCl (BENADRYL ALLERGY PO) Take by mouth.    Marland Kitchen HYDROcodone-homatropine (HYCODAN) 5-1.5 MG/5ML syrup Take 5 mLs by mouth every 8 (eight) hours as needed for cough. 120 mL 0  . pseudoephedrine (SUDAFED) 30 MG tablet Take 60 mg by mouth every 8 (eight) hours as needed for congestion.     No facility-administered medications prior to visit.     Allergies  Allergen Reactions  . Biaxin [Clarithromycin] Shortness Of Breath    ROS Review of Systems Review of Systems  Constitutional: Negative for activity change, appetite change, chills and fever.  HENT: Negative for congestion, nosebleeds, trouble swallowing and voice change.   Respiratory: Negative for cough, shortness of breath and wheezing.   Gastrointestinal: see hpi Genitourinary: Negative for difficulty urinating, dysuria, flank pain and hematuria.  Musculoskeletal: Negative for back pain, joint swelling and neck pain.  Neurological: Negative for dizziness, speech difficulty, light-headedness and numbness.  See HPI. All other review of systems negative.     Objective:    Physical Exam  BP (!) 148/98 (BP Location: Left Arm, Patient Position: Sitting, Cuff Size: Normal)   Pulse 75   Temp 98.5 F (36.9 C) (Oral)   Ht 5\' 5"  (1.651 m)   Wt 222 lb 12.8 oz (101.1 kg)   SpO2 96%   BMI 37.08 kg/m  Wt Readings from Last 3 Encounters:  05/06/18 222 lb 12.8 oz (101.1 kg)  03/14/18 220 lb 6.4 oz (100 kg)  10/31/17 218 lb 9.6 oz (99.2 kg)   Physical Exam  Constitutional: Oriented to person, place, and time. Appears well-developed and well-nourished.  HENT:  Head: Normocephalic and atraumatic.  Eyes: Conjunctivae and EOM are normal.  Pulmonary/Chest: Effort normal  Neurological: Is alert and oriented to person, place, and time.  Skin: Skin is warm. Capillary refill takes less than 2 seconds.  Psychiatric:  Has a normal mood and affect. Behavior is normal. Judgment and thought content normal.   Chaperone present Papular lesion on the glans  The penis also notes to have a friction blister No redness, no excoriation  Health Maintenance Due  Topic Date Due  . HIV Screening  10/30/1982  . COLONOSCOPY  10/29/2017    There are no preventive care reminders  to display for this patient.  No results found for: TSH Lab Results  Component Value Date   WBC 7.2 10/31/2017   HGB 16.0 10/31/2017   HCT 46.5 10/31/2017   MCV 86 10/31/2017   PLT 319 10/31/2017   Lab Results  Component Value Date   NA 140 10/31/2017   K 4.3 10/31/2017   CO2 22 10/31/2017   GLUCOSE 95 10/31/2017   BUN 12 10/31/2017   CREATININE 1.01 10/31/2017   BILITOT 0.2 05/12/2016   ALKPHOS 91 05/12/2016   AST 40 05/12/2016   ALT 92 (H) 05/12/2016   PROT 6.9 05/12/2016   ALBUMIN 4.8 09/10/2017   CALCIUM 9.9 10/31/2017   Lab Results  Component Value Date   CHOL 214 (H) 10/31/2017   Lab Results  Component Value Date   HDL 38 (L) 10/31/2017   Lab Results  Component Value Date   LDLCALC 143 (H) 10/31/2017   Lab Results  Component Value Date   TRIG 165 (H) 10/31/2017   Lab Results  Component Value Date   CHOLHDL 5.6 (H) 10/31/2017   Lab Results  Component Value Date   HGBA1C 5.5 10/31/2017      Assessment & Plan:   Problem List Items Addressed This Visit      Digestive   IBS (irritable bowel syndrome) - refilled Bentyl   Relevant Medications   BENTYL 20 MG tablet    Other Visit Diagnoses    Candidal balanitis    -  Primary  Advised to stop triamcinolone which can thin skin    Relevant Medications   clotrimazole-betamethasone (LOTRISONE) cream      Meds ordered this encounter  Medications  . clotrimazole-betamethasone (LOTRISONE) cream    Sig: Apply 1 application topically 2 (two) times daily.    Dispense:  30 g    Refill:  0  . BENTYL 20 MG tablet    Sig: Take 1 tablet (20 mg total)  by mouth 2 (two) times daily.    Dispense:  180 tablet    Refill:  0    Follow-up: Return if symptoms worsen or fail to improve.    Forrest Moron, MD

## 2018-05-07 NOTE — Telephone Encounter (Signed)
Left voicemail for patient to call back and schedule colon and pv.

## 2018-05-23 ENCOUNTER — Encounter: Payer: Self-pay | Admitting: Gastroenterology

## 2018-05-30 ENCOUNTER — Encounter: Payer: Self-pay | Admitting: Gastroenterology

## 2018-05-30 ENCOUNTER — Ambulatory Visit: Payer: Federal, State, Local not specified - PPO | Admitting: Gastroenterology

## 2018-05-30 VITALS — BP 130/90 | HR 84 | Ht 65.25 in | Wt 226.1 lb

## 2018-05-30 DIAGNOSIS — K580 Irritable bowel syndrome with diarrhea: Secondary | ICD-10-CM

## 2018-05-30 NOTE — Progress Notes (Signed)
     [facility] Progress Note  Chief Complaint: IBS D  Subjective  History:  I saw Christopher [name] for the first time in about a year.  He continues to have urgency and semi-formed stool usually after meals, up to 6-7 BMs per day.  It is affecting his quality of life, keeping him from going out to meals and making it difficult to work at times.  He is a highly functional individual, he has some stress that he feels he handles well.  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  ROS: Cardiovascular:  no chest pain Respiratory: no dyspnea Denies rectal bleeding.  No weight loss Upper abdominal bloating discomfort after meals he senses food moving through his abdomen.  The patient's Past Medical, Family and Social History were reviewed and are on file in the EMR.  Objective:  Med list reviewed  Current Outpatient Medications:  .  aspirin 81 MG tablet, Take 81 mg by mouth daily., Disp: , Rfl:  .  BENTYL 20 MG tablet, Take 1 tablet (20 mg total) by mouth 2 (two) times daily., Disp: 180 tablet, Rfl: 0 .  clotrimazole-betamethasone (LOTRISONE) cream, Apply 1 application topically 2 (two) times daily., Disp: 30 g, Rfl: 0 .  cyclobenzaprine (FLEXERIL) 5 MG tablet, Take 1 tablet (5 mg total) by mouth 3 (three) times daily. (Patient taking differently: Take 5 mg by mouth as needed. ), Disp: 270 tablet, Rfl: 1 .  ibuprofen (ADVIL,MOTRIN) 800 MG tablet, Take 1 tablet (800 mg total) by mouth every 8 (eight) hours as needed., Disp: 180 tablet, Rfl: 0 .  losartan (COZAAR) 50 MG tablet, Take 1 tablet (50 mg total) by mouth daily., Disp: 90 tablet, Rfl: 3 .  mupirocin ointment (BACTROBAN) 2 %, Place 1 application into the nose 2 (two) times daily. (Patient taking differently: Place 1 application into the nose as needed. ), Disp: 22 g, Rfl: 1 .  omega-3 acid ethyl esters (LOVAZA) 1 g capsule, Take 1 capsule (1 g total) by mouth 2 (two) times daily.,  Disp: 180 capsule, Rfl: 3 .  travoprost, benzalkonium, (TRAVATAN) 0.004 % ophthalmic solution, 1 drop at bedtime., Disp: , Rfl:    Vital signs in last 24 hrs: Vitals:   05/30/18 1028  BP: 130/90  Pulse: 84    Physical Exam  Well-appearing  HEENT: sclera anicteric, oral mucosa moist without lesions  Neck: supple, no thyromegaly, JVD or lymphadenopathy  Cardiac: RRR without murmurs, S1S2 heard, no peripheral edema  Pulm: clear to auscultation bilaterally, normal RR and effort noted  Abdomen: soft, no tenderness, with active bowel sounds. No guarding or palpable hepatosplenomegaly, limited by body habitus    @ASSESSMENTPLANBEGIN @ Assessment: Encounter Diagnosis  Name Primary?  . Irritable bowel syndrome with diarrhea Yes   Dicyclomine has not been very effective.  I gave him a 1 week supply of samples of Viberzi 75 mg once daily.  He knows to stop the medicine and call us if he does not have a bowel movement for 3 days or if he has nominal pain or any other concerns.  Discontinue dicyclomine  He has an upcoming screening colonoscopy scheduled.   Total time 15 minutes, over half spent face-to-face with patient in counseling and coordination of care.   Nelida Meuse III

## 2018-05-30 NOTE — Patient Instructions (Addendum)
We have given you samples of the following medication to take: Viberzi 75 mg. If you do not have a bowel movement for 3 days please stop this medication.

## 2018-06-11 ENCOUNTER — Ambulatory Visit (AMBULATORY_SURGERY_CENTER): Payer: Self-pay

## 2018-06-11 VITALS — Ht 65.0 in | Wt 222.2 lb

## 2018-06-11 DIAGNOSIS — Z1211 Encounter for screening for malignant neoplasm of colon: Secondary | ICD-10-CM

## 2018-06-11 MED ORDER — NA SULFATE-K SULFATE-MG SULF 17.5-3.13-1.6 GM/177ML PO SOLN: 1.0000 | Freq: Once | ORAL | 0 refills | Status: AC

## 2018-06-11 NOTE — Progress Notes (Signed)
Denies allergies to eggs or soy products. Denies complication of anesthesia or sedation. Denies use of weight loss medication. Denies use of O2.   Emmi instructions declined.  

## 2018-06-12 ENCOUNTER — Encounter: Payer: Self-pay | Admitting: Gastroenterology

## 2018-06-18 ENCOUNTER — Encounter: Payer: Self-pay | Admitting: Gastroenterology

## 2018-06-18 ENCOUNTER — Ambulatory Visit (AMBULATORY_SURGERY_CENTER): Payer: Federal, State, Local not specified - PPO | Admitting: Gastroenterology

## 2018-06-18 VITALS — BP 100/61 | HR 67 | Temp 98.6°F | Resp 12 | Ht 65.0 in | Wt 226.0 lb

## 2018-06-18 DIAGNOSIS — D122 Benign neoplasm of ascending colon: Secondary | ICD-10-CM

## 2018-06-18 DIAGNOSIS — D123 Benign neoplasm of transverse colon: Secondary | ICD-10-CM

## 2018-06-18 DIAGNOSIS — Z1211 Encounter for screening for malignant neoplasm of colon: Secondary | ICD-10-CM

## 2018-06-18 DIAGNOSIS — R197 Diarrhea, unspecified: Secondary | ICD-10-CM

## 2018-06-18 MED ORDER — SODIUM CHLORIDE 0.9 % IV SOLN: 500.0000 mL | Freq: Once | INTRAVENOUS | Status: DC

## 2018-06-18 NOTE — Progress Notes (Signed)
PT taken to PACU. Monitors in place. VSS. Report given to RN. 

## 2018-06-18 NOTE — Patient Instructions (Signed)
Please read handouts provided. Continue present medications. Await pathology results.     YOU HAD AN ENDOSCOPIC PROCEDURE TODAY AT THE [facility]:   Refer to the procedure report that was given to you for any specific questions about what was found during the examination.  If the procedure report does not answer your questions, please call your gastroenterologist to clarify.  If you requested that your care partner not be given the details of your procedure findings, then the procedure report has been included in a sealed envelope for you to review at your convenience later.  YOU SHOULD EXPECT: Some feelings of bloating in the abdomen. Passage of more gas than usual.  Walking can help get rid of the air that was put into your GI tract during the procedure and reduce the bloating. If you had a lower endoscopy (such as a colonoscopy or flexible sigmoidoscopy) you may notice spotting of blood in your stool or on the toilet paper. If you underwent a bowel prep for your procedure, you may not have a normal bowel movement for a few days.  Please Note:  You might notice some irritation and congestion in your nose or some drainage.  This is from the oxygen used during your procedure.  There is no need for concern and it should clear up in a day or so.  SYMPTOMS TO REPORT IMMEDIATELY:   Following lower endoscopy (colonoscopy or flexible sigmoidoscopy):  Excessive amounts of blood in the stool  Significant tenderness or worsening of abdominal pains  Swelling of the abdomen that is new, acute  Fever of 100F or higher    For urgent or emergent issues, a gastroenterologist can be reached at any hour by calling [phone].   DIET:  We do recommend a small meal at first, but then you may proceed to your regular diet.  Drink plenty of fluids but you should avoid alcoholic beverages for 24 hours.  ACTIVITY:  You should plan to take it easy for the rest of today and you should NOT DRIVE  or use heavy machinery until tomorrow (because of the sedation medicines used during the test).    FOLLOW UP: Our staff will call the number listed on your records the next business day following your procedure to check on you and address any questions or concerns that you may have regarding the information given to you following your procedure. If we do not reach you, we will leave a message.  However, if you are feeling well and you are not experiencing any problems, there is no need to return our call.  We will assume that you have returned to your regular daily activities without incident.  If any biopsies were taken you will be contacted by phone or by letter within the next 1-3 weeks.  Please call us at [phone] if you have not heard about the biopsies in 3 weeks.    SIGNATURES/CONFIDENTIALITY: You and/or your care partner have signed paperwork which will be entered into your electronic medical record.  These signatures attest to the fact that that the information above on your After Visit Summary has been reviewed and is understood.  Full responsibility of the confidentiality of this discharge information lies with you and/or your care-partner. 

## 2018-06-18 NOTE — Progress Notes (Signed)
Called to room to assist during endoscopic procedure.  Patient ID and intended procedure confirmed with present staff. Received instructions for my participation in the procedure from the performing physician.  

## 2018-06-18 NOTE — Op Note (Signed)
[facility] Patient Name: Sequoia Mincey Procedure Date: 06/18/2018 10:49 AM MRN: [id number] Endoscopist: Mallie Mussel L. Loletha Carrow , MD Age: 53 Referring MD:  Date of Birth: Jul 19, 1965 Gender: Male Account #: [id number] Procedure:                Colonoscopy Indications:              Screening for colorectal malignant neoplasm, This                            is the patient's first colonoscopy Medicines:                Monitored Anesthesia Care Procedure:                Pre-Anesthesia Assessment:                           - Prior to the procedure, a History and Physical                            was performed, and patient medications and                            allergies were reviewed. The patient's tolerance of                            previous anesthesia was also reviewed. The risks                            and benefits of the procedure and the sedation                            options and risks were discussed with the patient.                            All questions were answered, and informed consent                            was obtained. Prior Anticoagulants: The patient has                            taken no previous anticoagulant or antiplatelet                            agents. ASA Grade Assessment: II - A patient with                            mild systemic disease. After reviewing the risks                            and benefits, the patient was deemed in                            satisfactory condition to undergo the procedure.  After obtaining informed consent, the colonoscope                            was passed under direct vision. Throughout the                            procedure, the patient's blood pressure, pulse, and                            oxygen saturations were monitored continuously. The                            Colonoscope was introduced through the anus and                            advanced to the the  terminal ileum, with                            identification of the appendiceal orifice and IC                            valve. The colonoscopy was performed without                            difficulty. The patient tolerated the procedure                            well. The quality of the bowel preparation was                            excellent. The terminal ileum, ileocecal valve,                            appendiceal orifice, and rectum were photographed. Scope In: 11:18:29 AM Scope Out: 11:34:08 AM Scope Withdrawal Time: 0 hours 14 minutes 19 seconds  Total Procedure Duration: 0 hours 15 minutes 39 seconds  Findings:                 The perianal and digital rectal examinations were                            normal.                           The terminal ileum appeared normal.                           Two sessile polyps were found in the ascending                            colon. The polyps were diminutive in size. These                            polyps were removed with a cold biopsy forceps.  Resection and retrieval were complete. (Jar 1)                           Normal mucosa was found in the entire colon.                            Biopsies for histology were taken with a cold                            forceps from the right colon and left colon for                            evaluation of microscopic colitis (reported chronic                            diarrhea). (Jar 2)                           A 5 mm polyp was found in the transverse colon. The                            polyp was sessile. The polyp was removed with a                            cold snare. Resection and retrieval were complete.                            (Jar 3)                           A few diverticula were found in the left colon and                            right colon.                           The exam was otherwise without abnormality on                             direct and retroflexion views. Complications:            No immediate complications. Estimated Blood Loss:     Estimated blood loss was minimal. Impression:               - The examined portion of the ileum was normal.                           - Two diminutive polyps in the ascending colon,                            removed with a cold biopsy forceps. Resected and                            retrieved.                           -  Normal mucosa in the entire examined colon.                            Biopsied.                           - One 5 mm polyp in the transverse colon, removed                            with a cold snare. Resected and retrieved.                           - Diverticulosis in the left colon and in the right                            colon.                           - The examination was otherwise normal on direct                            and retroflexion views. Recommendation:           - Patient has a contact number available for                            emergencies. The signs and symptoms of potential                            delayed complications were discussed with the                            patient. Return to normal activities tomorrow.                            Written discharge instructions were provided to the                            patient.                           - Resume previous diet.                           - Continue present medications.                           - Await pathology results.                           - Repeat colonoscopy is recommended for                            surveillance. The colonoscopy date will be                            determined after pathology results  from today's                            exam become available for review. Aime Carreras L. Loletha Carrow, MD 06/18/2018 11:44:16 AM This report has been signed electronically.

## 2018-06-19 ENCOUNTER — Telehealth: Payer: Self-pay

## 2018-06-19 NOTE — Telephone Encounter (Signed)
  Follow up Call-  Call back number 06/18/2018  Post procedure Call Back phone  # [phone]  Permission to leave phone message Yes  Some recent data might be hidden     Patient questions:  Do you have a fever, pain , or abdominal swelling? No. Pain Score  0 *  Have you tolerated food without any problems? Yes.    Have you been able to return to your normal activities? Yes.    Do you have any questions about your discharge instructions: Diet   No. Medications  No. Follow up visit  No.  Do you have questions or concerns about your Care? No.  Actions: * If pain score is 4 or above: No action needed, pain <4.

## 2018-06-24 ENCOUNTER — Encounter: Payer: Self-pay | Admitting: Gastroenterology

## 2018-07-22 ENCOUNTER — Encounter: Payer: Self-pay | Admitting: Neurology

## 2018-07-22 ENCOUNTER — Ambulatory Visit: Payer: Federal, State, Local not specified - PPO | Admitting: Neurology

## 2018-07-22 ENCOUNTER — Telehealth: Payer: Self-pay | Admitting: Neurology

## 2018-07-22 VITALS — BP 148/104 | HR 79 | Ht 65.0 in | Wt 220.0 lb

## 2018-07-22 DIAGNOSIS — Z9989 Dependence on other enabling machines and devices: Secondary | ICD-10-CM

## 2018-07-22 DIAGNOSIS — G4733 Obstructive sleep apnea (adult) (pediatric): Secondary | ICD-10-CM | POA: Diagnosis not present

## 2018-07-22 DIAGNOSIS — Z029 Encounter for administrative examinations, unspecified: Principal | ICD-10-CM

## 2018-07-22 DIAGNOSIS — Z0289 Encounter for other administrative examinations: Secondary | ICD-10-CM

## 2018-07-22 NOTE — Patient Instructions (Signed)
[id number] [name], MD [address] , [place], [place] Voice: [phone]   To whom it may concern,   [name] is an established patient of our practice, carries a diagnosis of obstructive sleep apnea and has been highly compliant with CPAP therapy.   A download over the last 30 days showed 100% compliance with a residual AHI of 1.7, a download over 365 days showed 98% compliance with a residual AHI of 1.4 events per hour.   CPAP is set at 14 cmH2O the patient has benefited greatly from CPAP therapy and has continued to adhere to the guidelines his Epworth Sleepiness Scale is endorsed at 0 point fatigue severity scale at 9 point, both in the lowest range possible.  The patient should not be restricted from operating machinery or performing his job duties.  Larey Seat, MD    07-22-2018  Medical Director at [facility].

## 2018-07-22 NOTE — Telephone Encounter (Signed)
[id number] [name], MD [address] , [place], [place] Voice: [phone]   To whom it may concern,   [name] is an established patient of our practice, carries a diagnosis of obstructive sleep apnea and has been highly compliant with CPAP therapy.   A download over the last 30 days showed 100% compliance with a residual AHI of 1.7, a download over 365 days showed 98% compliance with a residual AHI of 1.4 events per hour.   CPAP is set at 14 cmH2O the patient has benefited greatly from CPAP therapy and has continued to adhere to the guidelines his Epworth Sleepiness Scale is endorsed at 0 point fatigue severity scale at 9 point, both in the lowest range possible.  The patient should not be restricted from operating machinery or performing his job duties.  Larey Seat, MD    07-22-2018  Medical Director at [facility].

## 2018-07-22 NOTE — Progress Notes (Addendum)
SLEEP MEDICINE CLINIC   Provider:  Larey Seat, MD   Referring Provider: Mancel Bale, PA-C Primary Care Physician:  Horald Pollen, MD  Chief Complaint  Patient presents with  . Follow-up    pt alone, rm 11. pt states his machine is beginning to make noise. the noise seems to be louder when the machine is under full pressure. DME [facility]. his machine was set up dec 2015.     HPI:  Jcion Buddenhagen is a 53 y.o. male , seen here in a RV,     PA. 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   I have the pleasure of looking back on 30 and 3 at 65 days of CPAP use the patient has been 100% compliant his residual AHI varies between 1.4 and 1.7/h CPAP is set at 14 cmH2O was 1 cm EPR in his average user time for the last 365 days has been 8 hours and 22 minutes.  He endorsed the Epworth Sleepiness Scale at 0 points, fatigue severity at 9 points both the lowest possible score.  He has noticed that there seems to be an internal air leak a little whistling sound that the machine admits when he exhales.  I have 2 options to bridge him over until he is due for new machine 1 is to increase the expiratory pressure release in order to ease the expiratory pressure on the machine as well and my second option would be to declare the machine nonfunctional and in that case the patient will get a new machine anyway., following up on CPAP compliance.  The patient has used the machine 30 out of 30 days with 100% compliance 4 hours.  Average use is 8 hours and 24 minutes, CPAP is set at 14 cmH2O was 1 cm EPR, residual AHI is 1.1.  There are minor air leaks noted.  This machine is an air sense 10 CPAP that can also be used as and autotitrator and should be able to give data about obstructive or central apneas when necessary. The patient reports  that he can hardly sleep without his CPAP, he is very much used to using one.  He endorsed an Epworth score at 0 points fatigue severity at 9 points.  Also endorsed at the lowest possible level.  The needs to be no adjustment made, I will follow the patient yearly.  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    Dr. 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 The patient provided me with several copies of previous sleep studies. The patient also owns 2 machines- one of the normal longer downloadable through our software. [name]. presented with a sleep study from [place] [facility]. At the time in 6533 he was 53 year old gentleman referred for possible sleep disordered breathing he reached an AHI of 88 with an oxygen nadir of 64%, had loud snoring, no PLMS were noted. He was titrated to 15 cm water pressure he used a nasal interface. This is a titration his AHI was reduced to 4.4 an improvement of over 90%. BiPAP was not attempted. On 15 cm the AHI was 0.8.  He then underwent  a new titration on 11/12/2013 in [place], [place]. He was re-titrated to CPAP this time he had frequent periodic limb movements for an index of 20.4 but very few arousals. He was titrated to 14 cm water pressure but his AHI was 3.5. I would venture to say that he was under titrated.  The patient received a new  CPAP which he has used preferable since. He has used a new machine 329 out of 365 days which is at 90% compliance he has 100% compliance for the last 30 days his AHI is 1.7 on 14 cm water was 1 cm EPR. Average user time is 8 hours and 5 minutes. Serial number is [id number]. The patient endorsed today the Epworth Sleepiness Scale at 0 points. The patient is using CPAP now for almost a decade and is highly compliant.  Sleep habits are as follows:  He goes to bed at 9:30 PM and usually falls asleep promptly. The bedroom is cool quiet and dark, he prefers to sleep on his side on one pillow. He rises at 5:30 with an alarm. Total sleep time is estimated to exceed 7-1/2 hours- more likely 8 hours. He feels refreshed and rested. Medical history ; HTN, obesity, high cholesterol. Social history: 3 stepdaughters, 1 biological.   Interval health history from 07/24/2016. I have the pleasure of seeing Mr. [name] here today who has been a highly compliant CPAP user. His CPAP compliance visit 100% for the last 30 days with an average of 8 hours and 21 minutes, his expiratory pressure relief is at 1 cm with 14 cm CPAP and his residual AHI is 1.6 he has only minor air leaks noted. The actual time used is 8 hours and 21 minutes per day the total used hours are 4559 hours since the machine was issued. The patient does not have diabetes, he has controlled hypertension with the help of only 1 medication, he does not have atrial fibrillation, no history of strokes, epilepsy or seizures, and no significant change in weight since last seen.   Review of Systems: Out of a complete 14 system review, the patient complains of only the following symptoms, and all other reviewed systems are negative. Snoring, weight gain-    all remain the same. Epworth score  0 , Fatigue severity score 9  , depression score 0   Social History   Socioeconomic History  . Marital status: Married    Spouse name: Not on file  . Number of children: 4  . Years of education: Not on file  . Highest education level: Not on file  Occupational History  . Occupation: Engineer, production  Social Needs  . Financial resource strain: Not on file  . Food insecurity:    Worry: Not on file    Inability: Not on file  . Transportation needs:    Medical: Not on file    Non-medical: Not on file  Tobacco Use  . Smoking status: Never Smoker  . Smokeless tobacco: Never Used  Substance and  Sexual Activity  . Alcohol use: No    Alcohol/week: 0.0 standard drinks  . Drug use: No  . Sexual activity: Yes  Lifestyle  . Physical activity:    Days per week: Not on file    Minutes per session: Not on file  . Stress: Not on file  Relationships  . Social connections:    Talks on phone: Not on file    Gets together: Not on file  Attends religious service: Not on file    Active member of club or organization: Not on file    Attends meetings of clubs or organizations: Not on file    Relationship status: Not on file  . Intimate partner violence:    Fear of current or ex partner: Not on file    Emotionally abused: Not on file    Physically abused: Not on file    Forced sexual activity: Not on file  Other Topics Concern  . Not on file  Social History Narrative  . Not on file    Family History  Problem Relation Age of Onset  . Hyperlipidemia Father   . Cancer Maternal Grandfather   . Diabetes Paternal Grandmother   . Heart disease Paternal Grandmother   . Colon cancer Neg Hx   . Esophageal cancer Neg Hx   . Rectal cancer Neg Hx   . Stomach cancer Neg Hx     Past Medical History:  Diagnosis Date  . Allergy   . Back pain   . Glaucoma   . Hypertension   . MRSA carrier   . Sleep apnea    On CPAP    Past Surgical History:  Procedure Laterality Date  . 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 CERVICAL FUSION    . TONSILLECTOMY AND ADENOIDECTOMY      Current Outpatient Medications  Medication Sig Dispense Refill  . aspirin 81 MG tablet Take 81 mg by mouth daily.    . BENTYL 20 MG tablet Take 1 tablet (20 mg total) by mouth 2 (two) times daily. 180 tablet 0  . clotrimazole-betamethasone (LOTRISONE) cream Apply 1 application topically 2 (two) times daily. 30 g 0  . cyclobenzaprine (FLEXERIL) 5 MG tablet Take 1 tablet (5 mg total) by mouth 3 (three) times daily. (Patient taking differently: Take 5 mg by mouth as needed. ) 270 tablet 1  . doxycycline (DORYX) 100 MG EC  tablet Take 100 mg by mouth as needed.    Marland Kitchen ibuprofen (ADVIL,MOTRIN) 800 MG tablet Take 1 tablet (800 mg total) by mouth every 8 (eight) hours as needed. 180 tablet 0  . losartan (COZAAR) 50 MG tablet Take 1 tablet (50 mg total) by mouth daily. 90 tablet 3  . mupirocin ointment (BACTROBAN) 2 % Place 1 application into the nose 2 (two) times daily. 22 g 1  . omega-3 acid ethyl esters (LOVAZA) 1 g capsule Take 1 capsule (1 g total) by mouth 2 (two) times daily. 180 capsule 3  . travoprost, benzalkonium, (TRAVATAN) 0.004 % ophthalmic solution 1 drop at bedtime.    . travoprost, benzalkonium, (TRAVATAN) 0.004 % ophthalmic solution 1 drop at bedtime.     No current facility-administered medications for this visit.     Allergies as of 07/22/2018 - Review Complete 07/22/2018  Allergen Reaction Noted  . Biaxin [clarithromycin] Shortness Of Breath 03/25/2015  . Lidocaine Nausea Only 06/11/2018    Vitals: There were no vitals taken for this visit. Last Weight:  Wt Readings from Last 1 Encounters:  06/18/18 226 lb (102.5 kg)   ZOX:WRUEA is no height or weight on file to calculate BMI.     Last Height:   Ht Readings from Last 1 Encounters:  06/18/18 5\' 5"  (1.651 m)    Physical exam:  General: The patient is awake, alert and appears not in acute distress. The patient is well groomed, cooperative and pleasant. . Neck is short, thick.  Mallampati 3,  neck  circumference: 20" . Nasal airflow unrestricted. Retrognathia is seen. Cardiovascular:  Regular rate and rhythm, without  murmurs or carotid bruit, and without distended neck veins. Respiratory: Lungs are clear to auscultation.  Skin: Without evidence of edema, or rash.  Trunk: BMI was 36 kg/m2. The patient's posture is erect.  Neurologic exam :The patient is awake and alert, oriented to place and time.  Attention span & concentration ability appears normal.  Speech is fluent,  without dysarthria, dysphonia or aphasia.  Mood and affect are  appropriate.  Cranial nerves: Intact smell and taste sense. Pupils are equal and briskly reactive to light. Facial motor strength is symmetric, his  tongue and uvula move midline.  Shoulder shrug was symmetrical. Motor exam: Tone, muscle bulk and strength intact in all extremities.Deep tendon reflexes: in the  upper and lower extremities are symmetric and intact. Babinski maneuver response is downgoing. Gait and station intact.    The patient was advised of the nature of the diagnosed sleep disorder , the treatment options and risks for general a health and wellness arising from not treating the condition.  I spent more than 20 minutes of face to face time with the patient. Greater than 50% of time was spent in counseling and coordination of care. We have discussed the diagnosis and differential and I answered the patient's questions.    Assessment:  After physical and neurologic examination, review of laboratory studies,  Personal review of imaging studies, reports of other /same  Imaging studies -Results of polysomnography/ neurophysiology testing and pre-existing records as far as provided in visit., my assessment is:    1) Mr. Kolander is patient with a S10 CPAP set at good therapeutic pressure, and has high compliance data. He has been 100% compliant with CPAP use,  365 days of data were reviewed  He benefits form CPAP therapy, has no signs of fatigue or excessive daytime sleepiness.   [facility]- DME  Mr. Laury Deep shall be allowed to fly unrestricted. He is highly CPAP compliant at 100% , his medical conditions have been stable and he is compliant with medication as well.    Mr. Laury Deep has definitely benefited from the treatment and  has no residual daytime sleepiness.  He continues to be alert, refreshed and restored after nocturnal sleep.  Rv in 12 month with MD - Herminie, MD [address] , [place], [place] Voice: [phone]   Asencion Partridge Brenan Modesto MD  07/22/2018  CC: Pete Glatter, letter to Dr. Harden Mo, MD at [place] airport office. .    CC: Mancel Bale, [address] [place], [place] [address]

## 2019-01-23 ENCOUNTER — Ambulatory Visit: Payer: Federal, State, Local not specified - PPO | Admitting: Gastroenterology

## 2019-01-23 ENCOUNTER — Encounter: Payer: Self-pay | Admitting: Gastroenterology

## 2019-01-23 VITALS — BP 137/80 | HR 88 | Temp 95.7°F | Ht 65.0 in | Wt 218.0 lb

## 2019-01-23 DIAGNOSIS — K580 Irritable bowel syndrome with diarrhea: Secondary | ICD-10-CM

## 2019-01-23 MED ORDER — COLESEVELAM HCL 625 MG PO TABS: 625.0000 mg | ORAL_TABLET | Freq: Two times a day (BID) | ORAL | 0 refills | Status: DC

## 2019-01-23 NOTE — Patient Instructions (Signed)
If you are age 51 or older, your body mass index should be between 23-30. Your Body mass index is 36.28 kg/m. If this is out of the aforementioned range listed, please consider follow up with your Primary Care Provider.  If you are age 2 or younger, your body mass index should be between 19-25. Your Body mass index is 36.28 kg/m. If this is out of the aformentioned range listed, please consider follow up with your Primary Care Provider.   It was a pleasure to see you today!  Dr. Loletha Carrow

## 2019-01-23 NOTE — Progress Notes (Signed)
[name] Progress Note  Chief Complaint: IBS  Subjective  History: Last clinic visit was 06-21-18 for  IBS-D, for which dicyclomine had not been effective.  Samples of Viberzi 75 mg were given. Screening colonoscopy January 2020 with normal terminal ileum, normal colonic mucosa and biopsies negative for microscopic colitis, 3 tubular adenomas removed and left-sided diverticulosis.  Tyquavion was only able to take a couple tablets of the Viberzi, because it gave him a very heavy lower abdominal feeling "like a brick".  He continues the current dicyclomine 20 mg twice daily, and is uncertain whether it is really helping.  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    ROS: Cardiovascular:  no chest pain Respiratory: no dyspnea Chronic anxiety, that he feels is related to some issues with his adopted daughter (who is no longer living with them) and his job.  The patient's Past Medical, Family and Social History were reviewed and are on file in the EMR.  Objective:  Med list reviewed  Current Outpatient Medications:  .  aspirin 81 MG tablet, Take 81 mg by mouth daily., Disp: , Rfl:  .  BENTYL 20 MG tablet, Take 1 tablet (20 mg total) by mouth 2 (two) times daily., Disp: 180 tablet, Rfl: 0 .  clotrimazole-betamethasone (LOTRISONE) cream, Apply 1 application topically 2 (two) times daily., Disp: 30 g, Rfl: 0 .  cyclobenzaprine (FLEXERIL) 5 MG tablet, Take 1 tablet (5 mg total) by mouth 3 (three) times daily. (Patient taking differently: Take 5 mg by mouth as needed. ), Disp: 270 tablet, Rfl: 1 .  doxycycline (DORYX) 100 MG EC tablet, Take 100 mg by mouth as needed., Disp: , Rfl:  .  ibuprofen (ADVIL,MOTRIN) 800 MG tablet, Take 1 tablet (800 mg total) by mouth every 8 (eight) hours as needed., Disp: 180 tablet, Rfl: 0 .  losartan (COZAAR) 50 MG tablet, Take 1  tablet (50 mg total) by mouth daily., Disp: 90 tablet, Rfl: 3 .  mupirocin ointment (BACTROBAN) 2 %, Place 1 application into the nose 2 (two) times daily., Disp: 22 g, Rfl: 1 .  omega-3 acid ethyl esters (LOVAZA) 1 g capsule, Take 1 capsule (1 g total) by mouth 2 (two) times daily., Disp: 180 capsule, Rfl: 3 .  travoprost, benzalkonium, (TRAVATAN) 0.004 % ophthalmic solution, 1 drop at bedtime., Disp: , Rfl:  .  colesevelam (WELCHOL) 625 MG tablet, Take 1 tablet (625 mg total) by mouth 2 (two) times daily with a meal., Disp: 60 tablet, Rfl: 0   Vital signs in last 24 hrs: Vitals:   01/23/19 1050  BP: 137/80  Pulse: 88  Temp: (!) 95.7 F (35.4 C)  SpO2: 98%    Physical Exam  Well-appearing  HEENT: sclera anicteric, oral mucosa moist without lesions  Neck: supple, no thyromegaly, JVD or lymphadenopathy  Cardiac: RRR without murmurs, S1S2 heard, no peripheral edema  Pulm: clear to auscultation bilaterally, normal RR and effort noted  Abdomen: soft, no tenderness, with active bowel sounds. No guarding or palpable hepatosplenomegaly.  Skin; warm and dry, no jaundice or rash    @ASSESSMENTPLANBEGIN @ Assessment: Encounter Diagnosis  Name Primary?  . Irritable bowel syndrome with diarrhea Yes   I think there is a significant stress component to his symptoms.  He still has a gallbladder, but bile acid diarrhea is worth considering as a contributing factor.  No risk factors for bacterial overgrowth.  Plan:  Trial of WelChol 625 mg twice daily with breakfast and supper.  If not improved after several weeks, stop this medicine.  He will also stop the dicyclomine, since it is not clear how much it is helping.  If no improvement with above, I would like him to talk with primary care to consider pharmacologic therapy for anxiety such as SSRI.  I would not use Librax as it could be sedating and therefore unsafe at his job.  Total time 20 minutes, over half spent face-to-face with  patient in counseling and coordination of care.   Nelida Meuse III

## 2019-02-16 ENCOUNTER — Other Ambulatory Visit: Payer: Self-pay | Admitting: Gastroenterology

## 2019-03-19 ENCOUNTER — Other Ambulatory Visit: Payer: Self-pay | Admitting: Gastroenterology

## 2019-04-13 ENCOUNTER — Other Ambulatory Visit: Payer: Self-pay | Admitting: Gastroenterology

## 2019-05-15 ENCOUNTER — Other Ambulatory Visit: Payer: Self-pay | Admitting: Gastroenterology

## 2019-06-09 ENCOUNTER — Other Ambulatory Visit: Payer: Self-pay | Admitting: Gastroenterology

## 2019-06-11 ENCOUNTER — Encounter: Payer: Self-pay | Admitting: Neurology

## 2019-06-11 ENCOUNTER — Other Ambulatory Visit: Payer: Self-pay

## 2019-06-11 ENCOUNTER — Ambulatory Visit: Payer: Federal, State, Local not specified - PPO | Admitting: Neurology

## 2019-06-11 VITALS — BP 141/97 | HR 78 | Temp 97.2°F | Ht 65.0 in | Wt 225.0 lb

## 2019-06-11 DIAGNOSIS — Z0289 Encounter for other administrative examinations: Secondary | ICD-10-CM

## 2019-06-11 DIAGNOSIS — G4733 Obstructive sleep apnea (adult) (pediatric): Secondary | ICD-10-CM

## 2019-06-11 DIAGNOSIS — Z9989 Dependence on other enabling machines and devices: Secondary | ICD-10-CM | POA: Diagnosis not present

## 2019-06-11 NOTE — Patient Instructions (Signed)
I have ordered a HST to confirm the degree of apnea, as I had no previous sleep test with you. I will then order an autotitration CPAP.

## 2019-06-11 NOTE — Progress Notes (Signed)
SLEEP MEDICINE CLINIC   Provider:  Larey Seat, MD   Referring Provider: Horald Pollen, * Primary Care Physician:  No primary care provider on file.  Chief Complaint  Patient presents with  . Follow-up    pt alone, rm 11. pt states that he is eligible for a new machine. DME [facility]    HPI:  Christopher [name] is a 54 y.o. male , seen here in a RV on 06-10-2018, He is here for CPAP compliance.  The patient has is usually been 100% compliant and this has been his pattern for many years now he is using an air sense 10 CPAP with a set pressure of 14 cmH2O 1 cm EPR and has a residual apnea count of 1/h.  Excellent control average user time 8 hours 9 minutes.  100% compliance by days and hours Epworth sleepiness score is endorsed at 0.       Dr. 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   I have the pleasure of looking back on 30 and 3 at 65 days of CPAP use the patient has been 100% compliant his residual AHI varies between 1.4 and 1.7/h CPAP is set at 14 cmH2O was 1 cm EPR in his average user time for the last 365 days has been 8 hours and 22 minutes.  He endorsed the Epworth Sleepiness Scale at 0 points, fatigue severity at 9 points both the lowest possible score.  He has noticed that there seems to be an internal air leak a little whistling sound that the machine admits when he exhales.  I have 2 options to bridge him over until he is due for new machine 1 is to increase the expiratory pressure release in order to ease the expiratory pressure on the machine as well and my second option would be to declare the machine nonfunctional and in that case the patient will get a new machine anyway., following up on CPAP compliance.  The patient has used the machine 30 out of 30 days with 100% compliance 4 hours.  Average use is  8 hours and 24 minutes, CPAP is set at 14 cmH2O was 1 cm EPR, residual AHI is 1.1.  There are minor air leaks noted.  This machine is an air sense 10 CPAP that can also be used as and autotitrator and should be able to give data about obstructive or central apneas when necessary. The patient reports that he can hardly sleep without his CPAP, he is very much used to using one.  He endorsed an Epworth score at 0 points fatigue severity at 9 points.  Also endorsed at the lowest possible level.  The needs to be no adjustment made, I will follow the patient yearly.  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    Dr. 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 The patient provided me with several copies of previous sleep studies. The patient also owns 2 machines- one of the normal longer downloadable through our software. [name]. presented with a sleep study from [place] [facility]. At the time in 4347 he was 54 year old gentleman referred for possible sleep disordered breathing he  reached an AHI of 88 with an oxygen nadir of 64%, had loud snoring, no PLMS were noted. He was titrated to 15 cm water pressure he used a nasal interface. This is a titration his AHI was reduced to 4.4 an improvement of over 90%. BiPAP was not attempted. On 15 cm the AHI was 0.8.  He then underwent a new titration on 11/12/2013 in [place], [place]. He was re-titrated to CPAP this time he had frequent periodic limb movements for an index of 20.4 but very few arousals. He was titrated to 14 cm water pressure but his AHI was 3.5. I would venture to say that he was under titrated.  The patient received a new  CPAP which he has used preferable since. He has used a new machine 329 out of 365 days which is at 90% compliance he has 100% compliance for the last 30  days his AHI is 1.7 on 14 cm water was 1 cm EPR. Average user time is 8 hours and 5 minutes. Serial number is [id number]. The patient endorsed today the Epworth Sleepiness Scale at 0 points. The patient is using CPAP now for almost a decade and is highly compliant.  Sleep habits are as follows: He goes to bed at 9:30 PM and usually falls asleep promptly. The bedroom is cool quiet and dark, he prefers to sleep on his side on one pillow. He rises at 5:30 with an alarm. Total sleep time is estimated to exceed 7-1/2 hours- more likely 8 hours. He feels refreshed and rested. Medical history ; HTN, obesity, high cholesterol. Social history: 3 stepdaughters, 1 biological.   Interval health history from 07/24/2016. I have the pleasure of seeing [name] here today who has been a highly compliant CPAP user. His CPAP compliance visit 100% for the last 30 days with an average of 8 hours and 21 minutes, his expiratory pressure relief is at 1 cm with 14 cm CPAP and his residual AHI is 1.6 he has only minor air leaks noted. The actual time used is 8 hours and 21 minutes per day the total used hours are 4559 hours since the machine was issued. The patient does not have diabetes, he has controlled hypertension with the help of only 1 medication, he does not have atrial fibrillation, no history of strokes, epilepsy or seizures, and no significant change in weight since last seen.   Review of Systems: Out of a complete 14 system review, the patient complains of only the following symptoms, and all other reviewed systems are negative. Snoring, weight gain-    all remain the same. Epworth score  0 , Fatigue severity score 9  , depression score 0   Social History   Socioeconomic History  . Marital status: Married    Spouse name: Not on file  . Number of children: 4  . Years of education: Not on file  . Highest education level: Not on file  Occupational History  . Occupation: Engineer, production    Tobacco Use  . Smoking status: Never Smoker  . Smokeless tobacco: Never Used  Substance and Sexual Activity  . Alcohol use: No    Alcohol/week: 0.0 standard drinks  . Drug use: No  . Sexual activity: Yes  Other Topics Concern  . Not on file  Social History Narrative  . Not on file   Social Determinants of Health   Financial Resource Strain:   . Difficulty of Paying Living Expenses: Not on file  Food Insecurity:   . Worried About Charity fundraiser in the Last Year: Not on file  . Ran Out of Food in the Last Year: Not on file  Transportation Needs:   . Lack of Transportation (Medical): Not on file  . Lack of Transportation (Non-Medical): Not on file  Physical Activity:   . Days of Exercise per Week: Not on file  . Minutes of Exercise per Session: Not on file  Stress:   . Feeling of Stress : Not on file  Social Connections:   . Frequency of Communication with Friends and Family: Not on file  . Frequency of Social Gatherings with Friends and Family: Not on file  . Attends Religious Services: Not on file  . Active Member of Clubs or Organizations: Not on file  . Attends Archivist Meetings: Not on file  . Marital Status: Not on file  Intimate Partner Violence:   . Fear of Current or Ex-Partner: Not on file  . Emotionally Abused: Not on file  . Physically Abused: Not on file  . Sexually Abused: Not on file    Family History  Problem Relation Age of Onset  . Hyperlipidemia Father   . Cancer Maternal Grandfather   . Diabetes Paternal Grandmother   . Heart disease Paternal Grandmother   . Colon cancer Neg Hx   . Esophageal cancer Neg Hx   . Rectal cancer Neg Hx   . Stomach cancer Neg Hx     Past Medical History:  Diagnosis Date  . Allergy   . Back pain   . Glaucoma   . Hypertension   . MRSA carrier   . Sleep apnea    On CPAP    Past Surgical History:  Procedure Laterality Date  . 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 CERVICAL FUSION    .  COLONOSCOPY    . TONSILLECTOMY AND ADENOIDECTOMY      Current Outpatient Medications  Medication Sig Dispense Refill  . aspirin 81 MG tablet Take 81 mg by mouth daily.    . clotrimazole-betamethasone (LOTRISONE) cream Apply 1 application topically 2 (two) times daily. 30 g 0  . colesevelam (WELCHOL) 625 MG tablet TAKE 1 TABLET (625 MG TOTAL) BY MOUTH 2 (TWO) TIMES DAILY WITH A MEAL. 60 tablet 0  . cyclobenzaprine (FLEXERIL) 5 MG tablet Take 1 tablet (5 mg total) by mouth 3 (three) times daily. (Patient taking differently: Take 5 mg by mouth as needed. ) 270 tablet 1  . doxycycline (DORYX) 100 MG EC tablet Take 100 mg by mouth as needed.    Marland Kitchen ibuprofen (ADVIL,MOTRIN) 800 MG tablet Take 1 tablet (800 mg total) by mouth every 8 (eight) hours as needed. 180 tablet 0  . losartan (COZAAR) 50 MG tablet Take 1 tablet (50 mg total) by mouth daily. 90 tablet 3  . mupirocin ointment (BACTROBAN) 2 % Place 1 application into the nose 2 (two) times daily. 22 g 1  . omega-3 acid ethyl esters (LOVAZA) 1 g capsule Take 1 capsule (1 g total) by mouth 2 (two) times daily. 180 capsule 3  . travoprost, benzalkonium, (TRAVATAN) 0.004 % ophthalmic solution 1 drop at bedtime.     No current facility-administered medications for this visit.    Allergies as of 06/11/2019 - Review Complete 06/11/2019  Allergen Reaction Noted  . Biaxin [clarithromycin] Shortness Of Breath 03/25/2015  . Lidocaine Nausea Only 06/11/2018    Vitals: BP (!) 141/97   Pulse 78  Temp (!) 97.2 F (36.2 C)   Ht 5\' 5"  (1.651 m)   Wt 225 lb (102.1 kg)   BMI 37.44 kg/m  Last Weight:  Wt Readings from Last 1 Encounters:  06/11/19 225 lb (102.1 kg)   [id number] mass index is 37.44 kg/m.     Last Height:   Ht Readings from Last 1 Encounters:  06/11/19 5\' 5"  (1.651 m)    Physical exam:  General: The patient is awake, alert and appears not in acute distress. The patient is well groomed, cooperative and pleasant. . Neck is short,  thick.  Mallampati 3,  neck circumference: 20" . Nasal airflow unrestricted. Retrognathia is seen. Cardiovascular:  Regular rate and rhythm, without  murmurs or carotid bruit, and without distended neck veins. Respiratory: Lungs are clear to auscultation.  Skin: Without evidence of edema, or rash.  Trunk: BMI was 36 kg/m2. The patient's posture is erect.  Neurologic exam :The patient is awake and alert, oriented to place and time.  Attention span & concentration ability appears normal.  Speech is fluent,  without dysarthria, dysphonia or aphasia.  Mood and affect are appropriate.  Cranial nerves: Intact smell and taste sense. Pupils are equal and briskly reactive to light. Facial motor strength is symmetric, his  tongue and uvula move midline.  Shoulder shrug was symmetrical. Motor exam: Tone, muscle bulk and strength intact in all extremities.Deep tendon reflexes: in the  upper and lower extremities are symmetric and intact. Babinski maneuver response is downgoing. Gait and station intact.   Assessment:  After physical and neurologic examination, review of laboratory studies,  Personal review of imaging studies, reports of other /same  Imaging studies -Results of polysomnography/ neurophysiology testing and pre-existing records as far as provided in visit., my assessment is:    1) Mr. Quiggins is in need of a new CPAP machine and has excellent compliance data. He has been 100% compliant with CPAP use,  365 days of data were reviewed  He benefits from CPAP therapy, has no signs of fatigue or excessive daytime sleepiness. Rv in 90 days.    [facility]- DME  Mr. Laury Deep shall be allowed to fly unrestricted. He is highly CPAP compliant at 100% , his medical conditions have been stable and he is compliant with medication as well.    Mr. Laury Deep has definitely benefited from the treatment and  has no residual daytime sleepiness.  He continues to be alert, refreshed and restored after nocturnal  sleep.      Loraine Leriche, MD [address] , [place], [place] Voice: [phone]   Asencion Partridge Acey Woodfield MD  06/11/2019  CC: Pete Glatter, letter to Dr. Harden Mo, MD at [place] airport office. .    CCHorald Pollen, [place] [address] [place],  [place] [address]

## 2019-06-24 ENCOUNTER — Telehealth: Payer: Self-pay | Admitting: Neurology

## 2019-06-24 ENCOUNTER — Encounter: Payer: Self-pay | Admitting: Neurology

## 2019-06-24 NOTE — Telephone Encounter (Signed)
Pt has called stating that he has spoken with [employer] and they are telling him that a sleep study is not needed in order for him to get a new CPAP.  Pt states he has been on CPAP's since 2009, he was told by [facility] that there are documents that Dr Brett Fairy can submit to their PA dept for the approval of the CPAP.  Please call

## 2019-06-24 NOTE — Telephone Encounter (Signed)
I have already completed the forms from his DME company. She signed off on the new machine.

## 2019-06-25 NOTE — Telephone Encounter (Signed)
Called the patient and advised that we sent the order over for him for the new auto CPAP machine. Advised that he should reach out to [facility] to see about starting his new machine prior to Jan 31,2021 in order to keep the 3/1 apt in the 31-90 day window. Patient states he will do back to back apts if necessary but he would need to keep the 3/1 apt due to needing print outs for the last year for his employer.

## 2019-06-30 NOTE — Telephone Encounter (Signed)
Christopher [name] with [facility] called to advise that they received the detail report and OV notes however after further evaluation they realize the machine setting is needing to be at 14 and they have sent over a new referral for completion.   CB#[phone]

## 2019-06-30 NOTE — Telephone Encounter (Signed)
I have  Not received the paperwork that she mentioned but Dr. Brett Fairy recent order is for auto CPAP. I have printed and faxed that and attention the fax to Sharyn Blitz.

## 2019-07-27 ENCOUNTER — Encounter: Payer: Self-pay | Admitting: Neurology

## 2019-07-28 ENCOUNTER — Encounter: Payer: Self-pay | Admitting: Neurology

## 2019-07-28 ENCOUNTER — Other Ambulatory Visit: Payer: Self-pay

## 2019-07-28 ENCOUNTER — Ambulatory Visit: Payer: Federal, State, Local not specified - PPO | Admitting: Neurology

## 2019-07-28 VITALS — BP 158/86 | HR 79 | Temp 96.0°F | Ht 65.0 in | Wt 227.0 lb

## 2019-07-28 DIAGNOSIS — G4733 Obstructive sleep apnea (adult) (pediatric): Secondary | ICD-10-CM

## 2019-07-28 DIAGNOSIS — Z0289 Encounter for other administrative examinations: Secondary | ICD-10-CM

## 2019-07-28 NOTE — Progress Notes (Signed)
SLEEP MEDICINE CLINIC   Provider:  Larey Seat, MD   Referring Provider: Horald [name], * Primary Care Physician:  Christopher Hams, FNP  Chief Complaint  Patient presents with  . Follow-up    pt alone, rm 1. pt here with old machine from [place]. he just got the new machine 2 wks ago. DME [facility]    HPI:  Christopher [name] is a 54 y.o. male , here to be evaluated for his new CPAP machine's settings and compliance.. he had Covid 56 -  Have the pleasure of meeting [name] his compliance has been 100% for the 30-day download and 97% for the home the day download with an average user time of 8 hours 24 minutes, his CPAP is set to 14 cmH2O pressure was 1 cm EPR his AHI is 1.6 there is minimal air leakage noted his Epworth Sleepiness Scale was endorsed at 0 points. He will actually start with his new machine tonight.  He will need to provide [place] the serial number for the new machine.       He was last seen here in a RV on 06-10-2018. He is here for CPAP compliance.  The patient has is usually been 100% compliant and this has been his pattern for many years now he is using an air sense 10 CPAP with a set pressure of 14 cmH2O 1 cm EPR and has a residual apnea count of 1/h.  Excellent control average user time 8 hours 9 minutes.  100% compliance by days and hours Epworth sleepiness score is endorsed at 0 Dr. 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   I have the pleasure of looking back on 30 and 3 at 65 days of CPAP use the patient has been 100% compliant his residual AHI varies between 1.4 and 1.7/h CPAP is set at 14 cmH2O was 1 cm EPR in his average user time for the last 365 days has been 8 hours and 22 minutes.  He endorsed the Epworth Sleepiness Scale at 0 points, fatigue severity at 9 points both the lowest  possible score.  He has noticed that there seems to be an internal air leak a little whistling sound that the machine admits when he exhales.  I have 2 options to bridge him over until he is due for new machine 1 is to increase the expiratory pressure release in order to ease the expiratory pressure on the machine as well and my second option would be to declare the machine nonfunctional and in that case the patient will get a new machine anyway., following up on CPAP compliance.  The patient has used the machine 30 out of 30 days with 100% compliance 4 hours.  Average use is 8 hours and 24 minutes, CPAP is set at 14 cmH2O was 1 cm EPR, residual AHI is 1.1.  There are minor air leaks noted.  This machine is an air sense 10 CPAP that can also be used as and autotitrator and should be able to give data about obstructive or central apneas when necessary. The patient reports that he can hardly sleep without his CPAP, he is very much used to using one.  He endorsed an Epworth score at 0 points fatigue severity at 9 points.  Also endorsed at the lowest possible level.  The needs to be no adjustment  made, I will follow the patient yearly.  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    Dr. 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 The patient provided me with several copies of previous sleep studies. The patient also owns 2 machines- one of the normal longer downloadable through our software. [name]. presented with a sleep study from [place] [facility]. At the time in 3147 he was 54 year old gentleman referred for possible sleep disordered breathing he reached an AHI of 88 with an oxygen nadir of 64%, had loud snoring, no PLMS were noted. He was titrated to 15 cm water pressure he used a nasal interface. This is a titration his AHI was  reduced to 4.4 an improvement of over 90%. BiPAP was not attempted. On 15 cm the AHI was 0.8.  He then underwent a new titration on 11/12/2013 in [place], [place]. He was re-titrated to CPAP this time he had frequent periodic limb movements for an index of 20.4 but very few arousals. He was titrated to 14 cm water pressure but his AHI was 3.5. I would venture to say that he was under titrated.  The patient received a new  CPAP which he has used preferable since. He has used a new machine 329 out of 365 days which is at 90% compliance he has 100% compliance for the last 30 days his AHI is 1.7 on 14 cm water was 1 cm EPR. Average user time is 8 hours and 5 minutes. Serial number is [id number]. The patient endorsed [name] the Epworth Sleepiness Scale at 0 points. The patient is using CPAP now for almost a decade and is highly compliant.  Sleep habits are as follows: He goes to bed at 9:30 PM and usually falls asleep promptly. The bedroom is cool quiet and dark, he prefers to sleep on his side on one pillow. He rises at 5:30 with an alarm. Total sleep time is estimated to exceed 7-1/2 hours- more likely 8 hours. He feels refreshed and rested. Medical history ; HTN, obesity, high cholesterol. Social history: 3 stepdaughters, 1 biological.   Interval health history from 07/24/2016. I have the pleasure of seeing Christopher [name] here [name] who has been a highly compliant CPAP user. His CPAP compliance visit 100% for the last 30 days with an average of 8 hours and 21 minutes, his expiratory pressure relief is at 1 cm with 14 cm CPAP and his residual AHI is 1.6 he has only minor air leaks noted. The actual time used is 8 hours and 21 minutes per day the total used hours are 4559 hours since the machine was issued. The patient does not have diabetes, he has controlled hypertension with the help of only 1 medication, he does not have atrial fibrillation, no history of strokes, epilepsy or seizures, and no  significant change in weight since last seen.   Review of Systems: Out of a complete 14 system review, the patient complains of only the following symptoms, and all other reviewed systems are negative. Snoring, weight gain-    all remain the same. Epworth score  0 , Fatigue severity score 9  , depression score 0   Social History   Socioeconomic History  . Marital status: Married    Spouse name: Not on file  .  Number of children: 4  . Years of education: Not on file  . Highest education level: Not on file  Occupational History  . Occupation: Engineer, production  Tobacco Use  . Smoking status: Never Smoker  . Smokeless tobacco: Never Used  Substance and Sexual Activity  . Alcohol use: No    Alcohol/week: 0.0 standard drinks  . Drug use: No  . Sexual activity: Yes  Other Topics Concern  . Not on file  Social History Narrative  . Not on file   Social Determinants of Health   Financial Resource Strain:   . Difficulty of Paying Living Expenses: Not on file  Food Insecurity:   . Worried About Charity fundraiser in the Last Year: Not on file  . Ran Out of Food in the Last Year: Not on file  Transportation Needs:   . Lack of Transportation (Medical): Not on file  . Lack of Transportation (Non-Medical): Not on file  Physical Activity:   . Days of Exercise per Week: Not on file  . Minutes of Exercise per Session: Not on file  Stress:   . Feeling of Stress : Not on file  Social Connections:   . Frequency of Communication with Friends and Family: Not on file  . Frequency of Social Gatherings with Friends and Family: Not on file  . Attends Religious Services: Not on file  . Active Member of Clubs or Organizations: Not on file  . Attends Archivist Meetings: Not on file  . Marital Status: Not on file  Intimate Partner Violence:   . Fear of Current or Ex-Partner: Not on file  . Emotionally Abused: Not on file  . Physically Abused: Not on file  . Sexually Abused:  Not on file    Family History  Problem Relation Age of Onset  . Hyperlipidemia Father   . Cancer Maternal Grandfather   . Diabetes Paternal Grandmother   . Heart disease Paternal Grandmother   . Colon cancer Neg Hx   . Esophageal cancer Neg Hx   . Rectal cancer Neg Hx   . Stomach cancer Neg Hx     Past Medical History:  Diagnosis Date  . Allergy   . Back pain   . Glaucoma   . Hypertension   . MRSA carrier   . Sleep apnea    On CPAP    Past Surgical History:  Procedure Laterality Date  . 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 CERVICAL FUSION    . COLONOSCOPY    . TONSILLECTOMY AND ADENOIDECTOMY      Current Outpatient Medications  Medication Sig Dispense Refill  . aspirin 81 MG tablet Take 81 mg by mouth daily.    . clotrimazole-betamethasone (LOTRISONE) cream Apply 1 application topically 2 (two) times daily. 30 g 0  . cyclobenzaprine (FLEXERIL) 5 MG tablet Take 1 tablet (5 mg total) by mouth 3 (three) times daily. (Patient taking differently: Take 5 mg by mouth as needed. ) 270 tablet 1  . ibuprofen (ADVIL,MOTRIN) 800 MG tablet Take 1 tablet (800 mg total) by mouth every 8 (eight) hours as needed. 180 tablet 0  . losartan (COZAAR) 50 MG tablet Take 1 tablet (50 mg total) by mouth daily. 90 tablet 3  . omega-3 acid ethyl esters (LOVAZA) 1 g capsule Take 1 capsule (1 g total) by mouth 2 (two) times daily. 180 capsule 3  . travoprost, benzalkonium, (TRAVATAN) 0.004 % ophthalmic solution 1 drop at bedtime.  No current facility-administered medications for this visit.    Allergies as of 07/28/2019 - Review Complete 07/28/2019  Allergen Reaction Noted  . Biaxin [clarithromycin] Shortness Of Breath 03/25/2015  . Lidocaine Nausea Only 06/11/2018    Vitals: BP (!) 158/86   Pulse 79   Temp (!) 96 F (35.6 C)   Ht 5\' 5"  (1.651 m)   Wt 227 lb (103 kg)   BMI 37.77 kg/m  Last Weight:  Wt Readings from Last 1 Encounters:  07/28/19 227 lb (103 kg)   [id number] mass index  is 37.77 kg/m.     Last Height:   Ht Readings from Last 1 Encounters:  07/28/19 5\' 5"  (1.651 m)    Physical exam:  General: The patient is awake, alert and appears not in acute distress. The patient is well groomed, cooperative and pleasant. . Neck is short, thick.  Mallampati 3,  neck circumference: 20" . Nasal airflow unrestricted. Retrognathia is seen. Cardiovascular:  Regular rate and rhythm, without  murmurs or carotid bruit, and without distended neck veins. Respiratory: Lungs are clear to auscultation.  Skin: Without evidence of edema, or rash.  Trunk: BMI was 36 kg/m2. The patient's posture is erect.  Neurologic exam :The patient is awake and alert, oriented to place and time.  Attention span & concentration ability appears normal.  Speech is fluent,  without dysarthria, dysphonia or aphasia.  Mood and affect are appropriate.  Cranial nerves: Intact smell and taste sense. Pupils are equal and briskly reactive to light. Facial motor strength is symmetric, his  tongue and uvula move midline.  Shoulder shrug was symmetrical. Motor exam: Tone, muscle bulk and strength intact in all extremities.[name] tendon reflexes: in the  upper and lower extremities are symmetric and intact. Babinski maneuver response is downgoing. Gait and station intact.   Assessment:  After physical and neurologic examination, review of laboratory studies,  Personal review of imaging studies, reports of other /same  Imaging studies -Results of polysomnography/ neurophysiology testing and pre-existing records as far as provided in visit., my assessment is:    1) Christopher [name] is in need of a new CPAP machine and has excellent compliance data on the current one . He has been 100% compliant with CPAP use,  363 days of data were reviewed .  He benefits from CPAP therapy, has no signs of fatigue or excessive daytime sleepiness.  He will revisit with [place] aftr he started using his new machine , we will to send a letter to  [facility] [name]. Marland Kitchen    [facility]- DME   07-28-2019: Christopher [name] shall be allowed to fly unrestricted. He is highly CPAP compliant at 100% , his medical conditions have been stable and he is compliant with medication as well.    Christopher [name] has definitely benefited from the treatment and  has no residual daytime sleepiness.  He continues to be alert, refreshed and restored after nocturnal sleep.    CC to -   Loraine Leriche, MD [address] , [place], [place] Voice: [phone]   Asencion Partridge Kaleya Douse MD  07/28/2019  CC: Pete Glatter, letter to Dr. Harden Mo, MD at [place] airport office. .    CCHorald [name], [place] [address] [place],  [place] [address]

## 2019-07-28 NOTE — Patient Instructions (Signed)
07-28-2019:   [name] shall be allowed to fly unrestricted. He is highly CPAP compliant at 100% , his medical conditions have been stable and he is compliant with medication as well.    [name] has definitely benefited from the treatment and  has no residual daytime sleepiness.  He continues to be alert, refreshed and restored after nocturnal sleep.    CC to -   Loraine Leriche, MD [address] , [employer], [place] Voice: [phone]   Asencion Partridge Atiya Yera MD  07/28/2019  CC: Pete Glatter, letter to Dr. Harden Mo, MD at [place] airport office. Marland Kitchen

## 2019-09-24 ENCOUNTER — Encounter: Payer: Self-pay | Admitting: Neurology

## 2019-09-29 ENCOUNTER — Ambulatory Visit: Payer: Federal, State, Local not specified - PPO | Admitting: Neurology

## 2019-09-29 ENCOUNTER — Other Ambulatory Visit: Payer: Self-pay

## 2019-09-29 ENCOUNTER — Encounter: Payer: Self-pay | Admitting: Neurology

## 2019-09-29 VITALS — BP 148/94 | HR 81 | Temp 97.0°F

## 2019-09-29 DIAGNOSIS — G4733 Obstructive sleep apnea (adult) (pediatric): Secondary | ICD-10-CM | POA: Diagnosis not present

## 2019-09-29 DIAGNOSIS — Z9989 Dependence on other enabling machines and devices: Secondary | ICD-10-CM

## 2019-09-29 DIAGNOSIS — E669 Obesity, unspecified: Secondary | ICD-10-CM

## 2019-09-29 DIAGNOSIS — Z0289 Encounter for other administrative examinations: Secondary | ICD-10-CM

## 2019-09-29 NOTE — Progress Notes (Signed)
SLEEP MED[facility]INE CLINIC   Provider:  Larey Seat, MD   Referring Provider: Gregor Hams, FNP Primary Care Physician:  Gregor Hams, FNP  Chief Complaint  Patient presents with  . Follow-up    pt alone, rm 11. pt presents today to the visit just for insurance reasons to address initial CPAP. DME [facility]    HPI:  Clayson Deubel is a 54 year-old male , here to be evaluated for his new CPAP machine's settings and compliance. He had Covid 19 - in 2020-   He has minimally received both parts of the [facility] [place] messenger RNA-based Covid vaccine.  He needs for insurance reasons just to address initial CPAP with a new machine had just been issued.  The patient was 100% compliant CPAP user with an average use at time of 8 hours and 3 minutes, his AutoSet has a minimum setting of 6 maximum of 16 with 1 cm expiratory pressure relief his residual AHI is 2.5 there only some obstructive apneas still present no central apneas have emerged.  The 95th percentile pressure is 14.7 cmH2O which straddles close to the maximum setting allowed on his auto CPAP.  He denies any resurgence of daytime sleepiness the Epworth sleepiness score was endorsed at 0 out of 24 points.  The new machine has a serial #[id number].  Based on these data I have no problem of certifying Mr. Casten Gregorich requirements and his insurance requirements for compliance.     07-28-2019- I Have the pleasure of meeting Mr. Demmons today his compliance has been 100% for the 30-day download and 97% for the home the day download with an average user time of 8 hours 24 minutes, his CPAP is set to 14 cmH2O pressure was 1 cm EPR his AHI is 1.6 there is minimal air leakage noted his Epworth Sleepiness Scale was endorsed at 0 points. He will actually start with his new machine tonight.  He will need to provide [place] the serial number for the new machine.   He was last seen here in a RV on 06-10-2018. He is here for CPAP compliance.   The patient has is usually been 100% compliant and this has been his pattern for many years now he is using an air sense 10 CPAP with a set pressure of 14 cmH2O 1 cm EPR and has a residual apnea count of 1/h.  Excellent control average user time 8 hours 9 minutes.  100% compliance by days and hours Epworth sleepiness score is endorsed at 0 Dr. 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   I have the pleasure of looking back on 30 and 3 at 65 days of CPAP use the patient has been 100% compliant his residual AHI varies between 1.4 and 1.7/h CPAP is set at 14 cmH2O was 1 cm EPR in his average user time for the last 365 days has been 8 hours and 22 minutes.  He endorsed the Epworth Sleepiness Scale at 0 points, fatigue severity at 9 points both the lowest possible score.  He has noticed that there seems to be an internal air leak a little whistling sound that the machine admits when he exhales.  I have 2 options to bridge him over until he is due for new machine 1 is to increase the expiratory pressure release in order to ease the expiratory  pressure on the machine as well and my second option would be to declare the machine nonfunctional and in that case the patient will get a new machine anyway., following up on CPAP compliance.  The patient has used the machine 30 out of 30 days with 100% compliance 4 hours.  Average use is 8 hours and 24 minutes, CPAP is set at 14 cmH2O was 1 cm EPR, residual AHI is 1.1.  There are minor air leaks noted.  This machine is an air sense 10 CPAP that can also be used as and autotitrator and should be able to give data about obstructive or central apneas when necessary. The patient reports that he can hardly sleep without his CPAP, he is very much used to using one.  He endorsed an Epworth score at 0 points fatigue  severity at 9 points.  Also endorsed at the lowest possible level.  The needs to be no adjustment made, I will follow the patient yearly.  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    Dr. 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 The patient provided me with several copies of previous sleep studies. The patient also owns 2 machines- one of the normal longer downloadable through our software. [name]. presented with a sleep study from [place] [facility]. At the time in 2975 he was 54 year old gentleman referred for possible sleep disordered breathing he reached an AHI of 88 with an oxygen nadir of 64%, had loud snoring, no PLMS were noted. He was titrated to 15 cm water pressure he used a nasal interface. This is a titration his AHI was reduced to 4.4 an improvement of over 90%. BiPAP was not attempted. On 15 cm the AHI was 0.8.  He then underwent a new titration on 11/12/2013 in [place], [place]. He was re-titrated to CPAP this time he had frequent periodic limb movements for an index of 20.4 but very few arousals. He was titrated to 14 cm water pressure but his AHI was 3.5. I would venture to say that he was under titrated.  The patient received a new  CPAP which he has used preferable since. He has used a new machine 329 out of 365 days which is at 90% compliance he has 100% compliance for the last 30 days his AHI is 1.7 on 14 cm water was 1 cm EPR. Average user time is 8 hours and 5 minutes. Serial number is [id number]. The patient endorsed today the Epworth Sleepiness Scale at 0 points. The patient is using CPAP now for almost a decade and is highly compliant.  Sleep habits are as follows: He goes to bed at 9:30 PM and usually falls asleep promptly. The bedroom is cool quiet and dark, he prefers to sleep on his  side on one pillow. He rises at 5:30 with an alarm. Total sleep time is estimated to exceed 7-1/2 hours- more likely 8 hours. He feels refreshed and rested. Medical history ; HTN, obesity, high cholesterol. Social history: 3 stepdaughters, 1 biological.   Interval health history from 07/24/2016. I have the pleasure of seeing Mr. [name] here today who has been a highly compliant CPAP user. His CPAP compliance visit 100% for the last 30 days with an average of 8 hours and 21 minutes, his  expiratory pressure relief is at 1 cm with 14 cm CPAP and his residual AHI is 1.6 he has only minor air leaks noted. The actual time used is 8 hours and 21 minutes per day the total used hours are 4559 hours since the machine was issued. The patient does not have diabetes, he has controlled hypertension with the help of only 1 medication, he does not have atrial fibrillation, no history of strokes, epilepsy or seizures, and no significant change in weight since last seen.   Review of Systems: Out of a complete 14 system review, the patient complains of only the following symptoms, and all other reviewed systems are negative. Snoring, weight gain-    all remain the same. Epworth score  0 , Fatigue severity score 9  , depression score 0   Social History   Socioeconomic History  . Marital status: Married    Spouse name: Not on file  . Number of children: 4  . Years of education: Not on file  . Highest education level: Not on file  Occupational History  . Occupation: Engineer, production  Tobacco Use  . Smoking status: Never Smoker  . Smokeless tobacco: Never Used  Substance and Sexual Activity  . Alcohol use: No    Alcohol/week: 0.0 standard drinks  . Drug use: No  . Sexual activity: Yes  Other Topics Concern  . Not on file  Social History Narrative  . Not on file   Social Determinants of Health   Financial Resource Strain:   . Difficulty of Paying Living Expenses:   Food Insecurity:   .  Worried About Charity fundraiser in the Last Year:   . Arboriculturist in the Last Year:   Transportation Needs:   . Film/video editor (Medical):   Marland Kitchen Lack of Transportation (Non-Medical):   Physical Activity:   . Days of Exercise per Week:   . Minutes of Exercise per Session:   Stress:   . Feeling of Stress :   Social Connections:   . Frequency of Communication with Friends and Family:   . Frequency of Social Gatherings with Friends and Family:   . Attends Religious Services:   . Active Member of Clubs or Organizations:   . Attends Archivist Meetings:   Marland Kitchen Marital Status:   Intimate Partner Violence:   . Fear of Current or Ex-Partner:   . Emotionally Abused:   Marland Kitchen Physically Abused:   . Sexually Abused:     Family History  Problem Relation Age of Onset  . Hyperlipidemia Father   . Cancer Maternal Grandfather   . Diabetes Paternal Grandmother   . Heart disease Paternal Grandmother   . Colon cancer Neg Hx   . Esophageal cancer Neg Hx   . Rectal cancer Neg Hx   . Stomach cancer Neg Hx     Past Medical History:  Diagnosis Date  . Allergy   . Back pain   . Glaucoma   . Hypertension   . MRSA carrier   . Sleep apnea    On CPAP    Past Surgical History:  Procedure Laterality Date  . 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 CERVICAL FUSION    . COLONOSCOPY    . TONSILLECTOMY AND ADENOIDECTOMY      Current Outpatient Medications  Medication Sig Dispense Refill  . aspirin 81 MG tablet Take 81 mg by mouth daily.    . clotrimazole-betamethasone (LOTRISONE) cream Apply  1 application topically 2 (two) times daily. 30 g 0  . cyclobenzaprine (FLEXERIL) 5 MG tablet Take 1 tablet (5 mg total) by mouth 3 (three) times daily. (Patient taking differently: Take 5 mg by mouth as needed. ) 270 tablet 1  . ibuprofen (ADVIL,MOTRIN) 800 MG tablet Take 1 tablet (800 mg total) by mouth every 8 (eight) hours as needed. 180 tablet 0  . losartan (COZAAR) 50 MG tablet Take 1 tablet  (50 mg total) by mouth daily. 90 tablet 3  . omega-3 acid ethyl esters (LOVAZA) 1 g capsule Take 1 capsule (1 g total) by mouth 2 (two) times daily. 180 capsule 3  . travoprost, benzalkonium, (TRAVATAN) 0.004 % ophthalmic solution 1 drop at bedtime.     No current facility-administered medications for this visit.    Allergies as of 09/29/2019 - Review Complete 09/29/2019  Allergen Reaction Noted  . Biaxin [clarithromycin] Shortness Of Breath 03/25/2015  . Lidocaine Nausea Only 06/11/2018    Vitals: BP (!) 148/94   Pulse 81   Temp (!) 97 F (36.1 C)  Last Weight:  Wt Readings from Last 1 Encounters:  07/28/19 227 lb (103 kg)   [id number] is no height or weight on file to calculate BMI.     Last Height:   Ht Readings from Last 1 Encounters:  07/28/19 5\' 5"  (1.651 m)    Physical exam:  General: The patient is awake, alert and appears not in acute distress. The patient is well groomed, cooperative and pleasant. . Neck is short, thick.  Mallampati 3,  neck circumference: 20" . Nasal airflow unrestricted. Retrognathia is seen. Cardiovascular:  Regular rate and rhythm, without  murmurs or carotid bruit, and without distended neck veins. Respiratory: Lungs are clear to auscultation.  Skin: Without evidence of edema, or rash.  Trunk: BMI was 36 kg/m2. The patient's posture is erect.  Neurologic exam :The patient is awake and alert, oriented to place and time.  Attention span & concentration ability appears normal.  Speech is fluent,  without dysarthria, dysphonia or aphasia.  Mood and affect are appropriate.  Cranial nerves: Intact smell and taste sense. Pupils are equal and briskly reactive to light. Facial motor strength is symmetric, his  tongue and uvula move midline.  Shoulder shrug was symmetrical. Motor exam: Tone, muscle bulk and strength intact in all extremities.Deep tendon reflexes: in the  upper and lower extremities are symmetric and intact. Babinski maneuver response is  downgoing. Gait and station intact.   Assessment:  After physical and neurologic examination, review of laboratory studies,  Personal review of imaging studies, reports of other /same  Imaging studies -Results of polysomnography/ neurophysiology testing and pre-existing records as far as provided in visit., my assessment is:    1) Mr. Barcelo has shown excellent compliance in the use of his new CPAP machine and has excellent compliance data on the current one. He benefits from CPAP therapy, has no signs of fatigue or excessive daytime sleepiness.   [facility]- DME      CC: Gregor Hams, [place],  [place] [id number]

## 2019-09-29 NOTE — Patient Instructions (Signed)

## 2020-05-29 HISTORY — PX: LAMINECTOMY: SHX219

## 2020-08-10 ENCOUNTER — Ambulatory Visit: Payer: Federal, State, Local not specified - PPO | Admitting: Neurology

## 2020-08-10 ENCOUNTER — Encounter: Payer: Self-pay | Admitting: Neurology

## 2020-08-10 VITALS — BP 147/104 | HR 80 | Ht 65.0 in | Wt 228.0 lb

## 2020-08-10 DIAGNOSIS — G4733 Obstructive sleep apnea (adult) (pediatric): Secondary | ICD-10-CM

## 2020-08-10 DIAGNOSIS — Z9989 Dependence on other enabling machines and devices: Secondary | ICD-10-CM | POA: Diagnosis not present

## 2020-08-10 DIAGNOSIS — Z0289 Encounter for other administrative examinations: Secondary | ICD-10-CM

## 2020-08-10 NOTE — Progress Notes (Signed)
[facility]EP [facility]   Provider:  Larey Seat, MD   Referring Provider: Gregor Hams, FNP Primary Care Physician:  Christopher Croak, MD  Chief Complaint  Patient presents with  . Follow-up    Yearly follow up overall things are well    08-10-2020. RV for 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 The patient has been as usual highly compliant CPAP user for insurance purposes a 30-day download was reviewed with 100% compliance by hours and days with an average use at time of 8 hours and 21 minutes.  The settings are between 6 cm minimum pressure of 16 cmH2O maximum pressure without expiratory pressure relief the residual AHI is 1.6 which is an excellent resolution there are no central apneas arising.  95th percentile pressure is 15.7 so we could theoretically increase the maximum pressure by 1 or 2 cm to allow a little bit more room this may also help to treat any additional breakthrough snoring.  His 365 day download confirms equally good compliance and control. I will forward this to his Veterinary surgeon physician.  100% the residual AHI was 2.0, same settings as quoted above average user time 8 hours 22 minutes.  95th percentile pressure was 15.2 over the year. He gained about 5 pounds in the pandemic.     He had  3 Covid 19  Shots - fully vaccinated.  He has received both parts of the Moderna messenger RNA-based Covid vaccine.  He needs for insurance reasons just to address initial CPAP with a new machine had just been issued.  The patient was 100% compliant CPAP user with an average use at time of 8 hours and 3 minutes, his AutoSet has a minimum setting of 6 maximum of 16 with 1 cm expiratory pressure relief his residual AHI is 2.5 there only some obstructive apneas still present no central apneas have emerged.  The 95th percentile pressure is 14.7 cmH2O  which straddles close to the maximum setting allowed on his auto CPAP.  He denies any resurgence of daytime sleepiness the Epworth sleepiness score was endorsed at 0 out of 24 points.  The new machine has a serial #[id number].  Based on these data I have no problem of certifying Christopher [name] requirements and his insurance requirements for compliance.     07-28-2019- I Have the pleasure of meeting Christopher [name] today his compliance has been 100% for the 30-day download and 97% for the home the day download with an average user time of 8 hours 24 minutes, his CPAP is set to 14 cmH2O pressure was 1 cm EPR his AHI is 1.6 there is minimal air leakage noted his Epworth Sleepiness Scale was endorsed at 0 points. He will actually start with his new machine tonight.  He will need to provide [place] the serial number for the new machine.   He was last seen here in a RV on 06-10-2018. He is here for CPAP compliance.  The patient has is usually been 100% compliant and this has been his pattern for many years now he is using an air sense 10 CPAP with a set pressure of 14 cmH2O 1 cm EPR and has a residual apnea count of 1/h.  Excellent control average user time 8 hours 9 minutes.  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   I have the pleasure of looking back on 30 and 3 at 65 days of CPAP use the patient has been 100% compliant his residual AHI varies between 1.4 and 1.7/h CPAP is set at 14 cmH2O was 1 cm EPR in his average user time for the last 365 days has been 8 hours and 22 minutes.  He endorsed the Epworth Sleepiness Scale at 0 points, fatigue severity at 9 points both the lowest possible score.  He has noticed that there seems to be an internal air leak a little  whistling sound that the machine admits when he exhales.  I have 2 options to bridge him over until he is due for new machine 1 is to increase the expiratory pressure release in order to ease the expiratory pressure on the machine as well and my second option would be to declare the machine nonfunctional and in that case the patient will get a new machine anyway., following up on CPAP compliance.  The patient has used the machine 30 out of 30 days with 100% compliance 4 hours.  Average use is 8 hours and 24 minutes, CPAP is set at 14 cmH2O was 1 cm EPR, residual AHI is 1.1.  There are minor air leaks noted.  This machine is an air sense 10 CPAP that can also be used as and autotitrator and should be able to give data about obstructive or central apneas when necessary. The patient reports that he can hardly sleep without his CPAP, he is very much used to using one.  He endorsed an Epworth score at 0 points fatigue severity at 9 points.  Also endorsed at the lowest possible level.  The needs to be no adjustment made, I will follow the patient yearly.  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    Dr. 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 The patient provided me with several copies of previous sleep studies. The patient also owns 2 machines- one of the normal longer downloadable through our software. Christopher [name]. presented with a sleep study from [place] [facility]. At the time in 6435 he was 55 year old gentleman referred for possible sleep disordered breathing he reached an AHI of 88 with an oxygen nadir of 64%, had loud snoring, no PLMS were noted. He was titrated to 15 cm water pressure he used a nasal interface. This is a titration his AHI was reduced to 4.4 an improvement of over 90%. BiPAP was not attempted. On 15 cm the AHI  was 0.8.  He then underwent a new titration on 11/12/2013 in [place], [place]. He was re-titrated to CPAP this time he had frequent periodic limb movements for an index of 20.4 but very few arousals. He was titrated to 14 cm water pressure but his AHI was 3.5. I would venture to say that he was under titrated.  The patient received a new  CPAP which he has used preferable since. He has used a new machine 329 out of 365 days which is at 90% compliance he has 100% compliance for the last 30 days his AHI is 1.7 on  14 cm water was 1 cm EPR. Average user time is 8 hours and 5 minutes. Serial number is [id number]. The patient endorsed today the Epworth Sleepiness Scale at 0 points. The patient is using CPAP now for almost a decade and is highly compliant.  Sleep habits are as follows: He goes to bed at 9:30 PM and usually falls asleep promptly. The bedroom is cool quiet and dark, he prefers to sleep on his side on one pillow. He rises at 5:30 with an alarm. Total sleep time is estimated to exceed 7-1/2 hours- more likely 8 hours. He feels refreshed and rested. Medical history ; HTN, obesity, high cholesterol. Social history: 3 stepdaughters, 1 biological.   Interval health history from 07/24/2016. I have the pleasure of seeing Christopher [name] here today who has been a highly compliant CPAP user. His CPAP compliance visit 100% for the last 30 days with an average of 8 hours and 21 minutes, his expiratory pressure relief is at 1 cm with 14 cm CPAP and his residual AHI is 1.6 he has only minor air leaks noted. The actual time used is 8 hours and 21 minutes per day the total used hours are 4559 hours since the machine was issued. The patient does not have diabetes, he has controlled hypertension with the help of only 1 medication, he does not have atrial fibrillation, no history of strokes, epilepsy or seizures, and no significant change in weight since last seen.   Review of Systems: Out of a complete 14  system review, the patient complains of only the following symptoms, and all other reviewed systems are negative. Snoring, weight gain-   How likely are you to doze in the following situations: 0 = not likely, 1 = slight chance, 2 = moderate chance, 3 = high chance  Sitting and Reading? Watching Television? Sitting inactive in a public place (theater or meeting)? Lying down in the afternoon when circumstances permit? Sitting and talking to someone? Sitting quietly after lunch without alcohol? In a car, while stopped for a few minutes in traffic? As a passenger in a car for an hour without a break?  Total =0. No snoring break- through. No nocturia.      all remain the same. Epworth score  0 , Fatigue severity score 9  , depression score 0   Social History   Socioeconomic History  . Marital status: Married    Spouse name: Not on file  . Number of children: 4  . Years of education: Not on file  . Highest education level: Not on file  Occupational History  . Occupation: Engineer, production  Tobacco Use  . Smoking status: Never Smoker  . Smokeless tobacco: Never Used  Vaping Use  . Vaping Use: Never used  Substance and Sexual Activity  . Alcohol use: No    Alcohol/week: 0.0 standard drinks  . Drug use: No  . Sexual activity: Yes  Other Topics Concern  . Not on file  Social History Narrative  . Not on file   Social Determinants of Health   Financial Resource Strain: Not on file  Food Insecurity: Not on file  Transportation Needs: Not on file  Physical Activity: Not on file  Stress: Not on file  Social Connections: Not on file  Intimate Partner Violence: Not on file    Family History  Problem Relation Age of Onset  . Hyperlipidemia Father   . Cancer Maternal Grandfather   . Diabetes Paternal Grandmother   .  Heart disease Paternal Grandmother   . Colon cancer Neg Hx   . Esophageal cancer Neg Hx   . Rectal cancer Neg Hx   . Stomach cancer Neg Hx     Past  Medical History:  Diagnosis Date  . Allergy   . Back pain   . Glaucoma   . Hypertension   . MRSA carrier   . Sleep apnea    On CPAP    Past Surgical History:  Procedure Laterality Date  . 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 CERVICAL FUSION    . COLONOSCOPY    . TONSILLECTOMY AND ADENOIDECTOMY      Current Outpatient Medications  Medication Sig Dispense Refill  . aspirin 81 MG tablet Take 81 mg by mouth daily.    . clotrimazole-betamethasone (LOTRISONE) cream Apply 1 application topically 2 (two) times daily. 30 g 0  . cyclobenzaprine (FLEXERIL) 5 MG tablet Take 1 tablet (5 mg total) by mouth 3 (three) times daily. (Patient taking differently: Take 5 mg by mouth as needed.) 270 tablet 1  . ibuprofen (ADVIL,MOTRIN) 800 MG tablet Take 1 tablet (800 mg total) by mouth every 8 (eight) hours as needed. 180 tablet 0  . losartan (COZAAR) 100 MG tablet Take 100 mg by mouth daily.    Marland Kitchen omega-3 acid ethyl esters (LOVAZA) 1 g capsule Take 1 capsule (1 g total) by mouth 2 (two) times daily. 180 capsule 3  . travoprost, benzalkonium, (TRAVATAN) 0.004 % ophthalmic solution 1 drop at bedtime.     No current facility-administered medications for this visit.    Allergies as of 08/10/2020 - Review Complete 08/10/2020  Allergen Reaction Noted  . Biaxin [clarithromycin] Shortness Of Breath 03/25/2015  . Lidocaine Nausea Only 06/11/2018    Vitals: BP (!) 147/104   Pulse 80   Ht 5\' 5"  (1.651 m)   Wt 228 lb (103.4 kg)   BMI 37.94 kg/m  Last Weight:  Wt Readings from Last 1 Encounters:  08/10/20 228 lb (103.4 kg)   HBZ:JIRC mass index is 37.94 kg/m.     Last Height:   Ht Readings from Last 1 Encounters:  08/10/20 5\' 5"  (1.651 m)    Physical exam:  General: The patient is awake, alert and appears not in acute distress. The patient is well groomed, cooperative and pleasant. . Neck is short, thick.  Mallampati 3,  neck circumference: 20" . Nasal airflow unrestricted.  Retrognathia is  seen. Cardiovascular:  Regular rate and rhythm, without  murmurs or carotid bruit, and without distended neck veins. Respiratory: Lungs are clear to auscultation.  Skin: Without evidence of edema, or rash.  Trunk: BMI was 36 kg/m2. The patient's posture is erect.  Neurologic exam :The patient is awake and alert, oriented to place and time.  Attention span & concentration ability appears normal.  Speech is fluent,  without dysarthria, dysphonia or aphasia.  Mood and affect are appropriate.  Cranial nerves: Intact smell and taste sense. Pupils are equal and briskly reactive to light. Facial motor strength is symmetric, his  tongue and uvula move midline.  Shoulder shrug was symmetrical. Motor exam: Tone, muscle bulk and strength intact in all extremities.Deep tendon reflexes: in the  upper and lower extremities are symmetric and intact. Babinski maneuver response is downgoing. Gait and station intact.   Assessment:  After physical and neurologic examination, review of laboratory studies,  Personal review of imaging studies, reports of other /same  Imaging studies -Results of polysomnography/ neurophysiology testing  and pre-existing records as far as provided in visit., my assessment is:    1) Mr. Fahs has shown excellent compliance in the use of his new CPAP machine and has excellent compliance data . 100% .  He benefits from CPAP therapy, has no signs of fatigue or excessive daytime sleepiness.   [facility]- DME  Plan: continue with current settings and see you next year.       CC: FAA   Christopher [name], [address],  [place] [address]

## 2020-11-04 ENCOUNTER — Other Ambulatory Visit: Payer: Self-pay | Admitting: Orthopaedic Surgery

## 2020-11-04 DIAGNOSIS — M47896 Other spondylosis, lumbar region: Secondary | ICD-10-CM

## 2020-11-19 ENCOUNTER — Other Ambulatory Visit: Payer: Self-pay

## 2020-11-19 ENCOUNTER — Ambulatory Visit
Admission: RE | Admit: 2020-11-19 | Discharge: 2020-11-19 | Disposition: A | Discharge status: Home / Self Care | Payer: Federal, State, Local not specified - PPO | Source: Ambulatory Visit | Attending: Orthopaedic Surgery | Admitting: Orthopaedic Surgery

## 2020-11-19 DIAGNOSIS — M47896 Other spondylosis, lumbar region: Secondary | ICD-10-CM

## 2020-11-19 IMAGING — MR MR LUMBAR SPINE W/O CM
4 of 5 series · 18 of 48 positions shown · non-contrast
Comparison: MRI lumbar spine dated [DATE].

CLINICAL DATA: Chronic low back and bilateral leg pain. No surgery.

EXAM:
MRI LUMBAR SPINE WITHOUT CONTRAST
TECHNIQUE: Multiplanar, multisequence MR imaging of the lumbar spine was
performed. No intravenous contrast was administered.

[Series 6: T2 · sagittal · 4.0mm · 0.73mm/px · 5 of 15 slices shown (1 of 2)]
[im 1/15]
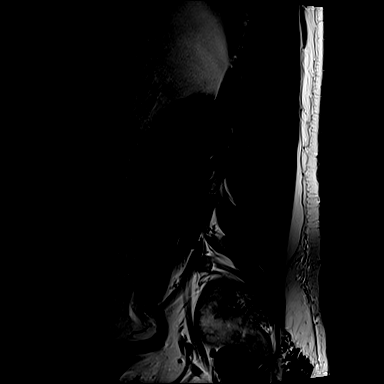
[im 4/15]
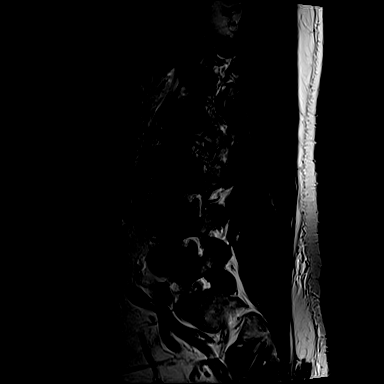
[im 8/15]
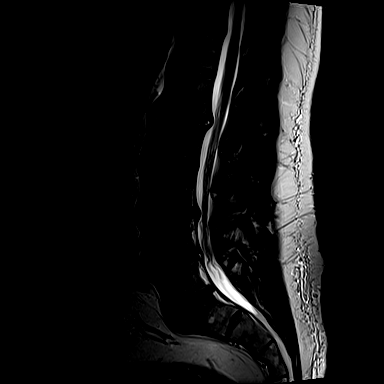
[im 11/15]
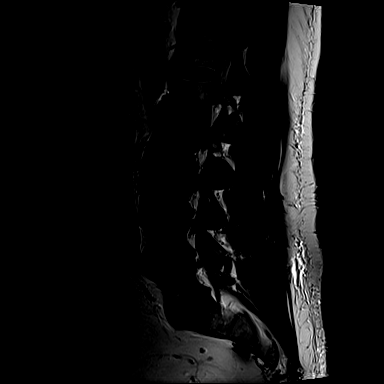
[im 15/15]
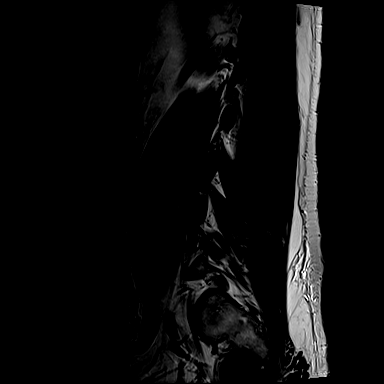

[Series 7: T1 · sagittal · 4.0mm · 0.73mm/px · 3 of 15 slices shown (1 of 2)]
[im 1/15]
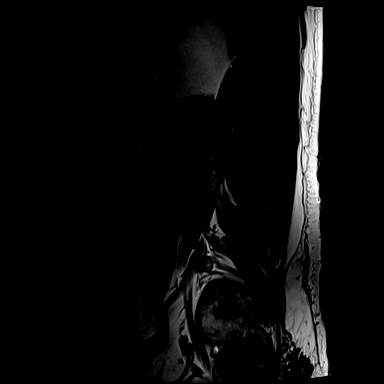
[im 10/15]
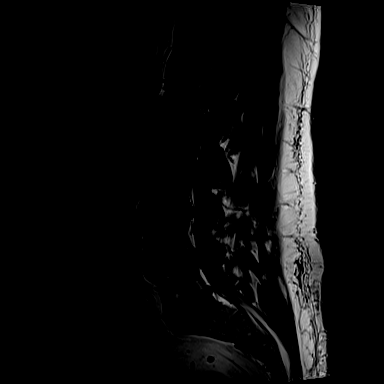
[im 15/15]
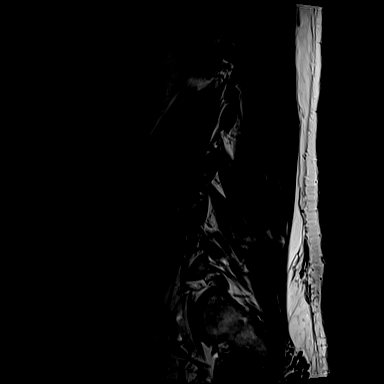

[Series 100: T1 · axial · 4.0mm · 0.28mm/px · z∈[+21,+190]mm · 3 of 41 slices shown (2 of 2)]
[im 8/41]
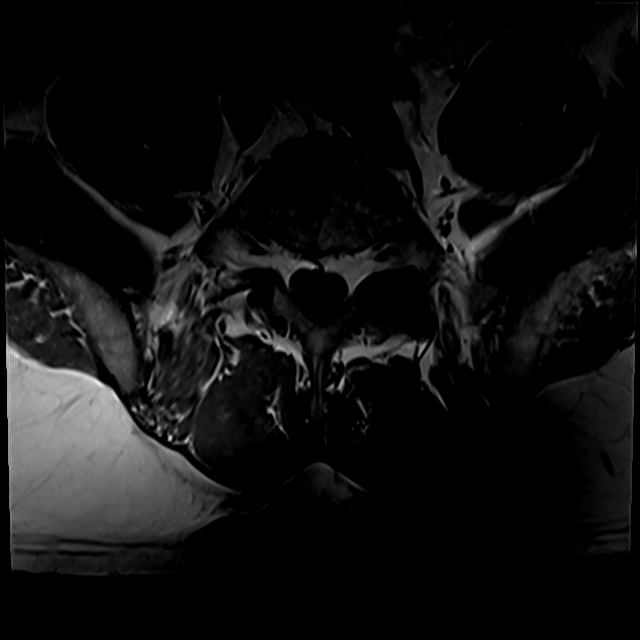
[im 22/41]
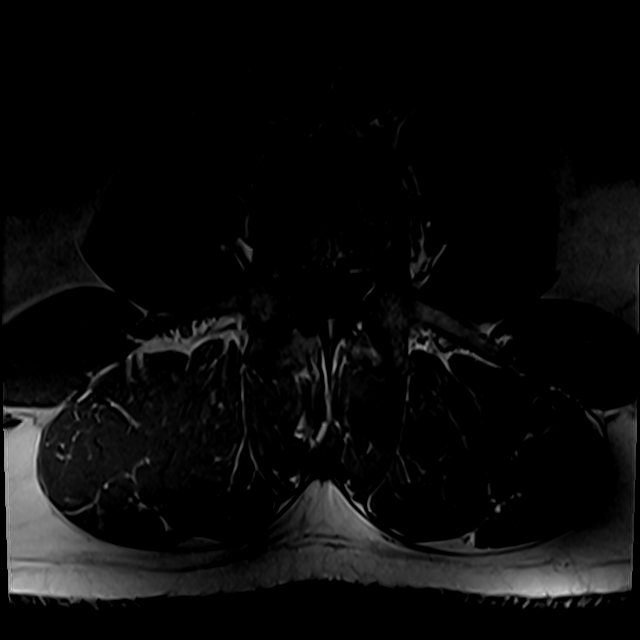
[im 37/41]
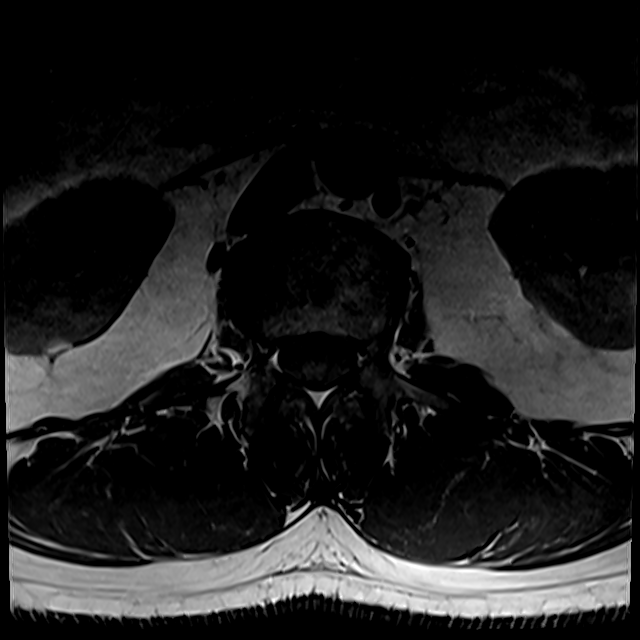

[Series 101: T2 · axial · 4.0mm · 0.28mm/px · z∈[-8,+180]mm · 7 of 82 slices shown (2 of 2)]
[im 4/82]
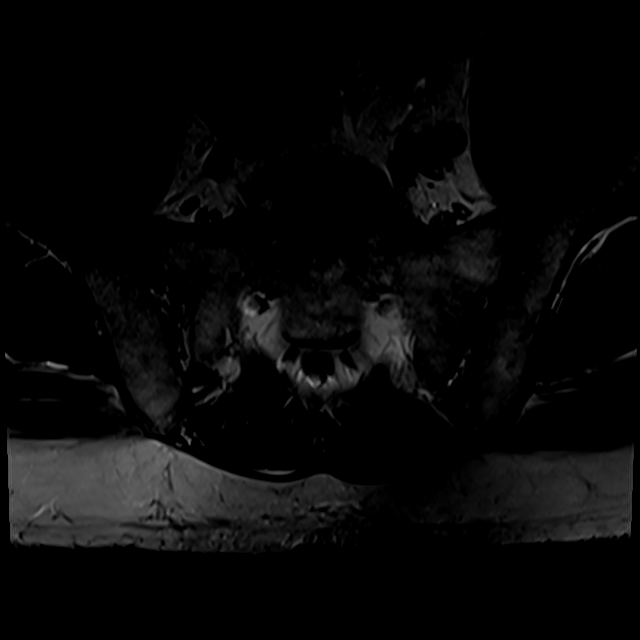
[im 12/82]
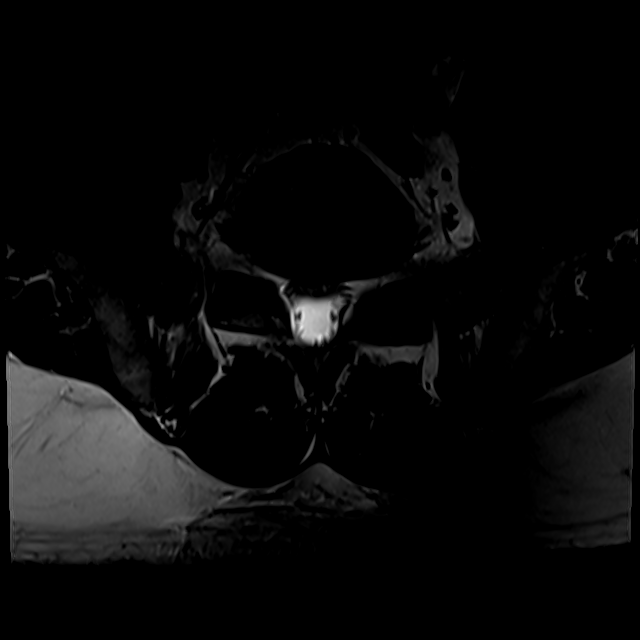
[im 15/82]
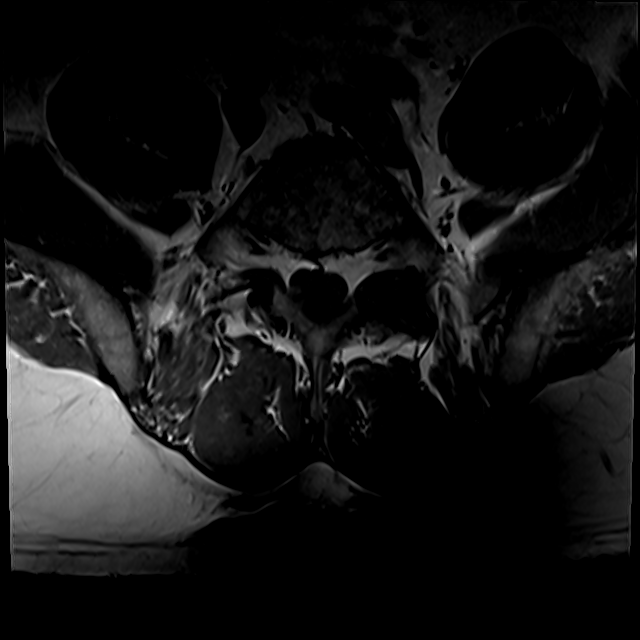
[im 26/82]
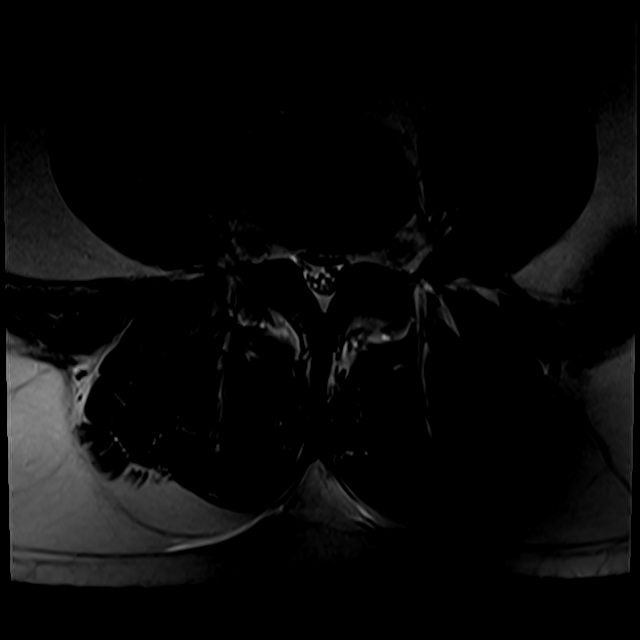
[im 37/82]
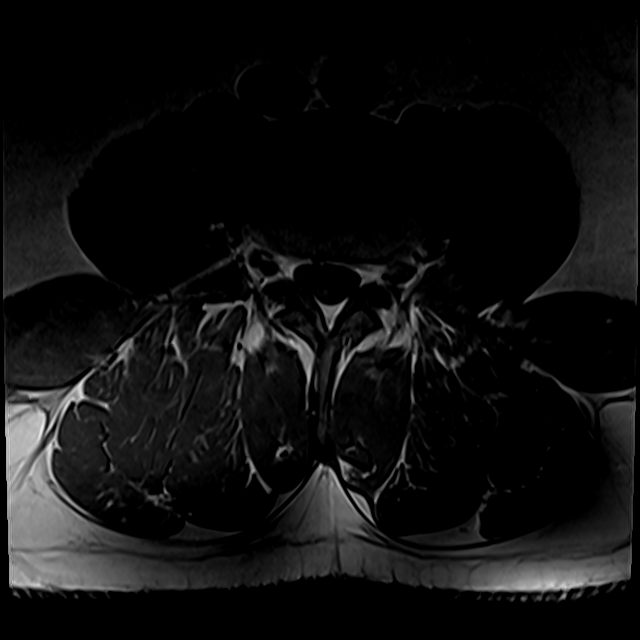
[im 41/82]
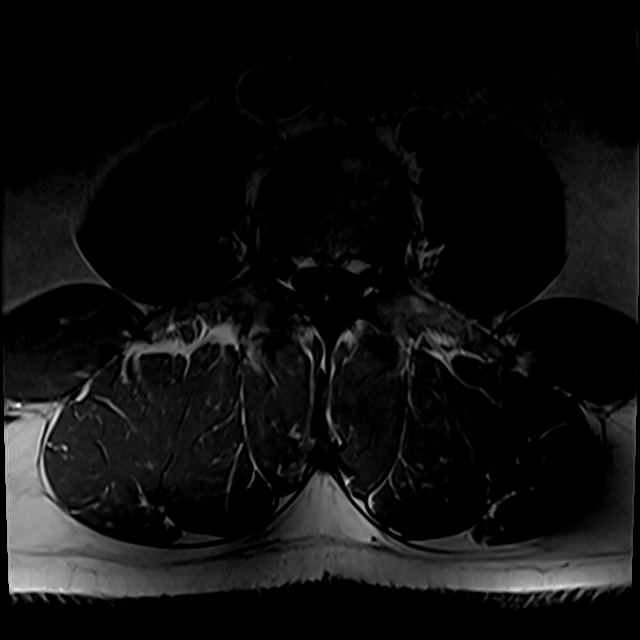
[im 70/82]
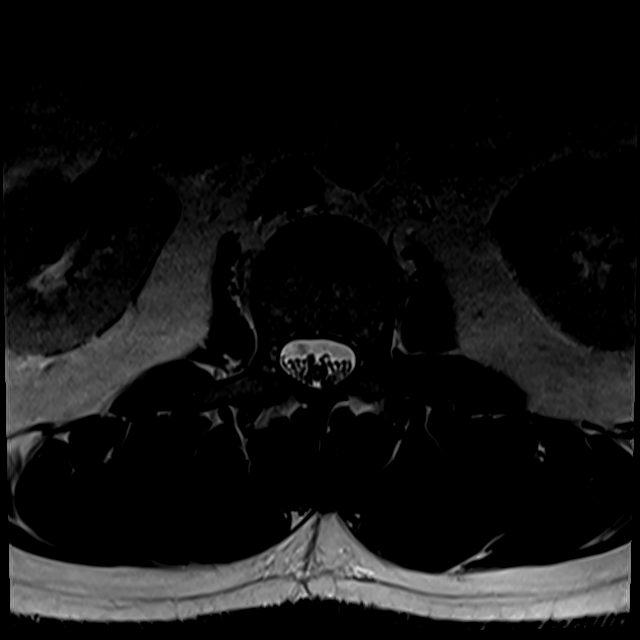

[18 of 48 positions shown; findings below may reference images not displayed]

FINDINGS: Segmentation:  Standard.

Alignment:  Unchanged trace retrolisthesis at L1-L2.

Vertebrae:  No fracture, evidence of discitis, or bone lesion.

Conus medullaris and cauda equina: Conus extends to the L2 level.
Conus and cauda equina appear normal.

Paraspinal and other soft tissues: Negative.

Disc levels:

T12-L1:  Negative.

L1-L2: Broad-based central disc protrusion is similar in size with
new annular fissure. Unchanged mild to moderate spinal canal
stenosis. No neuroforaminal stenosis.

L2-L3: Unchanged mild disc bulging eccentric to the left. No
stenosis.

L3-L4: Unchanged mild disc bulging with superimposed right
paracentral disc protrusion and annular fissure. Unchanged moderate
spinal canal stenosis and mild right lateral recess stenosis. No
neuroforaminal stenosis.

L4-L5: Unchanged mild disc bulging with superimposed small shallow
central disc protrusion and annular fissure. Unchanged mild
bilateral facet arthropathy. Unchanged mild to moderate spinal canal
and bilateral lateral recess stenosis. No neuroforaminal stenosis.

L5-S1:  Unchanged small central disc protrusion.  No stenosis.
IMPRESSION: 1. Multilevel degenerative changes of the lumbar spine as described
above, overall similar to prior study. Unchanged mild-to-moderate
spinal canal stenosis at L1-L2
2. Unchanged moderate spinal canal stenosis at L3-L4.
3. Unchanged mild-to-moderate spinal canal and bilateral lateral
recess stenosis at L4-L5.

## 2021-02-28 ENCOUNTER — Telehealth: Payer: Self-pay | Admitting: Neurology

## 2021-02-28 NOTE — Telephone Encounter (Signed)
Pt called, need CPAP download, CPAP read for the last 12 months. Employer lost the information. Would like a call from the nurse

## 2021-02-28 NOTE — Telephone Encounter (Signed)
Called and spoke with pt. He needs 12 month report for CPAP compliance to submit to employer. Advised we have access to resmed airview and can print this for him. He also needs OV notes, I printed this. He is going to bring by form for [place] to also fill out this week. Aware we will call him once everything is completed and ready for pick up. He verbalized understanding.

## 2021-03-02 ENCOUNTER — Telehealth: Payer: Self-pay | Admitting: Neurology

## 2021-03-02 NOTE — Telephone Encounter (Signed)
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  Forms needed to be completed by this week.  Call the patient to make him aware that the forms have been completed and signed by Dr. Brett Fairy.  Along with office notes and a recent CPAP download for a year.  There was no answer.  Left a voicemail advising the patient of this information and that paperwork can be placed upfront for pickup.  Advised the patient to call back I was missing any information.

## 2021-08-01 ENCOUNTER — Encounter: Payer: Self-pay | Admitting: Neurology

## 2021-08-01 ENCOUNTER — Ambulatory Visit: Payer: Federal, State, Local not specified - PPO | Admitting: Neurology

## 2021-08-01 VITALS — BP 134/88 | HR 74 | Ht 65.0 in | Wt 230.5 lb

## 2021-08-01 DIAGNOSIS — E669 Obesity, unspecified: Secondary | ICD-10-CM

## 2021-08-01 DIAGNOSIS — Z9989 Dependence on other enabling machines and devices: Secondary | ICD-10-CM | POA: Diagnosis not present

## 2021-08-01 DIAGNOSIS — G4733 Obstructive sleep apnea (adult) (pediatric): Secondary | ICD-10-CM | POA: Diagnosis not present

## 2021-08-01 DIAGNOSIS — Z0289 Encounter for other administrative examinations: Secondary | ICD-10-CM

## 2021-08-01 NOTE — Progress Notes (Addendum)
SLEEP MEDICINE CLINIC   Provider:  Larey Seat, MD   Referring Provider: Jolinda Croak, MD Primary Care Physician:  Zachery Dauer, MD  Chief Complaint  Patient presents with   Follow-up    RM 11. Last seen 08/10/20.     08-01-2021 .  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  Compliance report has been excellent at 98% for the last 3 and 65 days.  There was a very brief.  In March of last year with the machine was not used for 4 days while using an older CPAP -  The average user time was 8 hours 33 minutes, the minimum pressure for this Ultracet is set at 6 the maximum pressure at 16 cmH2O without expiratory pressure relief.  Residual AHI is 1.9 apneas or hypopneas per hour which is an excellent resolution.  The 95th percentile pressure is 15.3 cmH2O with an ear leakage at the 95th percentile of 7 L/min.  Based on these data I do not have to change mask, or setting also I will offer the patient an increase pressure to 17 cm water.   08-10-2020.  The patient has been as usual highly compliant CPAP user for insurance purposes a 30-day download was reviewed with 100% compliance by hours and days with an average use at time of 8 hours and 21 minutes.  The settings are between 6 cm minimum pressure of 16 cmH2O maximum pressure without expiratory pressure relief the residual AHI is 1.6 which is an excellent resolution there are no central apneas arising.  95th percentile pressure is 15.7 so we could theoretically increase the maximum pressure by 1 or 2 cm to allow a little bit more room this may also help to treat any additional breakthrough snoring.  His 365 day download confirms equally good compliance and control. I will forward this to his Veterinary surgeon physician.  100% the residual AHI was 2.0, same settings as quoted above average user time 8 hours 22  minutes.  95th percentile pressure was 15.2 over the year. He gained about 5 pounds in the pandemic.     He had  3 Covid 19  Shots - fully vaccinated.  He has received both parts of the Moderna messenger RNA-based Covid vaccine.  He needs for insurance reasons just to address initial CPAP with a new machine had just been issued.  The patient was 100% compliant CPAP user with an average use at time of 8 hours and 3 minutes, his AutoSet has a minimum setting of 6 maximum of 16 with 1 cm expiratory pressure relief his residual AHI is 2.5 there only some obstructive apneas still present no central apneas have emerged.  The 95th percentile pressure is 14.7 cmH2O which straddles close to the maximum setting allowed on his auto CPAP.  He denies any resurgence of daytime sleepiness the Epworth sleepiness score was endorsed at 0 out of 24 points.  The new machine has a serial #[id number].  Based on these data I have no problem of certifying Christopher [name] requirements and his insurance requirements for compliance.     07-28-2019- I Have the pleasure of meeting Christopher [name] today his compliance has been 100% for the 30-day download and 97% for the home the day download with an average user time of 8 hours 24 minutes, his CPAP is set  to 14 cmH2O pressure was 1 cm EPR his AHI is 1.6 there is minimal air leakage noted his Epworth Sleepiness Scale was endorsed at 0 points. He will actually start with his new machine tonight.  He will need to provide [place] the serial number for the new machine.   He was last seen here in a RV on 06-10-2018. He is here for CPAP compliance.  The patient has is usually been 100% compliant and this has been his pattern for many years now he is using an air sense 10 CPAP with a set pressure of 14 cmH2O 1 cm EPR and has a residual apnea count of 1/h.  Excellent control average user time 8 hours 9 minutes.  100% compliance by days and hours Epworth sleepiness score is endorsed at  0 Dr. Raymond [name] originally referred for a sleep evaluation for FAA compliance,  07-19-2017, I have a meanwhile 56 year old Caucasian gentleman who sees me yearly for his CPAP compliance documentation for the [facility].   I have the pleasure of looking back on 30 and 3 at 65 days of CPAP use the patient has been 100% compliant his residual AHI varies between 1.4 and 1.7/h CPAP is set at 14 cmH2O was 1 cm EPR in his average user time for the last 365 days has been 8 hours and 22 minutes.  He endorsed the Epworth Sleepiness Scale at 0 points, fatigue severity at 9 points both the lowest possible score.  He has noticed that there seems to be an internal air leak a little whistling sound that the machine admits when he exhales.  I have 2 options to bridge him over until he is due for new machine 1 is to increase the expiratory pressure release in order to ease the expiratory pressure on the machine as well and my second option would be to declare the machine nonfunctional and in that case the patient will get a new machine anyway., following up on CPAP compliance.  The patient has used the machine 30 out of 30 days with 100% compliance 4 hours.  Average use is 8 hours and 24 minutes, CPAP is set at 14 cmH2O was 1 cm EPR, residual AHI is 1.1.  There are minor air leaks noted.  This machine is an air sense 10 CPAP that can also be used as and autotitrator and should be able to give data about obstructive or central apneas when necessary. The patient reports that he can hardly sleep without his CPAP, he is very much used to using one.  He endorsed an Epworth score at 0 points fatigue severity at 9 points.  Also endorsed at the lowest possible level.  The needs to be no adjustment made, I will follow the patient yearly.  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    Dr. 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 The patient provided me with several copies of previous sleep studies. The patient also owns 2 machines- one of the normal longer downloadable through our software. [name]. presented with a sleep study from [place] [facility]. At the time in 8630 he was 56 year old gentleman referred for possible sleep disordered breathing he reached an AHI of 88 with an oxygen nadir of 64%, had loud snoring, no PLMS were noted. He was titrated  to 15 cm water pressure he used a nasal interface. This is a titration his AHI was reduced to 4.4 an improvement of over 90%. BiPAP was not attempted. On 15 cm the AHI was 0.8.  He then underwent a new titration on 11/12/2013 in [place]. He was re-titrated to CPAP this time he had frequent periodic limb movements for an index of 20.4 but very few arousals. He was titrated to 14 cm water pressure but his AHI was 3.5. I would venture to say that he was under titrated.  The patient received a new  CPAP which he has used preferable since. He has used a new machine 329 out of 365 days which is at 90% compliance he has 100% compliance for the last 30 days his AHI is 1.7 on 14 cm water was 1 cm EPR. Average user time is 8 hours and 5 minutes. Serial number is [id number]. The patient endorsed today the Epworth Sleepiness Scale at 0 points. The patient is using CPAP now for almost a decade and is highly compliant.  Sleep habits are as follows: He goes to bed at 9:30 PM and usually falls asleep promptly. The bedroom is cool quiet and dark, he prefers to sleep on his side on one pillow. He rises at 5:30 with an alarm. Total sleep time is estimated to exceed 7-1/2 hours- more likely 8 hours. He feels refreshed and rested. Medical history ; HTN, obesity, high cholesterol. Social history: 3 stepdaughters, 1 biological.   Interval health history from 07/24/2016. I have the pleasure of seeing Christopher [name] here  today who has been a highly compliant CPAP user. His CPAP compliance visit 100% for the last 30 days with an average of 8 hours and 21 minutes, his expiratory pressure relief is at 1 cm with 14 cm CPAP and his residual AHI is 1.6 he has only minor air leaks noted. The actual time used is 8 hours and 21 minutes per day the total used hours are 4559 hours since the machine was issued. The patient does not have diabetes, he has controlled hypertension with the help of only 1 medication, he does not have atrial fibrillation, no history of strokes, epilepsy or seizures, and no significant change in weight since last seen.   Review of Systems: Out of a complete 14 system review, the patient complains of only the following symptoms, and all other reviewed systems are negative. Snoring, weight gain-   How likely are you to doze in the following situations: 0 = not likely, 1 = slight chance, 2 = moderate chance, 3 = high chance  Sitting and Reading? Watching Television? Sitting inactive in a public place (theater or meeting)? Lying down in the afternoon when circumstances permit? Sitting and talking to someone? Sitting quietly after lunch without alcohol? In a car, while stopped for a few minutes in traffic? As a passenger in a car for an hour without a break?  Total =0. No snoring break- through. No nocturia.      all remain the same. Epworth score  0 , Fatigue severity score 9  , depression score 0   Social History   Socioeconomic History   Marital status: Married    Spouse name: Not on file   Number of children: 4   Years of education: Not on file   Highest education level: Not on file  Occupational History   Occupation: Engineer, production  Tobacco Use   Smoking status: Never  Smokeless tobacco: Never  Vaping Use   Vaping Use: Never used  Substance and Sexual Activity   Alcohol use: No    Alcohol/week: 0.0 standard drinks   Drug use: No   Sexual activity: Yes  Other Topics  Concern   Not on file  Social History Narrative   Not on file   Social Determinants of Health   Financial Resource Strain: Not on file  Food Insecurity: Not on file  Transportation Needs: Not on file  Physical Activity: Not on file  Stress: Not on file  Social Connections: Not on file  Intimate Partner Violence: Not on file    Family History  Problem Relation Age of Onset   Hyperlipidemia Father    Cancer Maternal Grandfather    Diabetes Paternal Grandmother    Heart disease Paternal Grandmother    Colon cancer Neg Hx    Esophageal cancer Neg Hx    Rectal cancer Neg Hx    Stomach cancer Neg Hx     Past Medical History:  Diagnosis Date   Allergy    Back pain    Glaucoma    Hypertension    MRSA carrier    Sleep apnea    On CPAP    Past Surgical History:  Procedure Laterality Date   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     aspirin 81 MG tablet Take 81 mg by mouth daily.     clotrimazole-betamethasone (LOTRISONE) cream Apply 1 application topically 2 (two) times daily. 30 g 0   cyclobenzaprine (FLEXERIL) 5 MG tablet Take 1 tablet (5 mg total) by mouth 3 (three) times daily. (Patient taking differently: Take 5 mg by mouth as needed.) 270 tablet 1   ibuprofen (ADVIL,MOTRIN) 800 MG tablet Take 1 tablet (800 mg total) by mouth every 8 (eight) hours as needed. 180 tablet 0   losartan (COZAAR) 100 MG tablet Take 100 mg by mouth daily.     omega-3 acid ethyl esters (LOVAZA) 1 g capsule Take 1 capsule (1 g total) by mouth 2 (two) times daily. 180 capsule 3   travoprost, benzalkonium, (TRAVATAN) 0.004 % ophthalmic solution 1 drop at bedtime.     No current facility-administered medications for this visit.    Allergies as of 08/01/2021 - Review Complete 08/01/2021  Allergen  Reaction Noted   Biaxin [clarithromycin] Shortness Of Breath 03/25/2015   Lidocaine Nausea Only 06/11/2018    Vitals: BP 134/88 (BP Location: Left Arm, Patient Position: Sitting, Cuff Size: Normal)    Pulse 74    Ht '5\' 5"'$  (1.651 m)    Wt 230 lb 8 oz (104.6 kg)    SpO2 97%    BMI 38.36 kg/m  Last Weight:    Wt Readings from Last 1 Encounters:  08/01/21 230 lb 8 oz (104.6 kg)   HER:DEYC mass index is 38.36 kg/m.        Last Height:   Ht Readings from Last 1 Encounters:  08/01/21 '5\' 5"'$  (1.651 m)    Physical exam:  General: The patient is awake, alert and appears not in acute distress. The patient is well groomed, cooperative and pleasant. . Neck is short, thick.- Mallampati 3,  neck circumference: 20" .  Nasal airflow unrestricted.  Retrognathia  is seen. Cardiovascular:  Regular rate and rhythm, without  murmurs or carotid bruit, and without distended neck veins. Respiratory: Lungs are clear to auscultation.  Skin: Without evidence of edema, or rash.  Trunk: BMI was 36 kg/m2. The patient's posture is erect.  Neurologic exam :The patient is awake and alert, oriented to place and time.  Attention span & concentration ability are normal.  Speech is fluent,  without dysarthria, dysphonia or aphasia.  Mood and affect are appropriate.  Cranial nerves: Intact smell and taste sense. Pupils are equal and briskly reactive to light. Facial motor strength is symmetric, his  tongue and uvula move midline.  Shoulder shrug was symmetrical.  Motor exam: Tone, muscle bulk and strength intact in all extremities. Deep tendon reflexes: in the upper and lower extremities are symmetric and intact. Babinski maneuver response is downgoing. Gait and station intact.   Assessment:  After physical and neurologic examination, review of laboratory studies,  Personal review of imaging studies, reports of other /same  Imaging studies -Results of polysomnography/ neurophysiology testing and pre-existing  records as far as provided in visit., my assessment is:    1) Christopher [name] has shown excellent compliance in the use of his new CPAP machine and has excellent compliance data over 365 days, at 100% .  He reports benefits from CPAP therapy, has no signs of excessive fatigue or excessive daytime sleepiness.  Epworth score was 0/ 24 points.  [facility]- DME  Plan: continue with current settings and see you next year.  RV once a year , in March 2024.       CC: [place], [place], [place],  [place] [address]

## 2021-08-01 NOTE — Patient Instructions (Signed)
Letter to the [place], ?  ?August 01, 2021  ? ? ?To whom it may concern, ? ?Mr. Eye is a patient under my sleep care at [facility] in [place] sleep.   ?He is an obstructive sleep apnea patient who is highly CPAP compliant at 100% over the last 365 days, and his medical treatment compliance has assured that his medical conditions have been stable.  ? ?The patient does not report residual daytime sleepiness and he continues to be alert, refreshed and restored after nocturnal sleep.  He should not be limited in his performance of duty and his is allowed to fly unrestricted.  ? ? ?Sincerely, Larey Seat MD. ?Diplomat of the American Academy of Sleep Medicine,  ?Diplomat of the [employer] of Neurology,  ?Boarded by the ABPM  And ABSM.   ?Medical Director of [employer]. ?

## 2021-08-24 ENCOUNTER — Other Ambulatory Visit: Payer: Self-pay | Admitting: Orthopaedic Surgery

## 2021-08-24 DIAGNOSIS — M48062 Spinal stenosis, lumbar region with neurogenic claudication: Secondary | ICD-10-CM

## 2021-08-27 ENCOUNTER — Ambulatory Visit
Admission: RE | Admit: 2021-08-27 | Discharge: 2021-08-27 | Disposition: A | Discharge status: Home / Self Care | Payer: Federal, State, Local not specified - PPO | Source: Ambulatory Visit | Attending: Orthopaedic Surgery | Admitting: Orthopaedic Surgery

## 2021-08-27 DIAGNOSIS — M48062 Spinal stenosis, lumbar region with neurogenic claudication: Secondary | ICD-10-CM

## 2021-08-27 IMAGING — MR MR LUMBAR SPINE WO/W CM
4 of 7 series · 23 of 48 positions shown · IV contrast (multihance)
Comparison: MRI lumbar spine [DATE], radiograph [DATE]

CLINICAL DATA: Pseudoclaudication syndrome, lower back pain

EXAM:
MRI LUMBAR SPINE WITHOUT AND WITH CONTRAST
TECHNIQUE: Multiplanar and multiecho pulse sequences of the lumbar spine were
obtained without and with intravenous contrast.
CONTRAST:  20mL MULTIHANCE GADOBENATE DIMEGLUMINE 529 MG/ML IV SOLN

[Series 3: T1 · sagittal · 4.0mm · 0.88mm/px · 3 of 15 slices shown (1 of 2)]
[im 1/15]
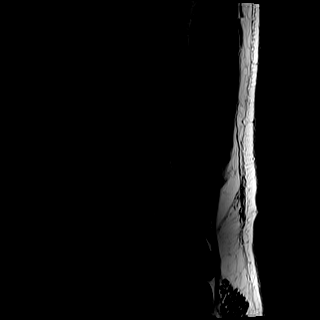
[im 8/15]
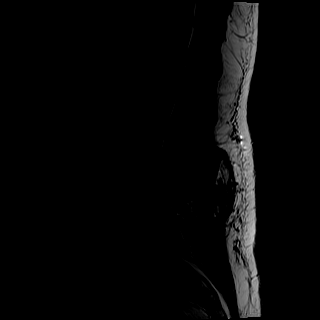
[im 15/15]
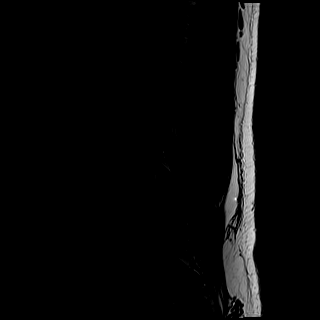

[Series 5: T2 · axial · 4.0mm · 0.39mm/px · z∈[-77,+155]mm · 11 of 42 slices shown (1 of 2)]
[im 1/42]
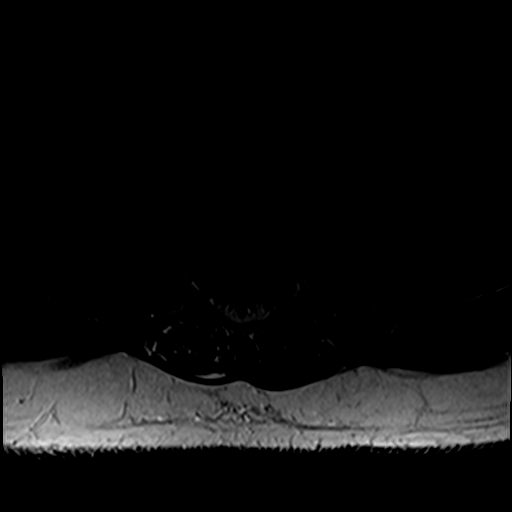
[im 5/42]
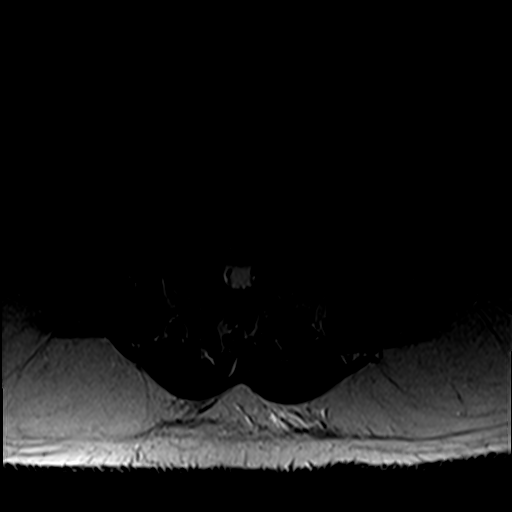
[im 9/42]
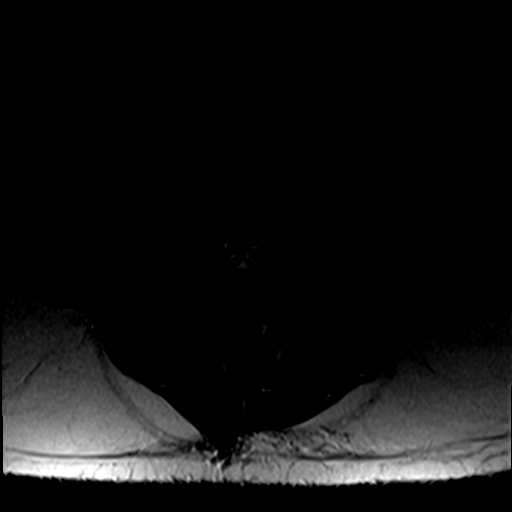
[im 13/42]
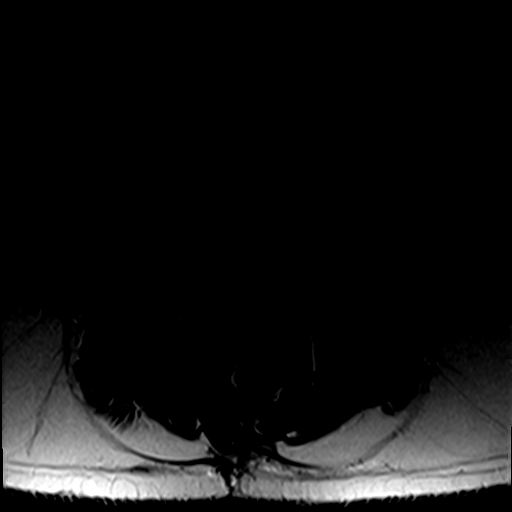
[im 17/42]
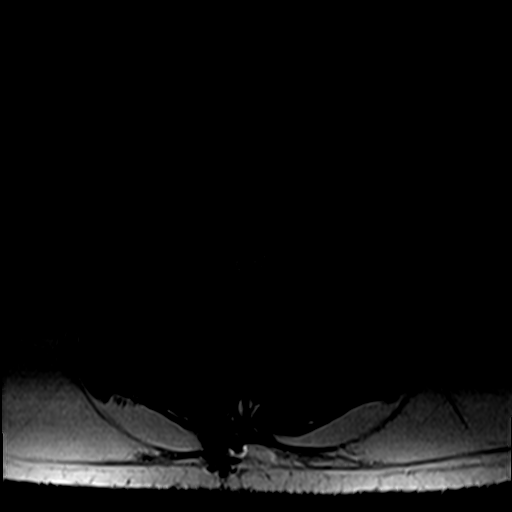
[im 21/42]
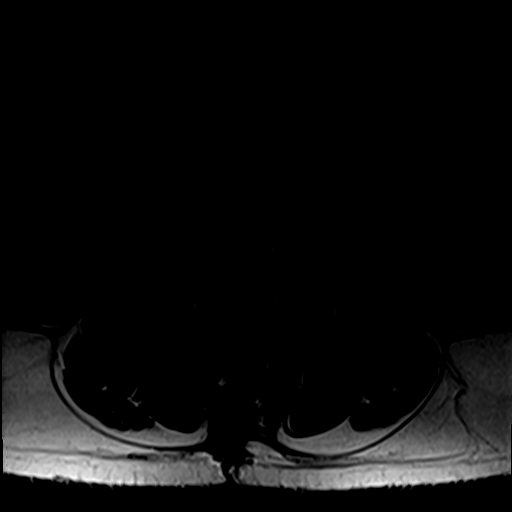
[im 25/42]
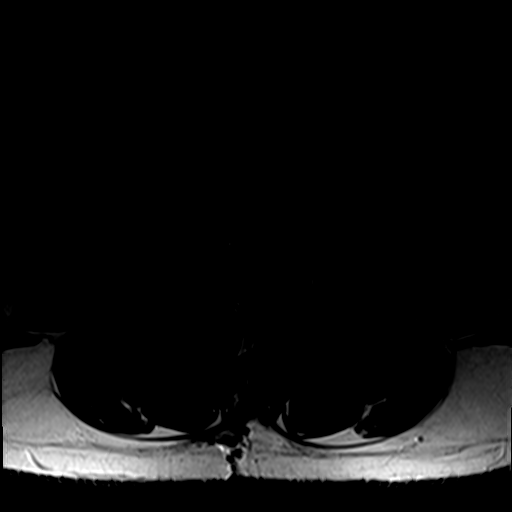
[im 29/42]
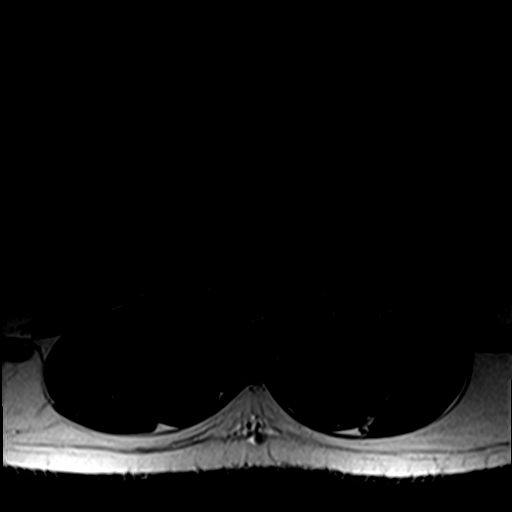
[im 33/42]
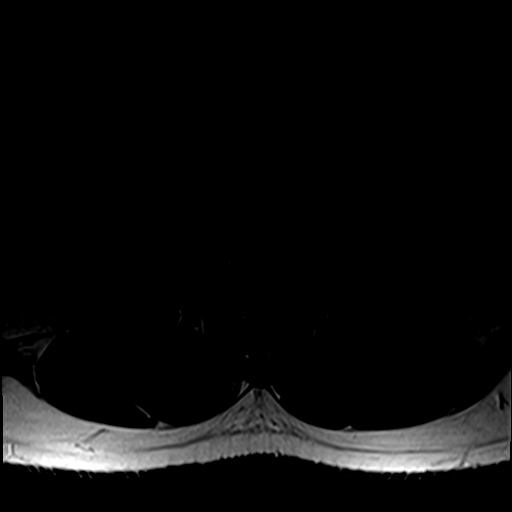
[im 37/42]
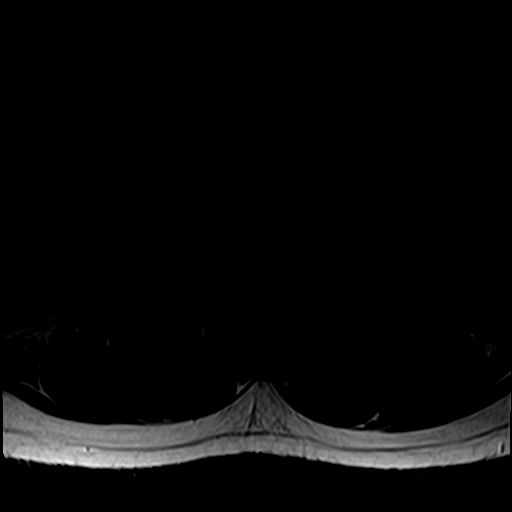
[im 42/42]
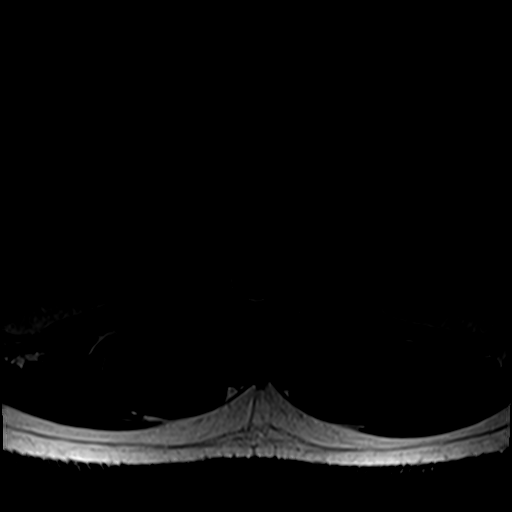

[Series 6: T1 · axial · 4.0mm · 0.39mm/px · z∈[-77,+131]mm · 5 of 42 slices shown (2 of 2)]
[im 1/42]
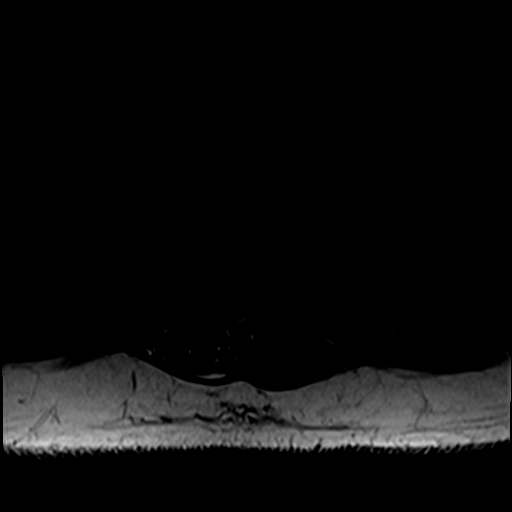
[im 5/42]
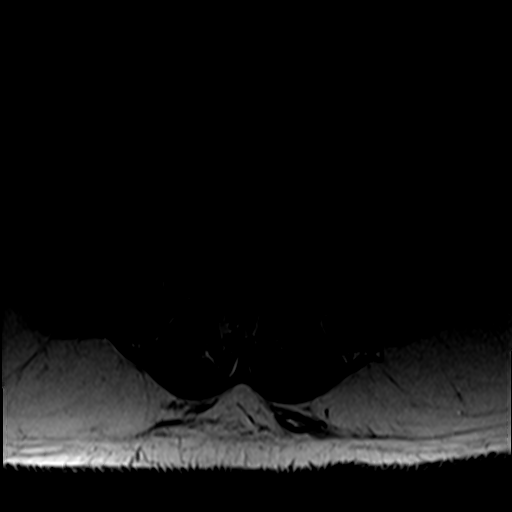
[im 9/42]
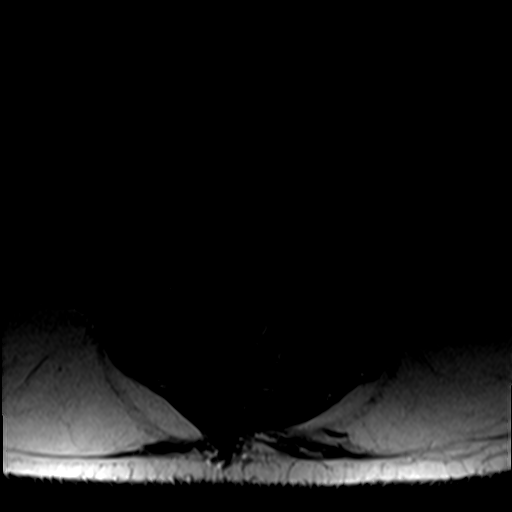
[im 21/42]
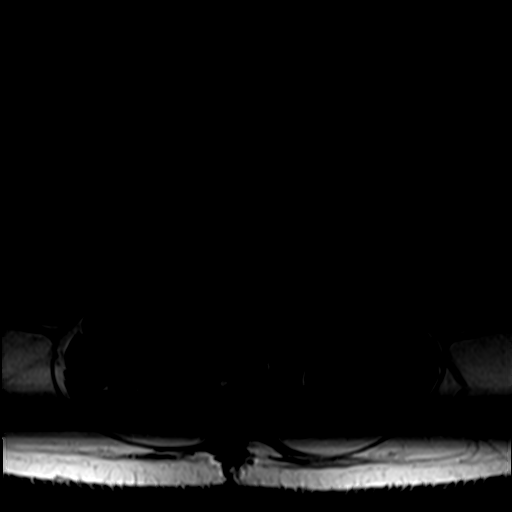
[im 37/42]
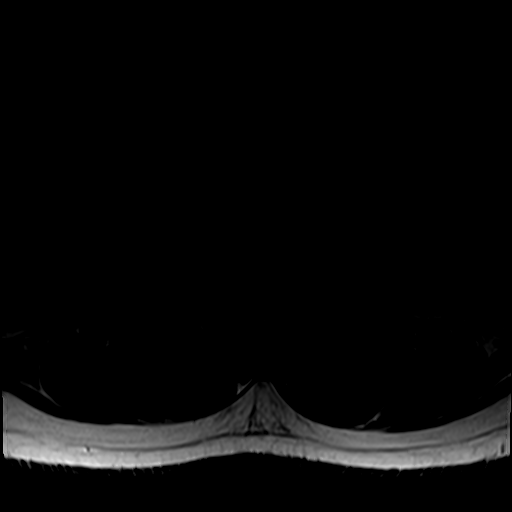

[Series 7: T2 · sagittal · 4.0mm · 1.09mm/px · 4 of 15 slices shown (2 of 2)]
[im 1/15]
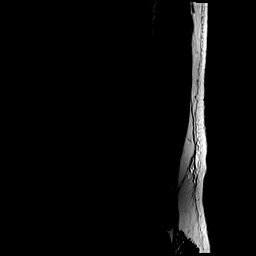
[im 5/15]
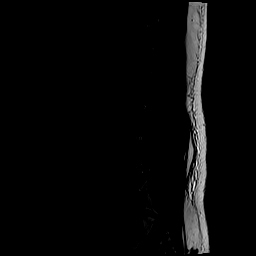
[im 10/15]
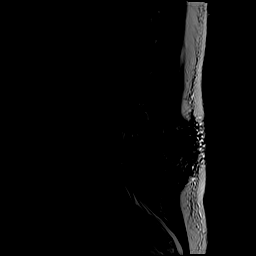
[im 15/15]
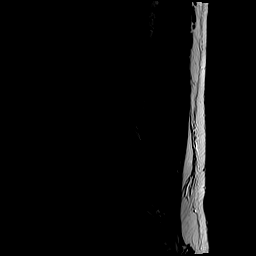

[23 of 48 positions shown; findings below may reference images not displayed]

FINDINGS: Segmentation:  Standard.

Alignment:  Physiologic.

Vertebrae: No fracture, evidence of discitis, or aggressive bone
lesion.

Conus medullaris and cauda equina: Conus extends to the L1-L2 level.
Conus and cauda equina appear normal.

Paraspinal and other soft tissues: Postsurgical changes in the
posterior lower paraspinal soft tissues. No focal fluid collection.

Disc levels:

T11-T12: Minimal central disc bulging.  No significant stenosis.

T12-L1: No significant stenosis.

L1-L2: Central disc protrusion unchanged from prior exam with small
annular fissure. Unchanged mild to moderate spinal canal stenosis.
No neural foraminal stenosis.

L2-L3: Unchanged mild eccentric left disc bulging with ligamentum
flavum hypertrophy and mild facet arthropathy. No significant
stenosis.

L3-L4: Broad-based disc bulging with superimposed right paracentral
disc protrusion and left sided extraforaminal disc bulging. Prior
posterior decompression. Improved patency of the central spinal
canal with persistent lateral recess stenosis (axial T2 image 25).
No neural foraminal stenosis. Adjacent postsurgical changes
posteriorly.

L4-L5: Prior posterior decompression. Minimal residual disc bulging
with shallow central disc protrusion and annular fissure. Mild
bilateral facet arthropathy. There is persistent mild-to-moderate
spinal canal stenosis and bilateral lateral recess stenosis (axial
T2 image 31). Adjacent postsurgical changes posteriorly.

L5-S1: Mild broad-based disc bulging and bilateral facet
arthropathy. No significant stenosis.
IMPRESSION: Postsurgical changes of posterior decompression at L3-L4 and L4-L5.

Improved patency of the central canal at L3-L4 with persistent
bilateral lateral recess stenosis and potential for impingement of
the descending nerve roots bilaterally.

Persistent spinal canal and bilateral lateral recess stenosis at
L4-L5 with potential for impingement of the descending nerve roots
bilaterally.

## 2021-08-27 MED ORDER — GADOBENATE DIMEGLUMINE 529 MG/ML IV SOLN
20.0000 mL | Freq: Once | INTRAVENOUS | Status: AC | PRN
  Administered 2021-08-27: 20 mL via INTRAVENOUS

## 2022-08-07 ENCOUNTER — Ambulatory Visit: Payer: Federal, State, Local not specified - PPO | Admitting: Neurology

## 2022-09-04 ENCOUNTER — Encounter: Payer: Self-pay | Admitting: Neurology

## 2022-09-04 ENCOUNTER — Ambulatory Visit: Payer: Federal, State, Local not specified - PPO | Admitting: Neurology

## 2022-12-15 ENCOUNTER — Encounter: Payer: Self-pay | Admitting: Thoracic Surgery (Cardiothoracic Vascular Surgery)

## 2022-12-15 ENCOUNTER — Other Ambulatory Visit: Payer: Self-pay | Admitting: *Deleted

## 2022-12-15 ENCOUNTER — Encounter: Payer: Self-pay | Admitting: *Deleted

## 2022-12-15 ENCOUNTER — Institutional Professional Consult (permissible substitution) (INDEPENDENT_AMBULATORY_CARE_PROVIDER_SITE_OTHER): Payer: No Typology Code available for payment source | Admitting: Thoracic Surgery (Cardiothoracic Vascular Surgery)

## 2022-12-15 VITALS — BP 117/85 | HR 66 | Resp 20 | Ht 65.0 in | Wt 225.2 lb

## 2022-12-15 DIAGNOSIS — I2510 Atherosclerotic heart disease of native coronary artery without angina pectoris: Secondary | ICD-10-CM | POA: Insufficient documentation

## 2022-12-15 DIAGNOSIS — I25119 Atherosclerotic heart disease of native coronary artery with unspecified angina pectoris: Secondary | ICD-10-CM

## 2022-12-15 MED ORDER — NITROGLYCERIN 0.3 MG SL SUBL: 0.3000 mg | SUBLINGUAL_TABLET | SUBLINGUAL | 0 refills | Status: AC | PRN

## 2022-12-15 NOTE — Progress Notes (Unsigned)
[address]       [place] [address]             [phone]        Dawan Farney [facility] Medical Record #[id number] Date of Birth: [birth year]  Referring: Greta Doom, MD Primary Care: Greta Doom, MD Primary Cardiologist:None  Chief Complaint:    Chief Complaint  Patient presents with  . Coronary Artery Disease    New patient consultation CATH 7/3, ECHO 5/9    History of Present Illness:     Christopher [name] 57 y.o. 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  He was evaluated for exertional anginal symptoms.  He denies any rest pain, or orthopnea   Past Medical and Surgical History: Previous Chest Surgery: no Previous Chest Radiation: *** Diabetes Mellitus: ***.  HbA1C *** Creatinine:  Lab Results  Component Value Date   CREATININE 1.01 10/31/2017   CREATININE 0.96 09/10/2017   CREATININE 0.94 05/12/2016     Past Medical History:  Diagnosis Date  . Allergy   . Back pain   . Glaucoma   . Hypertension   . MRSA carrier   . Sleep apnea    On CPAP    Past Surgical History:  Procedure Laterality Date  . 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 CERVICAL FUSION    . COLONOSCOPY    . TONSILLECTOMY AND ADENOIDECTOMY      Social History:  Social History   Tobacco Use  Smoking Status Never  Smokeless Tobacco Never    Social History   Substance and Sexual Activity  Alcohol Use No  . Alcohol/week: 0.0 standard drinks of alcohol     Allergies  Allergen Reactions  . Biaxin [Clarithromycin] Shortness Of Breath  . Lidocaine Nausea Only    Medications: Asprin: *** Statin: *** Beta Blocker: *** Ace Inhibitor: *** Anti-Coagulation: ***  Current Outpatient Medications  Medication Sig Dispense Refill  . amLODipine (NORVASC) 5 MG tablet Take 5 mg by mouth daily.    Marland Kitchen aspirin 81 MG tablet Take 81 mg by mouth daily.    . clotrimazole-betamethasone (LOTRISONE) cream Apply 1 application topically 2 (two) times daily. 30  g 0  . cyclobenzaprine (FLEXERIL) 5 MG tablet Take 1 tablet (5 mg total) by mouth 3 (three) times daily. (Patient taking differently: Take 5 mg by mouth as needed.) 270 tablet 1  . losartan (COZAAR) 100 MG tablet Take 100 mg by mouth daily.    . nitroGLYCERIN (NITROSTAT) 0.3 MG SL tablet Place 1 tablet (0.3 mg total) under the tongue every 5 (five) minutes as needed for chest pain. 90 tablet 0  . omega-3 acid ethyl esters (LOVAZA) 1 g capsule Take 1 capsule (1 g total) by mouth 2 (two) times daily. 180 capsule 3  . travoprost, benzalkonium, (TRAVATAN) 0.004 % ophthalmic solution 1 drop at bedtime.     No current facility-administered medications for this visit.    (Not in a hospital admission)   Family History  Problem Relation Age of Onset  . Hyperlipidemia Father   . Cancer Maternal Grandfather   . Diabetes Paternal Grandmother   . Heart disease Paternal Grandmother   . Colon cancer Neg Hx   . Esophageal cancer Neg Hx   . Rectal cancer Neg Hx   . Stomach cancer Neg Hx      Review of Systems:   ROS    Physical Exam: BP 117/85 (BP Location: Left  Arm, Patient Position: Sitting, Cuff Size: Normal)   Pulse 66   Resp 20   Ht 5\' 5"  (1.651 m)   Wt 225 lb 3.2 oz (102.2 kg)   SpO2 98% Comment: RA  BMI 37.48 kg/m  Physical Exam    Diagnostic Studies & Laboratory data:    Left Heart Catherization: *** Echo: ***   EKG: *** I have independently reviewed the above radiologic studies and discussed with the patient   Recent Lab Findings: Lab Results  Component Value Date   WBC 7.2 10/31/2017   HGB 16.0 10/31/2017   HCT 46.5 10/31/2017   PLT 319 10/31/2017   GLUCOSE 95 10/31/2017   CHOL 214 (H) 10/31/2017   TRIG 165 (H) 10/31/2017   HDL 38 (L) 10/31/2017   LDLCALC 143 (H) 10/31/2017   ALT 92 (H) 05/12/2016   AST 40 05/12/2016   NA 140 10/31/2017   K 4.3 10/31/2017   CL 103 10/31/2017   CREATININE 1.01 10/31/2017   BUN 12 10/31/2017   CO2 22 10/31/2017   HGBA1C  5.5 10/31/2017      Assessment / Plan:   ***     I  spent {CHL ONC TIME VISIT - EXBMW:[id number]} counseling the patient face to face.   Corliss Skains 12/15/2022 4:57 PM

## 2022-12-20 ENCOUNTER — Telehealth: Payer: Self-pay

## 2022-12-20 NOTE — Telephone Encounter (Signed)
FMLA form completed for [employer]. Beginning LOA 01/11/23 through 04/09/23./ DOS 01/11/23. Forms mailed to patient's  home address.

## 2022-12-22 ENCOUNTER — Encounter: Payer: Self-pay | Admitting: *Deleted

## 2022-12-22 ENCOUNTER — Other Ambulatory Visit (HOSPITAL_COMMUNITY): Payer: Self-pay

## 2023-01-08 NOTE — Pre-Procedure Instructions (Signed)
Surgical Instructions   Your procedure is scheduled on Thursday, August 15th. Report to [facility] Main Entrance "A" at 05:30 A.M., then check in with the Admitting office. Any questions or running late day of surgery: call [phone]  Questions prior to your surgery date: call [phone], Monday-Friday, 8am-4pm. If you experience any cold or flu symptoms such as cough, fever, chills, shortness of breath, etc. between now and your scheduled surgery, please notify Christopher [name] at the above number.     Remember:  Do not eat or drink after midnight the night before your surgery     Take these medicines the morning of surgery with A SIP OF WATER  amLODipine (NORVASC)  metoprolol succinate (TOPROL-XL)  Polyvinyl Alcohol-Povidone (REFRESH OP)  rosuvastatin (CRESTOR)    May take these medicines IF NEEDED: chlorzoxazone (PARAFON)  nitroGLYCERIN (NITROSTAT)- if you need to take this medication prior to surgery, please call one of the above phone numbers and report this to a nurse  One week prior to surgery, STOP taking any Aleve, Naproxen, Ibuprofen, Motrin, Advil, Goody's, BC's, all herbal medications, fish oil, meloxicam (MOBIC) and non-prescription vitamins.                     Do NOT Smoke (Tobacco/Vaping) for 24 hours prior to your procedure.  If you use a CPAP at night, you may bring your mask/headgear for your overnight stay.   You will be asked to remove any contacts, glasses, piercing's, hearing aid's, dentures/partials prior to surgery. Please bring cases for these items if needed.    Patients discharged the day of surgery will not be allowed to drive home, and someone needs to stay with them for 24 hours.  SURGICAL WAITING ROOM VISITATION Patients may have no more than 2 support people in the waiting area - these visitors may rotate.   Pre-op nurse will coordinate an appropriate time for 1 ADULT support person, who may not rotate, to accompany patient in pre-op.  Children under  the age of 50 must have an adult with them who is not the patient and must remain in the main waiting area with an adult.  If the patient needs to stay at the hospital during part of their recovery, the visitor guidelines for inpatient rooms apply.  Please refer to the [facility] website for the visitor guidelines for any additional information.   If you received a COVID test during your pre-op visit  it is requested that you wear a mask when out in public, stay away from anyone that may not be feeling well and notify your surgeon if you develop symptoms. If you have been in contact with anyone that has tested positive in the last 10 days please notify you surgeon.      Pre-operative CHG Bathing Instructions   You can play a key role in reducing the risk of infection after surgery. Your skin needs to be as free of germs as possible. You can reduce the number of germs on your skin by washing with CHG (chlorhexidine gluconate) soap before surgery. CHG is an antiseptic soap that kills germs and continues to kill germs even after washing.   DO NOT use if you have an allergy to chlorhexidine/CHG or antibacterial soaps. If your skin becomes reddened or irritated, stop using the CHG and notify one of our RNs at [phone].              TAKE A SHOWER THE NIGHT BEFORE SURGERY AND THE DAY OF  SURGERY    Please keep in mind the following:  DO NOT shave, including legs and underarms, 48 hours prior to surgery.   You may shave your face before/day of surgery.  Place clean sheets on your bed the night before surgery Use a clean washcloth (not used since being washed) for each shower. DO NOT sleep with pet's night before surgery.  CHG Shower Instructions:  If you choose to wash your hair and private area, wash first with your normal shampoo/soap.  After you use shampoo/soap, rinse your hair and body thoroughly to remove shampoo/soap residue.  Turn the water OFF and apply half the bottle of CHG soap  to a CLEAN washcloth.  Apply CHG soap ONLY FROM YOUR NECK DOWN TO YOUR TOES (washing for 3-5 minutes)  DO NOT use CHG soap on face, private areas, open wounds, or sores.  Pay special attention to the area where your surgery is being performed.  If you are having back surgery, having someone wash your back for you may be helpful. Wait 2 minutes after CHG soap is applied, then you may rinse off the CHG soap.  Pat dry with a clean towel  Put on clean pajamas    Additional instructions for the day of surgery: DO NOT APPLY any lotions, deodorants, cologne, or perfumes.   Do not wear jewelry or makeup Do not wear nail polish, gel polish, artificial nails, or any other type of covering on natural nails (fingers and toes) Do not bring valuables to the hospital. [facility] is not responsible for valuables/personal belongings. Put on clean/comfortable clothes.  Please brush your teeth.  Ask your nurse before applying any prescription medications to the skin.

## 2023-01-09 ENCOUNTER — Other Ambulatory Visit: Payer: Self-pay

## 2023-01-09 ENCOUNTER — Ambulatory Visit (HOSPITAL_COMMUNITY)
Admission: RE | Admit: 2023-01-09 | Discharge: 2023-01-09 | Disposition: A | Discharge status: Home / Self Care | Payer: No Typology Code available for payment source | Source: Ambulatory Visit | Attending: Thoracic Surgery (Cardiothoracic Vascular Surgery) | Admitting: Thoracic Surgery (Cardiothoracic Vascular Surgery)

## 2023-01-09 ENCOUNTER — Encounter (HOSPITAL_COMMUNITY)
Admission: RE | Admit: 2023-01-09 | Discharge: 2023-01-09 | Disposition: A | Discharge status: Home / Self Care | Payer: No Typology Code available for payment source | Source: Ambulatory Visit | Attending: Thoracic Surgery (Cardiothoracic Vascular Surgery) | Admitting: Thoracic Surgery (Cardiothoracic Vascular Surgery)

## 2023-01-09 ENCOUNTER — Encounter (HOSPITAL_COMMUNITY): Payer: Self-pay

## 2023-01-09 VITALS — BP 137/94 | HR 65 | Temp 98.2°F | Resp 19 | Ht 65.0 in | Wt 224.3 lb

## 2023-01-09 DIAGNOSIS — I10 Essential (primary) hypertension: Secondary | ICD-10-CM | POA: Diagnosis not present

## 2023-01-09 DIAGNOSIS — Z01818 Encounter for other preprocedural examination: Secondary | ICD-10-CM | POA: Insufficient documentation

## 2023-01-09 DIAGNOSIS — Z951 Presence of aortocoronary bypass graft: Secondary | ICD-10-CM | POA: Insufficient documentation

## 2023-01-09 DIAGNOSIS — I25119 Atherosclerotic heart disease of native coronary artery with unspecified angina pectoris: Secondary | ICD-10-CM | POA: Diagnosis not present

## 2023-01-09 DIAGNOSIS — Z1152 Encounter for screening for COVID-19: Secondary | ICD-10-CM | POA: Insufficient documentation

## 2023-01-09 DIAGNOSIS — E785 Hyperlipidemia, unspecified: Secondary | ICD-10-CM | POA: Insufficient documentation

## 2023-01-09 HISTORY — DX: Atherosclerotic heart disease of native coronary artery without angina pectoris: I25.10

## 2023-01-09 HISTORY — DX: Angina pectoris, unspecified: I20.9

## 2023-01-09 HISTORY — DX: Gastro-esophageal reflux disease without esophagitis: K21.9

## 2023-01-09 HISTORY — DX: Fatty (change of) liver, not elsewhere classified: K76.0

## 2023-01-09 LAB — URINALYSIS, ROUTINE W REFLEX MICROSCOPIC
Bilirubin Urine: NEGATIVE
Glucose, UA: NEGATIVE mg/dL
Hgb urine dipstick: NEGATIVE
Ketones, ur: NEGATIVE mg/dL
Leukocytes,Ua: NEGATIVE
Nitrite: NEGATIVE
Protein, ur: NEGATIVE mg/dL
Specific Gravity, Urine: 1.016 (ref 1.005–1.030)
pH: 6 (ref 5.0–8.0)

## 2023-01-09 LAB — COMPREHENSIVE METABOLIC PANEL
ALT: 61 U/L — ABNORMAL HIGH (ref 0–44)
AST: 50 U/L — ABNORMAL HIGH (ref 15–41)
Albumin: 4 g/dL (ref 3.5–5.0)
Alkaline Phosphatase: 64 U/L (ref 38–126)
Anion gap: 9 (ref 5–15)
BUN: 11 mg/dL (ref 6–20)
CO2: 22 mmol/L (ref 22–32)
Calcium: 9.4 mg/dL (ref 8.9–10.3)
Chloride: 107 mmol/L (ref 98–111)
Creatinine, Ser: 0.93 mg/dL (ref 0.61–1.24)
GFR, Estimated: >60 mL/min (ref >60)
Glucose, Bld: 90 mg/dL (ref 70–99)
Potassium: 3.9 mmol/L (ref 3.5–5.1)
Sodium: 138 mmol/L (ref 135–145)
Total Bilirubin: 0.6 mg/dL (ref 0.3–1.2)
Total Protein: 7 g/dL (ref 6.5–8.1)

## 2023-01-09 LAB — TYPE AND SCREEN
ABO/RH(D): B NEG
Antibody Screen: NEGATIVE

## 2023-01-09 LAB — BLOOD GAS, ARTERIAL
Acid-base deficit: 0.7 mmol/L (ref 0.0–2.0)
Bicarbonate: 23.4 mmol/L (ref 20.0–28.0)
Drawn by: 58793
O2 Saturation: 100 %
Patient temperature: 37
pCO2 arterial: 36 mmHg (ref 32–48)
pH, Arterial: 7.42 (ref 7.35–7.45)
pO2, Arterial: 129 mmHg — ABNORMAL HIGH (ref 83–108)

## 2023-01-09 LAB — SURGICAL PCR SCREEN
MRSA, PCR: NEGATIVE
Staphylococcus aureus: NEGATIVE

## 2023-01-09 LAB — CBC
HCT: 45.3 % (ref 39.0–52.0)
Hemoglobin: 15.4 g/dL (ref 13.0–17.0)
MCH: 29.4 pg (ref 26.0–34.0)
MCHC: 34 g/dL (ref 30.0–36.0)
MCV: 86.6 fL (ref 80.0–100.0)
Platelets: 281 10*3/uL (ref 150–400)
RBC: 5.23 MIL/uL (ref 4.22–5.81)
RDW: 12.5 % (ref 11.5–15.5)
WBC: 9.2 10*3/uL (ref 4.0–10.5)
nRBC: 0 % (ref 0.0–0.2)

## 2023-01-09 LAB — APTT: aPTT: 31 seconds (ref 24–36)

## 2023-01-09 LAB — PROTIME-INR
INR: 1.1 (ref 0.8–1.2)
Prothrombin Time: 14.3 seconds (ref 11.4–15.2)

## 2023-01-09 NOTE — Progress Notes (Signed)
PCP - Dr. Manuela Schwartz [facility]) Cardiologist - Dr. Dawna Part  PPM/ICD - denies   Chest x-ray - 01/09/23 EKG - 01/09/23 Stress Test - 10/05/22 ECHO - 10/05/22 Cardiac Cath - 11/29/22  Sleep Study - OSA+ CPAP - nightly, pressure settings around 15-16  DM- denies  Blood Thinner Instructions: n/a Aspirin Instructions: Hold DOS  ERAS Protcol - no, NPO   COVID TEST- 01/09/23   Anesthesia review: yes, cardiac hx  Patient denies shortness of breath, fever, cough and chest pain at PAT appointment   All instructions explained to the patient, with a verbal understanding of the material. Patient agrees to go over the instructions while at home for a better understanding. Patient also instructed to wear a mask in public after being tested for COVID-19. The opportunity to ask questions was provided.

## 2023-01-10 MED ORDER — CEFAZOLIN SODIUM-DEXTROSE 2-4 GM/100ML-% IV SOLN
2.0000 g | INTRAVENOUS | Status: AC
  Filled 2023-01-10: qty 100

## 2023-01-10 MED ORDER — PHENYLEPHRINE HCL-NACL 20-0.9 MG/250ML-% IV SOLN
30.0000 ug/min | INTRAVENOUS | Status: AC
  Filled 2023-01-10: qty 250

## 2023-01-10 MED ORDER — POTASSIUM CHLORIDE 2 MEQ/ML IV SOLN
80.0000 meq | INTRAVENOUS | Status: AC
  Filled 2023-01-10: qty 40

## 2023-01-10 MED ORDER — TRANEXAMIC ACID 1000 MG/10ML IV SOLN
1.5000 mg/kg/h | INTRAVENOUS | Status: AC
  Filled 2023-01-10: qty 25

## 2023-01-10 MED ORDER — MANNITOL 20 % IV SOLN
INTRAVENOUS | Status: AC
  Filled 2023-01-10: qty 13

## 2023-01-10 MED ORDER — HEPARIN 30,000 UNITS/1000 ML (OHS) CELLSAVER SOLUTION
Status: AC
  Filled 2023-01-10: qty 1000

## 2023-01-10 MED ORDER — MILRINONE LACTATE IN DEXTROSE 20-5 MG/100ML-% IV SOLN
0.3000 ug/kg/min | INTRAVENOUS | Status: AC
  Filled 2023-01-10: qty 100

## 2023-01-10 MED ORDER — NOREPINEPHRINE 4 MG/250ML-% IV SOLN
0.0000 ug/min | INTRAVENOUS | Status: AC
  Filled 2023-01-10: qty 250

## 2023-01-10 MED ORDER — PLASMA-LYTE A IV SOLN
INTRAVENOUS | Status: AC
  Filled 2023-01-10: qty 2.5

## 2023-01-10 MED ORDER — TRANEXAMIC ACID (OHS) BOLUS VIA INFUSION
15.0000 mg/kg | INTRAVENOUS | Status: AC
  Filled 2023-01-10: qty 1526

## 2023-01-10 MED ORDER — EPINEPHRINE HCL 5 MG/250ML IV SOLN IN NS
0.0000 ug/min | INTRAVENOUS | Status: AC
  Filled 2023-01-10: qty 250

## 2023-01-10 MED ORDER — VANCOMYCIN HCL 1500 MG/300ML IV SOLN
1500.0000 mg | INTRAVENOUS | Status: AC
  Filled 2023-01-10: qty 300

## 2023-01-10 MED ORDER — INSULIN REGULAR(HUMAN) IN NACL 100-0.9 UT/100ML-% IV SOLN
INTRAVENOUS | Status: AC
  Filled 2023-01-10: qty 100

## 2023-01-10 MED ORDER — DEXMEDETOMIDINE HCL IN NACL 400 MCG/100ML IV SOLN
0.1000 ug/kg/h | INTRAVENOUS | Status: AC
  Filled 2023-01-10: qty 100

## 2023-01-10 MED ORDER — NITROGLYCERIN IN D5W 200-5 MCG/ML-% IV SOLN
2.0000 ug/min | INTRAVENOUS | Status: AC
  Filled 2023-01-10: qty 250

## 2023-01-10 MED ORDER — TRANEXAMIC ACID (OHS) PUMP PRIME SOLUTION
2.0000 mg/kg | INTRAVENOUS | Status: AC
  Filled 2023-01-10: qty 2.03

## 2023-01-10 NOTE — Discharge Instructions (Signed)
Discharge Instructions:  1. You may shower, please wash incisions daily with soap and water and keep dry.  If you wish to cover wounds with dressing you may do so but please keep clean and change daily.  No tub baths or swimming until incisions have completely healed.  If your incisions become red or develop any drainage please call our office at [phone]  2. No Driving until cleared by Dr. Lightfoot's office and you are no longer using narcotic pain medications  3. Monitor your weight daily.. Please use the same scale and weigh at same time... If you gain 5-10 lbs in 48 hours with associated lower extremity swelling, please contact our office at [phone]  4. Fever of 101.5 for at least 24 hours with no source, please contact our office at [phone]  5. Activity- up as tolerated, please walk at least 3 times per day.  Avoid strenuous activity, no lifting, pushing, or pulling with your arms over 8-10 lbs for a minimum of 6 weeks  6. If any questions or concerns arise, please do not hesitate to contact our office at [phone] 

## 2023-01-10 NOTE — Hospital Course (Addendum)
HPI: This is a 57 year old male with a past medical history of OSA (on CPAP) and hypertension who was evaluated by Dr. Cliffton Asters in the office for three vessel coronary artery disease. Dr. Cliffton Asters discussed the need for coronary artery bypass grafting surgery. Potential risks, benefits, and complications of the surgery were discussed with the patient and he agreed to proceed with surgery. Pre operative carotid duplex showed no significant internal carotid artery stenosis bilaterally.  Hospital Course: Patient underwent a CABG x 4. He was transported from the OR to [facility] ICU in stable condition. He was extubated early evening of surgery. He has a history of OSA (unable to tolerate CPAP) and was put on bi pap. He did not tolerate this secondary to pain so put on [place]. He is on 4L [place] this am on post op day one. He is on Cardene and Levophed drips. He will be started on low dose Amlodipine and Cardene drip will be stopped. He was weaned off Levophed. A line and chest drains were removed.   He was transitioned off the Insulin drip. His pre op HGA1C is 5.7. He likely has pre diabetes. Accu checks and SS PRN will be stopped after transfer to the floor. We will provide nutrition information with discharge paperwork. He was ready for transfer to [facility] on post-op day 2. Mobility and pulmonary hygiene were encouraged.

## 2023-01-11 ENCOUNTER — Other Ambulatory Visit: Payer: Self-pay

## 2023-01-16 ENCOUNTER — Encounter (HOSPITAL_COMMUNITY): Payer: Self-pay | Admitting: Thoracic Surgery (Cardiothoracic Vascular Surgery)

## 2023-01-16 MED ORDER — CEFAZOLIN SODIUM-DEXTROSE 2-4 GM/100ML-% IV SOLN
2.0000 g | INTRAVENOUS | Status: AC
  Administered 2023-01-17: 2 g via INTRAVENOUS
  Filled 2023-01-16: qty 100

## 2023-01-16 MED ORDER — TRANEXAMIC ACID 1000 MG/10ML IV SOLN
1.5000 mg/kg/h | INTRAVENOUS | Status: AC
  Administered 2023-01-17: 1.5 mg/kg/h via INTRAVENOUS
  Filled 2023-01-16: qty 25

## 2023-01-16 MED ORDER — POTASSIUM CHLORIDE 2 MEQ/ML IV SOLN
80.0000 meq | INTRAVENOUS | Status: DC
  Filled 2023-01-16: qty 40

## 2023-01-16 MED ORDER — TRANEXAMIC ACID (OHS) BOLUS VIA INFUSION
15.0000 mg/kg | INTRAVENOUS | Status: AC
  Administered 2023-01-17: 1525.5 mg via INTRAVENOUS
  Filled 2023-01-16: qty 1526

## 2023-01-16 MED ORDER — EPINEPHRINE HCL 5 MG/250ML IV SOLN IN NS
0.0000 ug/min | INTRAVENOUS | Status: DC
  Filled 2023-01-16: qty 250

## 2023-01-16 MED ORDER — PLASMA-LYTE A IV SOLN
INTRAVENOUS | Status: DC
  Filled 2023-01-16: qty 2.5

## 2023-01-16 MED ORDER — HEPARIN 30,000 UNITS/1000 ML (OHS) CELLSAVER SOLUTION
Status: DC
  Filled 2023-01-16: qty 1000

## 2023-01-16 MED ORDER — PHENYLEPHRINE HCL-NACL 20-0.9 MG/250ML-% IV SOLN
30.0000 ug/min | INTRAVENOUS | Status: AC
  Administered 2023-01-17: 40 ug/min via INTRAVENOUS
  Filled 2023-01-16: qty 250

## 2023-01-16 MED ORDER — NITROGLYCERIN IN D5W 200-5 MCG/ML-% IV SOLN
2.0000 ug/min | INTRAVENOUS | Status: DC
  Filled 2023-01-16: qty 250

## 2023-01-16 MED ORDER — NOREPINEPHRINE 4 MG/250ML-% IV SOLN
0.0000 ug/min | INTRAVENOUS | Status: AC
  Administered 2023-01-17: 2 ug/min via INTRAVENOUS
  Filled 2023-01-16: qty 250

## 2023-01-16 MED ORDER — VANCOMYCIN HCL 1500 MG/300ML IV SOLN
1500.0000 mg | INTRAVENOUS | Status: AC
  Administered 2023-01-17: 1500 mg via INTRAVENOUS
  Filled 2023-01-16: qty 300

## 2023-01-16 MED ORDER — TRANEXAMIC ACID (OHS) PUMP PRIME SOLUTION
2.0000 mg/kg | INTRAVENOUS | Status: DC
  Filled 2023-01-16: qty 2.03

## 2023-01-16 MED ORDER — MANNITOL 20 % IV SOLN
INTRAVENOUS | Status: DC
  Filled 2023-01-16: qty 13

## 2023-01-16 MED ORDER — MILRINONE LACTATE IN DEXTROSE 20-5 MG/100ML-% IV SOLN
0.3000 ug/kg/min | INTRAVENOUS | Status: DC
  Filled 2023-01-16: qty 100

## 2023-01-16 MED ORDER — DEXMEDETOMIDINE HCL IN NACL 400 MCG/100ML IV SOLN
0.1000 ug/kg/h | INTRAVENOUS | Status: AC
  Administered 2023-01-17: .5 ug/kg/h via INTRAVENOUS
  Filled 2023-01-16: qty 100

## 2023-01-16 MED ORDER — INSULIN REGULAR(HUMAN) IN NACL 100-0.9 UT/100ML-% IV SOLN
INTRAVENOUS | Status: AC
  Administered 2023-01-17: .7 [IU]/h via INTRAVENOUS
  Filled 2023-01-16: qty 100

## 2023-01-16 NOTE — Progress Notes (Addendum)
SDW call  Patient had PAT appointment 01/11/2023.  Calling patient to update him with arrival time of 0630 (late start).      PCP:  Dr. Alvie Heidelberg, [place]  Cardiologist - Dr. Almedia Balls Pulmonary:    PPM/ICD - denies Device Orders - nA Rep Notified - n/a   Chest x-ray - 01/09/2023 EKG -  01/09/2023 Stress Test -5/9/20204 ECHO - 10/05/2022 Cardiac Cath - 11/29/2022  Sleep Study/sleep apnea/CPAP: Diagnosed with sleep apnea. Wears CPAP nightly  Non-diabetic  Blood Thinner Instructions: denies Aspirin Instructions: Hold DOS   ERAS Protcol - NO, NPO   COVID TEST- 01/09/2023    Anesthesia review: Previous review on 01/09/2023   Patient denies shortness of breath, fever, cough and chest pain over the phone call  Your procedure is scheduled on Wednesday January 17, 2023  Report to [facility] Main Entrance "A" at  0600  A.M., then check in with the Admitting office.  Call this number if you have problems the morning of surgery:  [phone]   If you have any questions prior to your surgery date call [phone]: Open Monday-Friday 8am-4pm If you experience any cold or flu symptoms such as cough, fever, chills, shortness of breath, etc. between now and your scheduled surgery, please notify us at the above number    Remember:  Do not eat  or drink after midnight the night before your surgery  Take these medicines the morning of surgery with A SIP OF WATER:  Amlodipine, metoprolol, refresh eye drops, crestor  As of today, STOP taking any Aspirin (unless otherwise instructed by your surgeon) Aleve, Naproxen, Ibuprofen, Motrin, Advil, Goody's, BC's, all herbal medications, fish oil, and all vitamins.  This does include your Mobic

## 2023-01-17 ENCOUNTER — Inpatient Hospital Stay (HOSPITAL_COMMUNITY)
Admission: RE | Disposition: A | Discharge status: Home Health | Payer: Self-pay | Source: Home / Self Care | Attending: Thoracic Surgery (Cardiothoracic Vascular Surgery)

## 2023-01-17 ENCOUNTER — Inpatient Hospital Stay (HOSPITAL_COMMUNITY): Payer: No Typology Code available for payment source

## 2023-01-17 ENCOUNTER — Inpatient Hospital Stay (HOSPITAL_COMMUNITY): Payer: No Typology Code available for payment source | Admitting: Physician Assistant

## 2023-01-17 ENCOUNTER — Encounter (HOSPITAL_COMMUNITY): Payer: Self-pay | Admitting: Thoracic Surgery (Cardiothoracic Vascular Surgery)

## 2023-01-17 ENCOUNTER — Other Ambulatory Visit: Payer: Self-pay

## 2023-01-17 ENCOUNTER — Inpatient Hospital Stay (HOSPITAL_COMMUNITY)
Admission: RE | Admit: 2023-01-17 | Discharge: 2023-01-23 | DRG: 236 | Disposition: A | Discharge status: Home Health | Payer: No Typology Code available for payment source | Attending: Thoracic Surgery (Cardiothoracic Vascular Surgery) | Admitting: Thoracic Surgery (Cardiothoracic Vascular Surgery)

## 2023-01-17 DIAGNOSIS — Z881 Allergy status to other antibiotic agents status: Secondary | ICD-10-CM | POA: Diagnosis not present

## 2023-01-17 DIAGNOSIS — Z79899 Other long term (current) drug therapy: Secondary | ICD-10-CM | POA: Diagnosis not present

## 2023-01-17 DIAGNOSIS — Z833 Family history of diabetes mellitus: Secondary | ICD-10-CM | POA: Diagnosis not present

## 2023-01-17 DIAGNOSIS — I25119 Atherosclerotic heart disease of native coronary artery with unspecified angina pectoris: Secondary | ICD-10-CM

## 2023-01-17 DIAGNOSIS — I25118 Atherosclerotic heart disease of native coronary artery with other forms of angina pectoris: Principal | ICD-10-CM | POA: Diagnosis present

## 2023-01-17 DIAGNOSIS — E7800 Pure hypercholesterolemia, unspecified: Secondary | ICD-10-CM

## 2023-01-17 DIAGNOSIS — Y848 Other medical procedures as the cause of abnormal reaction of the patient, or of later complication, without mention of misadventure at the time of the procedure: Secondary | ICD-10-CM | POA: Diagnosis present

## 2023-01-17 DIAGNOSIS — Z7982 Long term (current) use of aspirin: Secondary | ICD-10-CM

## 2023-01-17 DIAGNOSIS — I10 Essential (primary) hypertension: Secondary | ICD-10-CM

## 2023-01-17 DIAGNOSIS — R7303 Prediabetes: Secondary | ICD-10-CM | POA: Diagnosis present

## 2023-01-17 DIAGNOSIS — E8770 Fluid overload, unspecified: Secondary | ICD-10-CM | POA: Diagnosis not present

## 2023-01-17 DIAGNOSIS — Z8349 Family history of other endocrine, nutritional and metabolic diseases: Secondary | ICD-10-CM

## 2023-01-17 DIAGNOSIS — I2510 Atherosclerotic heart disease of native coronary artery without angina pectoris: Principal | ICD-10-CM | POA: Diagnosis present

## 2023-01-17 DIAGNOSIS — E669 Obesity, unspecified: Secondary | ICD-10-CM | POA: Diagnosis present

## 2023-01-17 DIAGNOSIS — Z791 Long term (current) use of non-steroidal anti-inflammatories (NSAID): Secondary | ICD-10-CM | POA: Diagnosis not present

## 2023-01-17 DIAGNOSIS — Z809 Family history of malignant neoplasm, unspecified: Secondary | ICD-10-CM | POA: Diagnosis not present

## 2023-01-17 DIAGNOSIS — T884XXA Failed or difficult intubation, initial encounter: Secondary | ICD-10-CM | POA: Diagnosis present

## 2023-01-17 DIAGNOSIS — Z6839 Body mass index (BMI) 39.0-39.9, adult: Secondary | ICD-10-CM | POA: Diagnosis not present

## 2023-01-17 DIAGNOSIS — E861 Hypovolemia: Secondary | ICD-10-CM | POA: Diagnosis not present

## 2023-01-17 DIAGNOSIS — H409 Unspecified glaucoma: Secondary | ICD-10-CM | POA: Diagnosis present

## 2023-01-17 DIAGNOSIS — Z8249 Family history of ischemic heart disease and other diseases of the circulatory system: Secondary | ICD-10-CM

## 2023-01-17 DIAGNOSIS — Z884 Allergy status to anesthetic agent status: Secondary | ICD-10-CM | POA: Diagnosis not present

## 2023-01-17 DIAGNOSIS — Z981 Arthrodesis status: Secondary | ICD-10-CM | POA: Diagnosis not present

## 2023-01-17 DIAGNOSIS — E785 Hyperlipidemia, unspecified: Secondary | ICD-10-CM | POA: Diagnosis present

## 2023-01-17 DIAGNOSIS — G4733 Obstructive sleep apnea (adult) (pediatric): Secondary | ICD-10-CM

## 2023-01-17 DIAGNOSIS — Z951 Presence of aortocoronary bypass graft: Secondary | ICD-10-CM

## 2023-01-17 HISTORY — PX: RADIAL ARTERY HARVEST: SHX5067

## 2023-01-17 HISTORY — PX: CORONARY ARTERY BYPASS GRAFT: SHX141

## 2023-01-17 HISTORY — PX: TEE WITHOUT CARDIOVERSION: SHX5443

## 2023-01-17 LAB — CBC
HCT: 32.4 % — ABNORMAL LOW (ref 39.0–52.0)
Hemoglobin: 11.1 g/dL — ABNORMAL LOW (ref 13.0–17.0)
MCH: 29.8 pg (ref 26.0–34.0)
MCHC: 34.3 g/dL (ref 30.0–36.0)
MCV: 86.9 fL (ref 80.0–100.0)
Platelets: 197 10*3/uL (ref 150–400)
RBC: 3.73 MIL/uL — ABNORMAL LOW (ref 4.22–5.81)
RDW: 12.4 % (ref 11.5–15.5)
WBC: 12.6 10*3/uL — ABNORMAL HIGH (ref 4.0–10.5)
nRBC: 0 % (ref 0.0–0.2)

## 2023-01-17 LAB — POCT I-STAT, CHEM 8
BUN: 14 mg/dL (ref 6–20)
BUN: 14 mg/dL (ref 6–20)
BUN: 13 mg/dL (ref 6–20)
BUN: 14 mg/dL (ref 6–20)
BUN: 14 mg/dL (ref 6–20)
Calcium, Ion: 1.28 mmol/L (ref 1.15–1.40)
Calcium, Ion: 1.23 mmol/L (ref 1.15–1.40)
Calcium, Ion: 1.07 mmol/L — ABNORMAL LOW (ref 1.15–1.40)
Calcium, Ion: 1.07 mmol/L — ABNORMAL LOW (ref 1.15–1.40)
Calcium, Ion: 1.46 mmol/L — ABNORMAL HIGH (ref 1.15–1.40)
Chloride: 106 mmol/L (ref 98–111)
Chloride: 107 mmol/L (ref 98–111)
Chloride: 103 mmol/L (ref 98–111)
Chloride: 116 mmol/L — ABNORMAL HIGH (ref 98–111)
Chloride: 107 mmol/L (ref 98–111)
Creatinine, Ser: 0.9 mg/dL (ref 0.61–1.24)
Creatinine, Ser: 1 mg/dL (ref 0.61–1.24)
Creatinine, Ser: 0.9 mg/dL (ref 0.61–1.24)
Creatinine, Ser: 0.9 mg/dL (ref 0.61–1.24)
Creatinine, Ser: 0.9 mg/dL (ref 0.61–1.24)
Glucose, Bld: 102 mg/dL — ABNORMAL HIGH (ref 70–99)
Glucose, Bld: 99 mg/dL (ref 70–99)
Glucose, Bld: 97 mg/dL (ref 70–99)
Glucose, Bld: 115 mg/dL — ABNORMAL HIGH (ref 70–99)
Glucose, Bld: 120 mg/dL — ABNORMAL HIGH (ref 70–99)
HCT: 41 % (ref 39.0–52.0)
HCT: 40 % (ref 39.0–52.0)
HCT: 32 % — ABNORMAL LOW (ref 39.0–52.0)
HCT: 28 % — ABNORMAL LOW (ref 39.0–52.0)
HCT: 31 % — ABNORMAL LOW (ref 39.0–52.0)
Hemoglobin: 13.9 g/dL (ref 13.0–17.0)
Hemoglobin: 13.6 g/dL (ref 13.0–17.0)
Hemoglobin: 10.9 g/dL — ABNORMAL LOW (ref 13.0–17.0)
Hemoglobin: 9.5 g/dL — ABNORMAL LOW (ref 13.0–17.0)
Hemoglobin: 10.5 g/dL — ABNORMAL LOW (ref 13.0–17.0)
Potassium: 4.2 mmol/L (ref 3.5–5.1)
Potassium: 4.5 mmol/L (ref 3.5–5.1)
Potassium: 5.1 mmol/L (ref 3.5–5.1)
Potassium: 6 mmol/L — ABNORMAL HIGH (ref 3.5–5.1)
Potassium: 5 mmol/L (ref 3.5–5.1)
Sodium: 141 mmol/L (ref 135–145)
Sodium: 138 mmol/L (ref 135–145)
Sodium: 138 mmol/L (ref 135–145)
Sodium: 136 mmol/L (ref 135–145)
Sodium: 139 mmol/L (ref 135–145)
TCO2: 22 mmol/L (ref 22–32)
TCO2: 22 mmol/L (ref 22–32)
TCO2: 21 mmol/L — ABNORMAL LOW (ref 22–32)
TCO2: 24 mmol/L (ref 22–32)
TCO2: 23 mmol/L (ref 22–32)

## 2023-01-17 LAB — POCT I-STAT 7, (LYTES, BLD GAS, ICA,H+H)
Acid-base deficit: 4 mmol/L — ABNORMAL HIGH (ref 0.0–2.0)
Acid-base deficit: 6 mmol/L — ABNORMAL HIGH (ref 0.0–2.0)
Acid-base deficit: 2 mmol/L (ref 0.0–2.0)
Acid-base deficit: 8 mmol/L — ABNORMAL HIGH (ref 0.0–2.0)
Acid-base deficit: 6 mmol/L — ABNORMAL HIGH (ref 0.0–2.0)
Acid-base deficit: 14 mmol/L — ABNORMAL HIGH (ref 0.0–2.0)
Acid-base deficit: 4 mmol/L — ABNORMAL HIGH (ref 0.0–2.0)
Bicarbonate: 22.7 mmol/L (ref 20.0–28.0)
Bicarbonate: 19.5 mmol/L — ABNORMAL LOW (ref 20.0–28.0)
Bicarbonate: 23.9 mmol/L (ref 20.0–28.0)
Bicarbonate: 18.4 mmol/L — ABNORMAL LOW (ref 20.0–28.0)
Bicarbonate: 20 mmol/L (ref 20.0–28.0)
Bicarbonate: 11.9 mmol/L — ABNORMAL LOW (ref 20.0–28.0)
Bicarbonate: 22.4 mmol/L (ref 20.0–28.0)
Calcium, Ion: 1.28 mmol/L (ref 1.15–1.40)
Calcium, Ion: 1.05 mmol/L — ABNORMAL LOW (ref 1.15–1.40)
Calcium, Ion: 1.48 mmol/L — ABNORMAL HIGH (ref 1.15–1.40)
Calcium, Ion: 1.15 mmol/L (ref 1.15–1.40)
Calcium, Ion: 1.09 mmol/L — ABNORMAL LOW (ref 1.15–1.40)
Calcium, Ion: 0.85 mmol/L — CL (ref 1.15–1.40)
Calcium, Ion: 1.17 mmol/L (ref 1.15–1.40)
HCT: 43 % (ref 39.0–52.0)
HCT: 32 % — ABNORMAL LOW (ref 39.0–52.0)
HCT: 32 % — ABNORMAL LOW (ref 39.0–52.0)
HCT: 27 % — ABNORMAL LOW (ref 39.0–52.0)
HCT: 29 % — ABNORMAL LOW (ref 39.0–52.0)
HCT: 18 % — ABNORMAL LOW (ref 39.0–52.0)
HCT: 29 % — ABNORMAL LOW (ref 39.0–52.0)
Hemoglobin: 14.6 g/dL (ref 13.0–17.0)
Hemoglobin: 10.9 g/dL — ABNORMAL LOW (ref 13.0–17.0)
Hemoglobin: 10.9 g/dL — ABNORMAL LOW (ref 13.0–17.0)
Hemoglobin: 9.2 g/dL — ABNORMAL LOW (ref 13.0–17.0)
Hemoglobin: 9.9 g/dL — ABNORMAL LOW (ref 13.0–17.0)
Hemoglobin: 6.1 g/dL — CL (ref 13.0–17.0)
Hemoglobin: 9.9 g/dL — ABNORMAL LOW (ref 13.0–17.0)
O2 Saturation: 99 %
O2 Saturation: 100 %
O2 Saturation: 99 %
O2 Saturation: 96 %
O2 Saturation: 94 %
O2 Saturation: 97 %
O2 Saturation: 96 %
Patient temperature: 36.7
Patient temperature: 36.9
Patient temperature: 38.2
Patient temperature: 38.2
Potassium: 4.2 mmol/L (ref 3.5–5.1)
Potassium: 5.2 mmol/L — ABNORMAL HIGH (ref 3.5–5.1)
Potassium: 5 mmol/L (ref 3.5–5.1)
Potassium: 3.6 mmol/L (ref 3.5–5.1)
Potassium: 3.7 mmol/L (ref 3.5–5.1)
Potassium: 2.7 mmol/L — CL (ref 3.5–5.1)
Potassium: 4.8 mmol/L (ref 3.5–5.1)
Sodium: 140 mmol/L (ref 135–145)
Sodium: 138 mmol/L (ref 135–145)
Sodium: 140 mmol/L (ref 135–145)
Sodium: 145 mmol/L (ref 135–145)
Sodium: 145 mmol/L (ref 135–145)
Sodium: 150 mmol/L — ABNORMAL HIGH (ref 135–145)
Sodium: 139 mmol/L (ref 135–145)
TCO2: 24 mmol/L (ref 22–32)
TCO2: 21 mmol/L — ABNORMAL LOW (ref 22–32)
TCO2: 25 mmol/L (ref 22–32)
TCO2: 20 mmol/L — ABNORMAL LOW (ref 22–32)
TCO2: 21 mmol/L — ABNORMAL LOW (ref 22–32)
TCO2: 13 mmol/L — ABNORMAL LOW (ref 22–32)
TCO2: 24 mmol/L (ref 22–32)
pCO2 arterial: 44.4 mmHg (ref 32–48)
pCO2 arterial: 38.4 mmHg (ref 32–48)
pCO2 arterial: 46 mmHg (ref 32–48)
pCO2 arterial: 39.6 mmHg (ref 32–48)
pCO2 arterial: 42.5 mmHg (ref 32–48)
pCO2 arterial: 28.6 mmHg — ABNORMAL LOW (ref 32–48)
pCO2 arterial: 45.9 mmHg (ref 32–48)
pH, Arterial: 7.317 — ABNORMAL LOW (ref 7.35–7.45)
pH, Arterial: 7.314 — ABNORMAL LOW (ref 7.35–7.45)
pH, Arterial: 7.323 — ABNORMAL LOW (ref 7.35–7.45)
pH, Arterial: 7.275 — ABNORMAL LOW (ref 7.35–7.45)
pH, Arterial: 7.281 — ABNORMAL LOW (ref 7.35–7.45)
pH, Arterial: 7.234 — ABNORMAL LOW (ref 7.35–7.45)
pH, Arterial: 7.301 — ABNORMAL LOW (ref 7.35–7.45)
pO2, Arterial: 157 mmHg — ABNORMAL HIGH (ref 83–108)
pO2, Arterial: 336 mmHg — ABNORMAL HIGH (ref 83–108)
pO2, Arterial: 149 mmHg — ABNORMAL HIGH (ref 83–108)
pO2, Arterial: 91 mmHg (ref 83–108)
pO2, Arterial: 78 mmHg — ABNORMAL LOW (ref 83–108)
pO2, Arterial: 106 mmHg (ref 83–108)
pO2, Arterial: 96 mmHg (ref 83–108)

## 2023-01-17 LAB — HEMOGLOBIN AND HEMATOCRIT, BLOOD
HCT: 30.7 % — ABNORMAL LOW (ref 39.0–52.0)
Hemoglobin: 10.5 g/dL — ABNORMAL LOW (ref 13.0–17.0)

## 2023-01-17 LAB — GLUCOSE, CAPILLARY
Glucose-Capillary: 76 mg/dL (ref 70–99)
Glucose-Capillary: 104 mg/dL — ABNORMAL HIGH (ref 70–99)
Glucose-Capillary: 103 mg/dL — ABNORMAL HIGH (ref 70–99)
Glucose-Capillary: 78 mg/dL (ref 70–99)
Glucose-Capillary: 72 mg/dL (ref 70–99)
Glucose-Capillary: 74 mg/dL (ref 70–99)

## 2023-01-17 LAB — CBC WITH DIFFERENTIAL/PLATELET
Abs Immature Granulocytes: 0.08 10*3/uL — ABNORMAL HIGH (ref 0.00–0.07)
Basophils Absolute: 0.1 10*3/uL (ref 0.0–0.1)
Basophils Relative: 0 %
Eosinophils Absolute: 0 10*3/uL (ref 0.0–0.5)
Eosinophils Relative: 0 %
HCT: 32.3 % — ABNORMAL LOW (ref 39.0–52.0)
Hemoglobin: 10.9 g/dL — ABNORMAL LOW (ref 13.0–17.0)
Immature Granulocytes: 1 %
Lymphocytes Relative: 6 %
Lymphs Abs: 1 10*3/uL (ref 0.7–4.0)
MCH: 29.1 pg (ref 26.0–34.0)
MCHC: 33.7 g/dL (ref 30.0–36.0)
MCV: 86.4 fL (ref 80.0–100.0)
Monocytes Absolute: 1.5 10*3/uL — ABNORMAL HIGH (ref 0.1–1.0)
Monocytes Relative: 9 %
Neutro Abs: 13.9 10*3/uL — ABNORMAL HIGH (ref 1.7–7.7)
Neutrophils Relative %: 84 %
Platelets: 314 10*3/uL (ref 150–400)
RBC: 3.74 MIL/uL — ABNORMAL LOW (ref 4.22–5.81)
RDW: 12.4 % (ref 11.5–15.5)
WBC: 16.6 10*3/uL — ABNORMAL HIGH (ref 4.0–10.5)
nRBC: 0 % (ref 0.0–0.2)

## 2023-01-17 LAB — PLATELET COUNT: Platelets: 313 10*3/uL (ref 150–400)

## 2023-01-17 LAB — BASIC METABOLIC PANEL
Anion gap: 10 (ref 5–15)
BUN: 12 mg/dL (ref 6–20)
CO2: 20 mmol/L — ABNORMAL LOW (ref 22–32)
Calcium: 7.1 mg/dL — ABNORMAL LOW (ref 8.9–10.3)
Chloride: 112 mmol/L — ABNORMAL HIGH (ref 98–111)
Creatinine, Ser: 0.93 mg/dL (ref 0.61–1.24)
GFR, Estimated: >60 mL/min (ref >60)
Glucose, Bld: 118 mg/dL — ABNORMAL HIGH (ref 70–99)
Potassium: 3.9 mmol/L (ref 3.5–5.1)
Sodium: 142 mmol/L (ref 135–145)

## 2023-01-17 LAB — PROTIME-INR
INR: 1.4 — ABNORMAL HIGH (ref 0.8–1.2)
Prothrombin Time: 17.3 seconds — ABNORMAL HIGH (ref 11.4–15.2)

## 2023-01-17 LAB — ABO/RH: ABO/RH(D): B NEG

## 2023-01-17 LAB — APTT: aPTT: 35 seconds (ref 24–36)

## 2023-01-17 SURGERY — CORONARY ARTERY BYPASS GRAFTING (CABG)
Anesthesia: General | Site: Chest

## 2023-01-17 MED ORDER — PROTAMINE SULFATE 10 MG/ML IV SOLN
INTRAVENOUS | Status: DC | PRN
  Administered 2023-01-17: 35 mg via INTRAVENOUS

## 2023-01-17 MED ORDER — FENTANYL CITRATE (PF) 250 MCG/5ML IJ SOLN
INTRAMUSCULAR | Status: DC | PRN
  Administered 2023-01-17: 50 ug via INTRAVENOUS
  Administered 2023-01-17 (×8): 100 ug via INTRAVENOUS
  Administered 2023-01-17 (×2): 50 ug via INTRAVENOUS
  Administered 2023-01-17: 100 ug via INTRAVENOUS

## 2023-01-17 MED ORDER — METOPROLOL TARTRATE 12.5 MG HALF TABLET
12.5000 mg | ORAL_TABLET | Freq: Two times a day (BID) | ORAL | Status: DC
  Administered 2023-01-18 – 2023-01-19 (×2): 12.5 mg via ORAL
  Filled 2023-01-17 (×2): qty 1

## 2023-01-17 MED ORDER — BISACODYL 10 MG RE SUPP: 10.0000 mg | Freq: Every day | RECTAL | Status: DC

## 2023-01-17 MED ORDER — LATANOPROST 0.005 % OP SOLN
1.0000 [drp] | Freq: Every day | OPHTHALMIC | Status: DC
  Administered 2023-01-17 – 2023-01-22 (×4): 1 [drp] via OPHTHALMIC
  Filled 2023-01-17 (×4): qty 2.5

## 2023-01-17 MED ORDER — LACTATED RINGERS IV SOLN: INTRAVENOUS | Status: DC | PRN

## 2023-01-17 MED ORDER — HEPARIN SODIUM (PORCINE) 1000 UNIT/ML IJ SOLN
INTRAMUSCULAR | Status: AC
  Filled 2023-01-17: qty 2

## 2023-01-17 MED ORDER — ASPIRIN 325 MG PO TBEC
325.0000 mg | DELAYED_RELEASE_TABLET | Freq: Every day | ORAL | Status: DC
  Administered 2023-01-18 – 2023-01-19 (×2): 325 mg via ORAL
  Filled 2023-01-17 (×2): qty 1

## 2023-01-17 MED ORDER — HYDROMORPHONE HCL 1 MG/ML IJ SOLN
1.0000 mg | INTRAMUSCULAR | Status: DC | PRN
  Administered 2023-01-17 – 2023-01-19 (×6): 1 mg via INTRAVENOUS
  Filled 2023-01-17 (×6): qty 1

## 2023-01-17 MED ORDER — VASOPRESSIN 20 UNIT/ML IV SOLN
INTRAVENOUS | Status: DC | PRN
Start: 2023-01-17 — End: 2023-01-17
  Administered 2023-01-17: 1 [IU] via INTRAVENOUS

## 2023-01-17 MED ORDER — INSULIN REGULAR(HUMAN) IN NACL 100-0.9 UT/100ML-% IV SOLN: INTRAVENOUS | Status: DC

## 2023-01-17 MED ORDER — METOCLOPRAMIDE HCL 5 MG/ML IJ SOLN
10.0000 mg | Freq: Four times a day (QID) | INTRAMUSCULAR | Status: AC
  Administered 2023-01-17 – 2023-01-19 (×6): 10 mg via INTRAVENOUS
  Filled 2023-01-17 (×6): qty 2

## 2023-01-17 MED ORDER — CHLORHEXIDINE GLUCONATE 0.12 % MT SOLN
15.0000 mL | Freq: Once | OROMUCOSAL | Status: DC
  Filled 2023-01-17: qty 15

## 2023-01-17 MED ORDER — PHENYLEPHRINE 80 MCG/ML (10ML) SYRINGE FOR IV PUSH (FOR BLOOD PRESSURE SUPPORT)
PREFILLED_SYRINGE | INTRAVENOUS | Status: AC
  Filled 2023-01-17: qty 10

## 2023-01-17 MED ORDER — LACTATED RINGERS IV SOLN: INTRAVENOUS | Status: DC

## 2023-01-17 MED ORDER — CHLORHEXIDINE GLUCONATE 4 % EX SOLN: 30.0000 mL | CUTANEOUS | Status: DC

## 2023-01-17 MED ORDER — PROPOFOL 10 MG/ML IV BOLUS
INTRAVENOUS | Status: AC
  Filled 2023-01-17: qty 20

## 2023-01-17 MED ORDER — OXYCODONE HCL 5 MG PO TABS
5.0000 mg | ORAL_TABLET | ORAL | Status: DC | PRN
  Administered 2023-01-17 – 2023-01-19 (×8): 10 mg via ORAL
  Administered 2023-01-20: 5 mg via ORAL
  Administered 2023-01-20: 10 mg via ORAL
  Administered 2023-01-20: 5 mg via ORAL
  Administered 2023-01-21: 10 mg via ORAL
  Administered 2023-01-21: 5 mg via ORAL
  Administered 2023-01-21 – 2023-01-23 (×12): 10 mg via ORAL
  Filled 2023-01-17 (×8): qty 2
  Filled 2023-01-17: qty 1
  Filled 2023-01-17 (×17): qty 2

## 2023-01-17 MED ORDER — PROTAMINE SULFATE 10 MG/ML IV SOLN
INTRAVENOUS | Status: AC
  Filled 2023-01-17: qty 50

## 2023-01-17 MED ORDER — FENTANYL CITRATE (PF) 250 MCG/5ML IJ SOLN
INTRAMUSCULAR | Status: AC
  Filled 2023-01-17: qty 5

## 2023-01-17 MED ORDER — 0.9 % SODIUM CHLORIDE (POUR BTL) OPTIME
TOPICAL | Status: DC | PRN
Start: 2023-01-17 — End: 2023-01-17
  Administered 2023-01-17: 5000 mL

## 2023-01-17 MED ORDER — DEXMEDETOMIDINE HCL IN NACL 400 MCG/100ML IV SOLN
0.0000 ug/kg/h | INTRAVENOUS | Status: DC
  Administered 2023-01-17: 0.7 ug/kg/h via INTRAVENOUS
  Filled 2023-01-17: qty 100

## 2023-01-17 MED ORDER — SODIUM CHLORIDE 0.9 % IV SOLN: INTRAVENOUS | Status: DC | PRN

## 2023-01-17 MED ORDER — CEFAZOLIN SODIUM-DEXTROSE 2-4 GM/100ML-% IV SOLN
2.0000 g | Freq: Three times a day (TID) | INTRAVENOUS | Status: AC
  Administered 2023-01-17 – 2023-01-19 (×6): 2 g via INTRAVENOUS
  Filled 2023-01-17 (×6): qty 100

## 2023-01-17 MED ORDER — POLYVINYL ALCOHOL 1.4 % OP SOLN: 1.0000 [drp] | OPHTHALMIC | Status: DC | PRN

## 2023-01-17 MED ORDER — DEXTROSE 50 % IV SOLN: 0.0000 mL | INTRAVENOUS | Status: DC | PRN

## 2023-01-17 MED ORDER — ROSUVASTATIN CALCIUM 20 MG PO TABS
20.0000 mg | ORAL_TABLET | Freq: Every day | ORAL | Status: DC
  Administered 2023-01-17: 20 mg via ORAL
  Filled 2023-01-17: qty 1

## 2023-01-17 MED ORDER — NOREPINEPHRINE 4 MG/250ML-% IV SOLN
0.0000 ug/min | INTRAVENOUS | Status: DC
  Administered 2023-01-17: 10 ug/min via INTRAVENOUS
  Administered 2023-01-17: 4 ug/min via INTRAVENOUS
  Administered 2023-01-18: 12 ug/min via INTRAVENOUS
  Filled 2023-01-17 (×2): qty 250

## 2023-01-17 MED ORDER — POLYVINYL ALCOHOL-POVIDONE PF 1.4-0.6 % OP SOLN
1.0000 [drp] | Freq: Every day | OPHTHALMIC | Status: DC
Start: 2023-01-18 — End: 2023-01-17

## 2023-01-17 MED ORDER — SODIUM BICARBONATE 8.4 % IV SOLN
50.0000 meq | Freq: Once | INTRAVENOUS | Status: AC
  Administered 2023-01-17: 50 meq via INTRAVENOUS

## 2023-01-17 MED ORDER — ALBUMIN HUMAN 5 % IV SOLN
250.0000 mL | INTRAVENOUS | Status: AC | PRN
  Administered 2023-01-17 – 2023-01-18 (×5): 12.5 g via INTRAVENOUS
  Filled 2023-01-17 (×3): qty 250

## 2023-01-17 MED ORDER — PROPOFOL 10 MG/ML IV BOLUS
INTRAVENOUS | Status: DC | PRN
Start: 2023-01-17 — End: 2023-01-17
  Administered 2023-01-17: 50 mg via INTRAVENOUS
  Administered 2023-01-17: 100 mg via INTRAVENOUS

## 2023-01-17 MED ORDER — ONDANSETRON HCL 4 MG/2ML IJ SOLN
4.0000 mg | Freq: Four times a day (QID) | INTRAMUSCULAR | Status: DC | PRN
  Administered 2023-01-20: 4 mg via INTRAVENOUS
  Filled 2023-01-17 (×3): qty 2

## 2023-01-17 MED ORDER — MIDAZOLAM HCL (PF) 5 MG/ML IJ SOLN
INTRAMUSCULAR | Status: DC | PRN
  Administered 2023-01-17 (×2): 2 mg via INTRAVENOUS
  Administered 2023-01-17 (×2): 1 mg via INTRAVENOUS
  Administered 2023-01-17: 2 mg via INTRAVENOUS

## 2023-01-17 MED ORDER — METOPROLOL TARTRATE 25 MG/10 ML ORAL SUSPENSION: 12.5000 mg | Freq: Two times a day (BID) | ORAL | Status: DC

## 2023-01-17 MED ORDER — SODIUM CHLORIDE 0.9% FLUSH
3.0000 mL | Freq: Two times a day (BID) | INTRAVENOUS | Status: DC
  Administered 2023-01-18 – 2023-01-19 (×3): 3 mL via INTRAVENOUS

## 2023-01-17 MED ORDER — POTASSIUM CHLORIDE 10 MEQ/50ML IV SOLN
10.0000 meq | INTRAVENOUS | Status: AC
  Administered 2023-01-17: 10 meq via INTRAVENOUS
  Filled 2023-01-17 (×3): qty 50

## 2023-01-17 MED ORDER — MIDAZOLAM HCL (PF) 10 MG/2ML IJ SOLN
INTRAMUSCULAR | Status: AC
  Filled 2023-01-17: qty 2

## 2023-01-17 MED ORDER — PHENYLEPHRINE HCL-NACL 20-0.9 MG/250ML-% IV SOLN
0.0000 ug/min | INTRAVENOUS | Status: DC
  Administered 2023-01-17: 10 ug/min via INTRAVENOUS

## 2023-01-17 MED ORDER — MIDAZOLAM HCL 2 MG/2ML IJ SOLN
2.0000 mg | INTRAMUSCULAR | Status: DC | PRN
  Administered 2023-01-17: 2 mg via INTRAVENOUS
  Filled 2023-01-17: qty 2

## 2023-01-17 MED ORDER — CALCIUM CHLORIDE 10 % IV SOLN
INTRAVENOUS | Status: DC | PRN
  Administered 2023-01-17 (×2): 200 mg via INTRAVENOUS

## 2023-01-17 MED ORDER — ~~LOC~~ CARDIAC SURGERY, PATIENT & FAMILY EDUCATION
Freq: Once | Status: DC
  Filled 2023-01-17: qty 1

## 2023-01-17 MED ORDER — VANCOMYCIN HCL IN DEXTROSE 1-5 GM/200ML-% IV SOLN
1000.0000 mg | Freq: Once | INTRAVENOUS | Status: AC
  Administered 2023-01-17: 1000 mg via INTRAVENOUS
  Filled 2023-01-17: qty 200

## 2023-01-17 MED ORDER — DEXAMETHASONE SODIUM PHOSPHATE 10 MG/ML IJ SOLN
INTRAMUSCULAR | Status: AC
  Filled 2023-01-17: qty 1

## 2023-01-17 MED ORDER — ETOMIDATE 2 MG/ML IV SOLN
INTRAVENOUS | Status: AC
  Administered 2023-01-17: 20 mg
  Filled 2023-01-17: qty 10

## 2023-01-17 MED ORDER — HEPARIN SODIUM (PORCINE) 1000 UNIT/ML IJ SOLN
INTRAMUSCULAR | Status: DC | PRN
  Administered 2023-01-17: 35000 [IU] via INTRAVENOUS

## 2023-01-17 MED ORDER — ROCURONIUM BROMIDE 10 MG/ML (PF) SYRINGE
PREFILLED_SYRINGE | INTRAVENOUS | Status: AC
  Filled 2023-01-17: qty 10

## 2023-01-17 MED ORDER — ASPIRIN 81 MG PO CHEW
324.0000 mg | CHEWABLE_TABLET | Freq: Once | ORAL | Status: AC
  Administered 2023-01-17: 324 mg via ORAL
  Filled 2023-01-17: qty 4

## 2023-01-17 MED ORDER — PLASMA-LYTE A IV SOLN: INTRAVENOUS | Status: DC | PRN

## 2023-01-17 MED ORDER — MAGNESIUM SULFATE 4 GM/100ML IV SOLN
4.0000 g | Freq: Once | INTRAVENOUS | Status: AC
  Administered 2023-01-17: 4 g via INTRAVENOUS
  Filled 2023-01-17: qty 100

## 2023-01-17 MED ORDER — METOPROLOL TARTRATE 5 MG/5ML IV SOLN: 2.5000 mg | INTRAVENOUS | Status: DC | PRN

## 2023-01-17 MED ORDER — PHENYLEPHRINE 80 MCG/ML (10ML) SYRINGE FOR IV PUSH (FOR BLOOD PRESSURE SUPPORT)
PREFILLED_SYRINGE | INTRAVENOUS | Status: DC | PRN
  Administered 2023-01-17: 80 ug via INTRAVENOUS
  Administered 2023-01-17 (×2): 40 ug via INTRAVENOUS

## 2023-01-17 MED ORDER — ACETAMINOPHEN 160 MG/5ML PO SOLN: 1000.0000 mg | Freq: Four times a day (QID) | ORAL | Status: DC

## 2023-01-17 MED ORDER — ACETAMINOPHEN 160 MG/5ML PO SOLN
650.0000 mg | Freq: Once | ORAL | Status: AC
  Administered 2023-01-17: 650 mg
  Filled 2023-01-17: qty 20.3

## 2023-01-17 MED ORDER — POTASSIUM CHLORIDE 10 MEQ/50ML IV SOLN
10.0000 meq | INTRAVENOUS | Status: AC
  Administered 2023-01-17 (×3): 10 meq via INTRAVENOUS

## 2023-01-17 MED ORDER — CHLORHEXIDINE GLUCONATE 0.12 % MT SOLN
15.0000 mL | OROMUCOSAL | Status: AC
  Administered 2023-01-17: 15 mL via OROMUCOSAL
  Filled 2023-01-17: qty 15

## 2023-01-17 MED ORDER — ASPIRIN 81 MG PO CHEW: 324.0000 mg | CHEWABLE_TABLET | Freq: Every day | ORAL | Status: DC

## 2023-01-17 MED ORDER — PANTOPRAZOLE SODIUM 40 MG PO TBEC
40.0000 mg | DELAYED_RELEASE_TABLET | Freq: Every day | ORAL | Status: DC
  Administered 2023-01-19 – 2023-01-23 (×5): 40 mg via ORAL
  Filled 2023-01-17 (×6): qty 1

## 2023-01-17 MED ORDER — SODIUM CHLORIDE 0.45 % IV SOLN: INTRAVENOUS | Status: DC | PRN

## 2023-01-17 MED ORDER — PANTOPRAZOLE SODIUM 40 MG IV SOLR
40.0000 mg | Freq: Every day | INTRAVENOUS | Status: AC
  Administered 2023-01-17 – 2023-01-18 (×2): 40 mg via INTRAVENOUS
  Filled 2023-01-17 (×2): qty 10

## 2023-01-17 MED ORDER — ROCURONIUM BROMIDE 10 MG/ML (PF) SYRINGE
PREFILLED_SYRINGE | INTRAVENOUS | Status: AC
  Filled 2023-01-17: qty 20

## 2023-01-17 MED ORDER — METHOCARBAMOL 1000 MG/10ML IJ SOLN
500.0000 mg | Freq: Three times a day (TID) | INTRAVENOUS | Status: AC
  Administered 2023-01-17 – 2023-01-20 (×8): 500 mg via INTRAVENOUS
  Filled 2023-01-17 (×8): qty 500

## 2023-01-17 MED ORDER — SODIUM CHLORIDE 0.9% FLUSH: 3.0000 mL | INTRAVENOUS | Status: DC | PRN

## 2023-01-17 MED ORDER — BISACODYL 5 MG PO TBEC
10.0000 mg | DELAYED_RELEASE_TABLET | Freq: Every day | ORAL | Status: DC
  Administered 2023-01-18 – 2023-01-19 (×2): 10 mg via ORAL
  Filled 2023-01-17 (×2): qty 2

## 2023-01-17 MED ORDER — CHLORHEXIDINE GLUCONATE CLOTH 2 % EX PADS
6.0000 | MEDICATED_PAD | Freq: Every day | CUTANEOUS | Status: DC
  Administered 2023-01-17 – 2023-01-19 (×3): 6 via TOPICAL

## 2023-01-17 MED ORDER — NICARDIPINE HCL IN NACL 20-0.86 MG/200ML-% IV SOLN
5.0000 mg/h | INTRAVENOUS | Status: DC
  Administered 2023-01-17 – 2023-01-18 (×2): 5 mg/h via INTRAVENOUS
  Filled 2023-01-17: qty 200

## 2023-01-17 MED ORDER — ACETAMINOPHEN 500 MG PO TABS
1000.0000 mg | ORAL_TABLET | Freq: Four times a day (QID) | ORAL | Status: DC
  Administered 2023-01-17 – 2023-01-19 (×6): 1000 mg via ORAL
  Filled 2023-01-17 (×7): qty 2

## 2023-01-17 MED ORDER — SODIUM CHLORIDE 0.9 % IV SOLN: INTRAVENOUS | Status: DC

## 2023-01-17 MED ORDER — LIDOCAINE 2% (20 MG/ML) 5 ML SYRINGE
INTRAMUSCULAR | Status: AC
  Filled 2023-01-17: qty 5

## 2023-01-17 MED ORDER — VASOPRESSIN 20 UNIT/ML IV SOLN
INTRAVENOUS | Status: AC
  Filled 2023-01-17: qty 1

## 2023-01-17 MED ORDER — TRAMADOL HCL 50 MG PO TABS
50.0000 mg | ORAL_TABLET | ORAL | Status: DC | PRN
  Administered 2023-01-18: 50 mg via ORAL
  Administered 2023-01-18 – 2023-01-19 (×2): 100 mg via ORAL
  Filled 2023-01-17: qty 1
  Filled 2023-01-17 (×2): qty 2

## 2023-01-17 MED ORDER — SODIUM CHLORIDE 0.9 % IV SOLN: 250.0000 mL | INTRAVENOUS | Status: DC

## 2023-01-17 MED ORDER — DOCUSATE SODIUM 100 MG PO CAPS
200.0000 mg | ORAL_CAPSULE | Freq: Every day | ORAL | Status: DC
  Administered 2023-01-18 – 2023-01-19 (×2): 200 mg via ORAL
  Filled 2023-01-17 (×2): qty 2

## 2023-01-17 MED ORDER — ROCURONIUM BROMIDE 10 MG/ML (PF) SYRINGE
PREFILLED_SYRINGE | INTRAVENOUS | Status: DC | PRN
  Administered 2023-01-17: 20 mg via INTRAVENOUS
  Administered 2023-01-17: 100 mg via INTRAVENOUS
  Administered 2023-01-17 (×3): 50 mg via INTRAVENOUS

## 2023-01-17 MED ORDER — ALBUMIN HUMAN 5 % IV SOLN: INTRAVENOUS | Status: DC | PRN

## 2023-01-17 MED ORDER — CALCIUM CHLORIDE 10 % IV SOLN
INTRAVENOUS | Status: AC
  Filled 2023-01-17: qty 10

## 2023-01-17 MED ORDER — THROMBIN 20000 UNITS EX SOLR: OROMUCOSAL | Status: DC | PRN

## 2023-01-17 MED ORDER — MORPHINE SULFATE (PF) 2 MG/ML IV SOLN
1.0000 mg | INTRAVENOUS | Status: DC | PRN
  Administered 2023-01-17: 4 mg via INTRAVENOUS
  Administered 2023-01-17: 2 mg via INTRAVENOUS
  Administered 2023-01-17 – 2023-01-18 (×2): 4 mg via INTRAVENOUS
  Administered 2023-01-18: 2 mg via INTRAVENOUS
  Administered 2023-01-18 (×2): 4 mg via INTRAVENOUS
  Filled 2023-01-17 (×3): qty 2
  Filled 2023-01-17: qty 1
  Filled 2023-01-17 (×3): qty 2

## 2023-01-17 MED ORDER — METOPROLOL TARTRATE 12.5 MG HALF TABLET: 12.5000 mg | ORAL_TABLET | Freq: Once | ORAL | Status: DC

## 2023-01-17 SURGICAL SUPPLY — 110 items
ADH SKN CLS APL DERMABOND .7 (GAUZE/BANDAGES/DRESSINGS) ×3
APPLIER CLIP 9.375 SM OPEN (CLIP) ×3
APR CLP SM 9.3 20 MLT OPN (CLIP) ×3
BAG DECANTER FOR FLEXI CONT (MISCELLANEOUS) ×3 IMPLANT
BLADE CLIPPER SURG (BLADE) ×3 IMPLANT
BLADE STERNUM SYSTEM 6 (BLADE) ×3 IMPLANT
BLADE SURG 11 STRL SS (BLADE) IMPLANT
BLADE SURG 15 STRL LF DISP TIS (BLADE) ×3 IMPLANT
BLADE SURG 15 STRL SS (BLADE) ×3
BNDG CMPR 5X4 KNIT ELC UNQ LF (GAUZE/BANDAGES/DRESSINGS) ×6
BNDG CMPR 6 X 5 YARDS HK CLSR (GAUZE/BANDAGES/DRESSINGS) ×3
BNDG ELASTIC 4INX 5YD STR LF (GAUZE/BANDAGES/DRESSINGS) IMPLANT
BNDG ELASTIC 6INX 5YD STR LF (GAUZE/BANDAGES/DRESSINGS) IMPLANT
BNDG GAUZE DERMACEA FLUFF 4 (GAUZE/BANDAGES/DRESSINGS) ×3 IMPLANT
BNDG GZE DERMACEA 4 6PLY (GAUZE/BANDAGES/DRESSINGS) ×6
CABLE SURGICAL S-101-97-12 (CABLE) ×3 IMPLANT
CANISTER SUCT 3000ML PPV (MISCELLANEOUS) ×3 IMPLANT
CANNULA MC2 2 STG 29/37 NON-V (CANNULA) ×3 IMPLANT
CANNULA NON VENT 22FR 12 (CANNULA) IMPLANT
CANNULA VESSEL 3MM BLUNT TIP (CANNULA) IMPLANT
CATH ROBINSON RED A/P 18FR (CATHETERS) ×6 IMPLANT
CLIP APPLIE 9.375 SM OPEN (CLIP) ×3 IMPLANT
CLIP RETRACTION 3.0MM CORONARY (MISCELLANEOUS) IMPLANT
CLIP TI MEDIUM 24 (CLIP) IMPLANT
CLIP TI WIDE RED SMALL 24 (CLIP) IMPLANT
CONNECTOR BLAKE 2:1 CARIO BLK (MISCELLANEOUS) ×3 IMPLANT
CONTAINER PROTECT SURGISLUSH (MISCELLANEOUS) ×6 IMPLANT
COVER MAYO STAND STRL (DRAPES) ×3 IMPLANT
CUFF TOURN SGL QUICK 18X4 (TOURNIQUET CUFF) IMPLANT
CUFF TOURN SGL QUICK 24 (TOURNIQUET CUFF)
CUFF TRNQT CYL 24X4X16.5-23 (TOURNIQUET CUFF) IMPLANT
DERMABOND ADVANCED .7 DNX12 (GAUZE/BANDAGES/DRESSINGS) IMPLANT
DRAIN CHANNEL 19F RND (DRAIN) ×9 IMPLANT
DRAIN CONNECTOR BLAKE 1:1 (MISCELLANEOUS) ×3 IMPLANT
DRAPE CARDIOVASCULAR INCISE (DRAPES) ×3
DRAPE EXTREMITY T 121X128X90 (DISPOSABLE) ×3 IMPLANT
DRAPE HALF SHEET 40X57 (DRAPES) ×3 IMPLANT
DRAPE SRG 135X102X78XABS (DRAPES) ×3 IMPLANT
DRAPE WARM FLUID 44X44 (DRAPES) ×3 IMPLANT
DRSG AQUACEL AG ADV 3.5X10 (GAUZE/BANDAGES/DRESSINGS) ×3 IMPLANT
ELECT BLADE 4.0 EZ CLEAN MEGAD (MISCELLANEOUS) ×3
ELECT REM PT RETURN 9FT ADLT (ELECTROSURGICAL) ×6
ELECTRODE BLDE 4.0 EZ CLN MEGD (MISCELLANEOUS) ×3 IMPLANT
ELECTRODE REM PT RTRN 9FT ADLT (ELECTROSURGICAL) ×6 IMPLANT
FELT TEFLON 1X6 (MISCELLANEOUS) ×6 IMPLANT
GAUZE SPONGE 4X4 12PLY STRL (GAUZE/BANDAGES/DRESSINGS) ×6 IMPLANT
GAUZE SPONGE 4X4 12PLY STRL LF (GAUZE/BANDAGES/DRESSINGS) IMPLANT
GEL ULTRASOUND 20GR AQUASONIC (MISCELLANEOUS) ×3 IMPLANT
GLOVE BIO SURGEON STRL SZ 6.5 (GLOVE) IMPLANT
GLOVE BIO SURGEON STRL SZ7 (GLOVE) ×6 IMPLANT
GLOVE BIOGEL M STRL SZ7.5 (GLOVE) ×6 IMPLANT
GLOVE BIOGEL PI IND STRL 6 (GLOVE) IMPLANT
GLOVE BIOGEL PI IND STRL 6.5 (GLOVE) IMPLANT
GLOVE INDICATOR 7.5 STRL GRN (GLOVE) IMPLANT
GLOVE SURG SS PI 8.0 STRL IVOR (GLOVE) IMPLANT
GOWN STRL REUS W/ TWL LRG LVL3 (GOWN DISPOSABLE) ×12 IMPLANT
GOWN STRL REUS W/ TWL XL LVL3 (GOWN DISPOSABLE) ×6 IMPLANT
GOWN STRL REUS W/TWL LRG LVL3 (GOWN DISPOSABLE) ×15
GOWN STRL REUS W/TWL XL LVL3 (GOWN DISPOSABLE) ×9
HEMOSTAT POWDER SURGIFOAM 1G (HEMOSTASIS) ×6 IMPLANT
INSERT FOGARTY XLG (MISCELLANEOUS) IMPLANT
INSERT SUTURE HOLDER (MISCELLANEOUS) ×3 IMPLANT
KIT BASIN OR (CUSTOM PROCEDURE TRAY) ×3 IMPLANT
KIT SUCTION CATH 14FR (SUCTIONS) ×3 IMPLANT
KIT TURNOVER KIT B (KITS) ×3 IMPLANT
KIT VASOVIEW HEMOPRO 2 VH 4000 (KITS) ×3 IMPLANT
LEAD PACING MYOCARDI (MISCELLANEOUS) ×3 IMPLANT
MARKER GRAFT CORONARY BYPASS (MISCELLANEOUS) ×9 IMPLANT
NS IRRIG 1000ML POUR BTL (IV SOLUTION) ×15 IMPLANT
PACK E OPEN HEART (SUTURE) ×3 IMPLANT
PACK OPEN HEART (CUSTOM PROCEDURE TRAY) ×3 IMPLANT
PAD ARMBOARD 7.5X6 YLW CONV (MISCELLANEOUS) ×6 IMPLANT
PAD ELECT DEFIB RADIOL ZOLL (MISCELLANEOUS) ×3 IMPLANT
PENCIL BUTTON HOLSTER BLD 10FT (ELECTRODE) ×3 IMPLANT
POSITIONER HEAD DONUT 9IN (MISCELLANEOUS) ×3 IMPLANT
PUNCH AORTIC ROTATE 4.0MM (MISCELLANEOUS) ×3 IMPLANT
SET MPS 3-ND DEL (MISCELLANEOUS) IMPLANT
SHEARS HARMONIC 9CM CVD (BLADE) ×3 IMPLANT
SPONGE T-LAP 18X18 ~~LOC~~+RFID (SPONGE) IMPLANT
SUPPORT HEART JANKE-BARRON (MISCELLANEOUS) ×3 IMPLANT
SUT BONE WAX W31G (SUTURE) ×3 IMPLANT
SUT ETHIBOND X763 2 0 SH 1 (SUTURE) ×6 IMPLANT
SUT MNCRL AB 3-0 PS2 18 (SUTURE) ×6 IMPLANT
SUT MNCRL AB 4-0 PS2 18 (SUTURE) IMPLANT
SUT PDS AB 1 CTX 36 (SUTURE) ×6 IMPLANT
SUT PROLENE 3 0 SH DA (SUTURE) IMPLANT
SUT PROLENE 4 0 RB 1 (SUTURE) ×12
SUT PROLENE 4 0 SH DA (SUTURE) ×3 IMPLANT
SUT PROLENE 4-0 RB1 .5 CRCL 36 (SUTURE) IMPLANT
SUT PROLENE 5 0 C 1 36 (SUTURE) ×9 IMPLANT
SUT PROLENE 7 0 BV 1 (SUTURE) IMPLANT
SUT PROLENE 7 0 BV1 MDA (SUTURE) ×3 IMPLANT
SUT STEEL 6MS V (SUTURE) ×6 IMPLANT
SUT VIC AB 1 CTX 36 (SUTURE) ×3
SUT VIC AB 1 CTX36XBRD ANBCTR (SUTURE) IMPLANT
SUT VIC AB 2-0 CT1 27 (SUTURE) ×6
SUT VIC AB 2-0 CT1 TAPERPNT 27 (SUTURE) IMPLANT
SUT VIC AB 3-0 SH 27 (SUTURE)
SUT VIC AB 3-0 SH 27X BRD (SUTURE) IMPLANT
SUT VIC AB 3-0 X1 27 (SUTURE) IMPLANT
SYR 50ML SLIP (SYRINGE) IMPLANT
SYSTEM SAHARA CHEST DRAIN ATS (WOUND CARE) ×3 IMPLANT
TAPE CLOTH SURG 4X10 WHT LF (GAUZE/BANDAGES/DRESSINGS) IMPLANT
TAPE PAPER 2X10 WHT MICROPORE (GAUZE/BANDAGES/DRESSINGS) IMPLANT
TOWEL GREEN STERILE (TOWEL DISPOSABLE) ×3 IMPLANT
TOWEL GREEN STERILE FF (TOWEL DISPOSABLE) ×3 IMPLANT
TRAY FOLEY SLVR 16FR TEMP STAT (SET/KITS/TRAYS/PACK) ×3 IMPLANT
TUBING LAP HI FLOW INSUFFLATIO (TUBING) ×3 IMPLANT
UNDERPAD 30X36 HEAVY ABSORB (UNDERPADS AND DIAPERS) ×3 IMPLANT
WATER STERILE IRR 1000ML POUR (IV SOLUTION) ×6 IMPLANT

## 2023-01-17 NOTE — Progress Notes (Signed)
[address]       [place] [address]             [phone]                                        [name] [facility] Medical Record #[id number] Date of Birth: Jul 05, 1965   Referring: Greta Doom, MD Primary Care: Greta Doom, MD Primary Cardiologist:None   Chief Complaint:        Chief Complaint  Patient presents with   Coronary Artery Disease      New patient consultation CATH 7/3, ECHO 5/9      History of Present Illness:     Christopher [name] 57 y.o. 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  He was evaluated for exertional anginal symptoms.  He denies any rest pain, or orthopnea     Past Medical and Surgical History: Previous Chest Surgery: no Previous Chest Radiation: no Diabetes Mellitus: no.  HbA1C pending Creatinine:  Recent Labs       Lab Results  Component Value Date    CREATININE 1.01 10/31/2017    CREATININE 0.96 09/10/2017    CREATININE 0.94 05/12/2016              Past Medical History:  Diagnosis Date   Allergy     Back pain     Glaucoma     Hypertension     MRSA carrier     Sleep apnea      On CPAP               Past Surgical History:  Procedure Laterality Date   carple tunnel in both hands        CERVICAL FUSION       COLONOSCOPY       TONSILLECTOMY AND ADENOIDECTOMY              Social History:   Tobacco Use History  Social History       Tobacco Use  Smoking Status Never  Smokeless Tobacco Never      Social History        Substance and Sexual Activity  Alcohol Use No   Alcohol/week: 0.0 standard drinks of alcohol        Allergies      Allergies  Allergen Reactions   Biaxin [Clarithromycin] Shortness Of Breath   Lidocaine Nausea Only                  Current Outpatient Medications  Medication Sig Dispense Refill   amLODipine (NORVASC) 5 MG tablet Take 5 mg by mouth daily.       aspirin 81 MG tablet Take 81 mg by mouth daily.        clotrimazole-betamethasone (LOTRISONE) cream Apply 1 application topically 2 (two) times daily. 30 g 0   cyclobenzaprine (FLEXERIL) 5 MG tablet Take 1 tablet (5 mg total) by mouth 3 (three) times daily. (Patient taking differently: Take 5 mg by mouth as needed.) 270 tablet 1   losartan (COZAAR) 100 MG tablet Take 100 mg by mouth daily.       nitroGLYCERIN (NITROSTAT) 0.3 MG SL tablet Place 1 tablet (0.3 mg total) under the tongue every 5 (five) minutes as needed for chest pain. 90 tablet 0   omega-3 acid ethyl esters (LOVAZA) 1 g capsule Take 1  capsule (1 g total) by mouth 2 (two) times daily. 180 capsule 3   travoprost, benzalkonium, (TRAVATAN) 0.004 % ophthalmic solution 1 drop at bedtime.          No current facility-administered medications for this visit.         (Not in a hospital admission)              Family History  Problem Relation Age of Onset   Hyperlipidemia Father     Cancer Maternal Grandfather     Diabetes Paternal Grandmother     Heart disease Paternal Grandmother     Colon cancer Neg Hx     Esophageal cancer Neg Hx     Rectal cancer Neg Hx     Stomach cancer Neg Hx              Review of Systems:    Review of Systems  Constitutional:  Positive for malaise/fatigue.  Respiratory:  Positive for shortness of breath.   Cardiovascular:  Positive for chest pain. Negative for palpitations and orthopnea.                   Physical Exam: BP 117/85 (BP Location: Left Arm, Patient Position: Sitting, Cuff Size: Normal)   Pulse 66   Resp 20   Ht 5\' 5"  (1.651 m)   Wt 225 lb 3.2 oz (102.2 kg)   SpO2 98% Comment: RA  BMI 37.48 kg/m  Physical Exam Constitutional:      General: He is not in acute distress.    Appearance: He is not ill-appearing.  HENT:     Head: Normocephalic and atraumatic.  Eyes:     Extraocular Movements: Extraocular movements intact.  Cardiovascular:     Rate and Rhythm: Normal rate.  Pulmonary:     Effort: Pulmonary effort is  normal. No respiratory distress.  Abdominal:     General: There is no distension.  Musculoskeletal:        General: Normal range of motion.     Cervical back: Normal range of motion.  Skin:    General: Skin is warm and dry.  Neurological:     General: No focal deficit present.     Mental Status: He is alert and oriented to person, place, and time.            I have independently reviewed the above radiologic studies and discussed with the patient    Recent Lab Findings: Recent Labs       Lab Results  Component Value Date    WBC 7.2 10/31/2017    HGB 16.0 10/31/2017    HCT 46.5 10/31/2017    PLT 319 10/31/2017    GLUCOSE 95 10/31/2017    CHOL 214 (H) 10/31/2017    TRIG 165 (H) 10/31/2017    HDL 38 (L) 10/31/2017    LDLCALC 143 (H) 10/31/2017    ALT 92 (H) 05/12/2016    AST 40 05/12/2016    NA 140 10/31/2017    K 4.3 10/31/2017    CL 103 10/31/2017    CREATININE 1.01 10/31/2017    BUN 12 10/31/2017    CO2 22 10/31/2017    HGBA1C 5.5 10/31/2017            Assessment / Plan:   57yo male with 3V CAD.  Echo shows no significant valvular disease.  On review of his LHC, his vessels are small, but he does have targets on the LAD, diagonal, PDA, and OM1.  We discussed the risks and benefits of CABG with a left radial artery harvest.  He is agreeable to proceed.

## 2023-01-17 NOTE — Anesthesia Procedure Notes (Signed)
Procedure Name: Intubation Date/Time: 01/17/2023 8:57 AM  Performed by: Owens Loffler, RNPre-anesthesia Checklist: Patient identified, Emergency Drugs available, Suction available and Patient being monitored Patient Re-evaluated:Patient Re-evaluated prior to induction Oxygen Delivery Method: Circle system utilized Preoxygenation: Pre-oxygenation with 100% oxygen Induction Type: IV induction Ventilation: Two handed mask ventilation required, Oral airway inserted - appropriate to patient size and Mask ventilation with difficulty Laryngoscope Size: Glidescope and 4 Grade View: Grade I Tube type: Oral Tube size: 8.0 mm Number of attempts: 1 Airway Equipment and Method: Stylet, Oral airway and Video-laryngoscopy Placement Confirmation: ETT inserted through vocal cords under direct vision, positive ETCO2 and breath sounds checked- equal and bilateral Secured at: 23 cm Tube secured with: Tape Dental Injury: Teeth and Oropharynx as per pre-operative assessment  Difficulty Due To: Difficulty was anticipated, Difficult Airway- due to large tongue and Difficult Airway- due to reduced neck mobility Future Recommendations: Recommend- induction with short-acting agent, and alternative techniques readily available Comments: Mask ventilation difficult; oral airway inserted and two-handed masking required still with difficulty.

## 2023-01-17 NOTE — Anesthesia Preprocedure Evaluation (Addendum)
Anesthesia Evaluation  Patient identified by MRN, date of birth, ID band Patient awake    Reviewed: Allergy & Precautions, NPO status , Patient's Chart, lab work & pertinent test results  Airway Mallampati: III  TM Distance: >3 FB Neck ROM: Full    Dental  (+) Teeth Intact, Dental Advisory Given   Pulmonary sleep apnea and Continuous Positive Airway Pressure Ventilation    breath sounds clear to auscultation       Cardiovascular hypertension, Pt. on medications and Pt. on home beta blockers + angina  + CAD   Rhythm:Regular Rate:Normal     Neuro/Psych negative neurological ROS  negative psych ROS   GI/Hepatic Neg liver ROS,GERD  ,,  Endo/Other  negative endocrine ROS    Renal/GU negative Renal ROS     Musculoskeletal negative musculoskeletal ROS (+)    Abdominal   Peds  Hematology negative hematology ROS (+)   Anesthesia Other Findings   Reproductive/Obstetrics                             Anesthesia Physical Anesthesia Plan  ASA: 4  Anesthesia Plan: General   Post-op Pain Management:    Induction: Intravenous  PONV Risk Score and Plan: 2 and Ondansetron and Midazolam  Airway Management Planned: Oral ETT  Additional Equipment: Arterial line, CVP, PA Cath, TEE and Ultrasound Guidance Line Placement  Intra-op Plan:   Post-operative Plan: Post-operative intubation/ventilation  Informed Consent: I have reviewed the patients History and Physical, chart, labs and discussed the procedure including the risks, benefits and alternatives for the proposed anesthesia with the patient or authorized representative who has indicated his/her understanding and acceptance.     Dental advisory given  Plan Discussed with: CRNA  Anesthesia Plan Comments:         Anesthesia Quick Evaluation

## 2023-01-17 NOTE — Op Note (Signed)
[address]       [place] [address]             [phone]                                          01/17/2023 Patient:  Knox Royalty Pre-Op Dx: 3V CAD HTN Obesity HLP   Post-op Dx:  same Procedure: CABG X 4.  LIMA LAD, RSVG PLV and diagonal, radial artery to OM3   Endoscopic greater saphenous vein harvest on the right Left radial artery harvest   Surgeon and Role:      * Zander Ingham, Eliezer Lofts, MD - Primary    * D.Zimmerman , PA-C - assisting An experienced assistant was required given the complexity of this surgery and the standard of surgical care. The assistant was needed for exposure, dissection, suctioning, retraction of delicate tissues and sutures, instrument exchange and for overall help during this procedure.    Anesthesia  general EBL:  Blood Administration: none Xclamp Time:  63 min Pump Time:   Drains: 19 F blake drain: R, L, mediastinal  Wires: ventricular Counts: correct   Indications: 57yo male with 3V CAD. Echo shows no significant valvular disease. On review of his LHC, his vessels are small, but he does have targets on the LAD, diagonal, PDA, and OM1. We discussed the risks and benefits of CABG with a left radial artery harvest. He is agreeable to proceed.  Findings: Good vein, LIMA, and radial.  Good LAD.  Very small diagonal.  PDA was too small for bypass.  PLV was used.  No good targets on the lateral wall.  Small OM3.  Operative Technique: All invasive lines were placed in pre-op holding.  After the risks, benefits and alternatives were thoroughly discussed, the patient was brought to the operative theatre.  Anesthesia was induced, and the patient was prepped and draped in normal sterile fashion.  An appropriate surgical pause was performed, and pre-operative antibiotics were dosed accordingly.  We began with simultaneous incisions along the right leg for harvesting of the greater saphenous vein, the left arm  for the radial artery and the chest for the sternotomy.  In regards to the sternotomy, this was carried down with bovie cautery, and the sternum was divided with a reciprocating saw.  Meticulous hemostasis was obtained.  The left internal thoracic artery was exposed and harvested in in pedicled fashion.  The patient was systemically heparinized, and the artery was divided distally, and placed in a papaverine sponge.    The sternal elevator was removed, and a retractor was placed.  The pericardium was divided in the midline and fashioned into a cradle with pericardial stitches.   After we confirmed an appropriate ACT, the ascending aorta was cannulated in standard fashion.  The right atrial appendage was used for venous cannulation site.  Cardiopulmonary bypass was initiated, and the heart retractor was placed. The cross clamp was applied, and a dose of anterograde cardioplegia was given with good arrest of the heart.  We moved to the posterior wall of the heart, and found a good target on the PLV.  An arteriotomy was made, and the vein graft was anastomosed to it in an end to side fashion.  Next we exposed the lateral wall, and found a good target on the OM3.  An end to side anastomosis  with the radial artery graft was then created.  Next, we exposed the anterior wall of the heart and identified a good target on diagonal.   An arteriotomy was created.  The vein was anastomosed in an end to side fashion.  Finally, we exposed a good target on the LAD, and fashioned an end to side anastomosis between it and the LITA.  We began to re-warm, and a re-animation dose of cardioplegia was given.  The heart was de-aired, and the cross clamp was removed.  Meticulous hemostasis was obtained.    A partial occludding clamp was then placed on the ascending aorta, and we created an end to side anastomosis between it and the proximal vein grafts.  Rings were placed on the proximal anastomosis.  The radial artery was jumped off  the hood of the right sided vein graft.  Hemostasis was obtained, and we separated from cardiopulmonary bypass without event.  The heparin was reversed with protamine.  Chest tubes and wires were placed, and the sternum was re-approximated with sternal wires.  The soft tissue and skin were re-approximated wth absorbable suture.    The patient tolerated the procedure without any immediate complications, and was transferred to the ICU in guarded condition.  Maleena Eddleman Keane Scrape

## 2023-01-17 NOTE — Transfer of Care (Signed)
Immediate Anesthesia Transfer of Care Note  Patient: Christopher [name]  Procedure(s) Performed: CORONARY ARTERY BYPASS GRAFTING (CABG) x 4 USING LEFT INTERNAL MAMMARY ARTERY AND ENDOSCOPICALLY HARVESTED RIGHT GREATER SAPHENOUS VEIN (Chest) RADIAL ARTERY HARVEST (Left: Arm Lower) TRANSESOPHAGEAL ECHOCARDIOGRAM  Patient Location: PACU  Anesthesia Type:General  Level of Consciousness: sedated and Patient remains intubated per anesthesia plan  Airway & Oxygen Therapy: Patient remains intubated per anesthesia plan and Patient placed on Ventilator (see vital sign flow sheet for setting)  Post-op Assessment: Report given to RN and Post -op Vital signs reviewed and stable  Post vital signs: Reviewed and stable  Last Vitals: see postop ICU VS flowsheet Vitals Value Taken Time  BP    Temp    Pulse    Resp    SpO2      Last Pain:  Vitals:   01/17/23 0709  TempSrc:   PainSc: 0-No pain      Patients Stated Pain Goal: 0 (01/17/23 0709)  Complications:  Encounter Notable Events  Notable Event Outcome Phase Comment  Difficult to intubate - expected  Intraprocedure Filed from anesthesia note documentation.

## 2023-01-17 NOTE — Progress Notes (Signed)
      [address]       [place] [address]             [phone]      S/p CABG x 4, Left radial  Intubated, sedated currently  BP 134/86   Pulse 83   Temp 98.1 F (36.7 C)   Resp 18   Ht 5\' 5"  (1.651 m)   Wt 101.7 kg   SpO2 99%   BMI 37.33 kg/m  CI 2.8 by Flotrac CVP 2   Intake/Output Summary (Last 24 hours) at 01/17/2023 1739 Last data filed at 01/17/2023 1600 Gross per 24 hour  Intake 3122.99 ml  Output 2220 ml  Net 902.99 ml   K 3.7 Hct 29  Wean to extubate  Viviann Spare C. Dorris Fetch, MD [facility] [phone]

## 2023-01-17 NOTE — Anesthesia Procedure Notes (Signed)
Central Venous Catheter Insertion Performed by: Shelton Silvas, MD, anesthesiologist Start/End8/21/2024 8:05 AM, 01/17/2023 8:07 AM Patient location: Pre-op. Preanesthetic checklist: patient identified, IV checked, site marked, risks and benefits discussed, surgical consent, monitors and equipment checked, pre-op evaluation, timeout performed and anesthesia consent Lidocaine 1% used for infiltration and patient sedated Hand hygiene performed  and maximum sterile barriers used  Total catheter length 16. Central line was placed.Triple lumen Procedure performed using ultrasound guided technique. Ultrasound Notes:image(s) printed for medical record Attempts: 1 Following insertion, dressing applied and line sutured. Post procedure assessment: blood return through all ports  Patient tolerated the procedure well with no immediate complications. Additional procedure comments: TLIC.

## 2023-01-17 NOTE — Anesthesia Procedure Notes (Signed)
Central Venous Catheter Insertion Performed by: Shelton Silvas, MD, anesthesiologist Start/End8/21/2024 7:55 AM, 01/17/2023 8:05 AM Patient location: Pre-op. Preanesthetic checklist: patient identified, IV checked, site marked, risks and benefits discussed, surgical consent, monitors and equipment checked, pre-op evaluation, timeout performed and anesthesia consent Position: Trendelenburg Lidocaine 1% used for infiltration and patient sedated Hand hygiene performed , maximum sterile barriers used  and Seldinger technique used Catheter size: 8.5 Fr Total catheter length 10. Central line was placed.Sheath introducer Swan type:thermodilution PA Cath depth:50 Procedure performed using ultrasound guided technique. Ultrasound Notes:anatomy identified, needle tip was noted to be adjacent to the nerve/plexus identified, no ultrasound evidence of intravascular and/or intraneural injection and image(s) printed for medical record Attempts: 1 Following insertion, line sutured, dressing applied and Biopatch. Post procedure assessment: blood return through all ports, free fluid flow and no air  Patient tolerated the procedure well with no immediate complications.

## 2023-01-17 NOTE — Consult Note (Signed)
NAME:  Christopher [name], MRN:  [id number], DOB:  10/08/1965, LOS: 0 ADMISSION DATE:  01/17/2023, CONSULTATION DATE:  01/17/23 REFERRING MD:  Cliffton Asters CVTS, CHIEF COMPLAINT:  s/p CABG x4   History of Present Illness:  57 yo M PMH mvCAD, OSA, HTN, s/p C-spine fusion who presented 8/21 for planned CABG x4, with L rad artery harvest.  Admitted to ICU post op as planned, on MV.   Total pump- 115 Xclamp- 63 EBL- 500  Product- 250 cellsaver   Pertinent  Medical History  mvCAD OSA on CPAP Glaucoma HTN    Significant Hospital Events: Including procedures, antibiotic start and stop dates in addition to other pertinent events     Interim History / Subjective:  POD0 CABG x4   On minimal neo   Objective   Blood pressure 134/86, pulse 86, temperature 98.1 F (36.7 C), resp. rate 19, height 5\' 5"  (1.651 m), weight 101.7 kg, SpO2 100%. CVP:  [1 mmHg-8 mmHg] 2 mmHg CO:  [5.2 L/min-5.6 L/min] 5.6 L/min CI:  [2.5 L/min/m2-2.7 L/min/m2] 2.7 L/min/m2  Vent Mode: SIMV;PRVC;PSV FiO2 (%):  [50 %] 50 % Set Rate:  [16 bmp] 16 bmp Vt Set:  [490 mL] 490 mL PEEP:  [5 cmH20] 5 cmH20 Plateau Pressure:  [17 cmH20] 17 cmH20   Intake/Output Summary (Last 24 hours) at 01/17/2023 1538 Last data filed at 01/17/2023 1425 Gross per 24 hour  Intake 2625 ml  Output 1595 ml  Net 1030 ml   Filed Weights   01/17/23 0644  Weight: 101.7 kg    Examination: General: obese middle aged M intubated sedated  HENT: ETT secure anicteric sclera short neck  Lungs: CTAb symmetrical chest expansion mechanically ventilated  Cardiovascular: rr cap refill < 3 sec BUE BLE. Chest tube with small volume bloody output  Abdomen: soft round  Extremities: LUE harvest site dressed and wrapped, RLE harvest site dressed and wrapped  Neuro: sedated  GU: foley   Resolved Hospital Problem list     Assessment & Plan:  57 yo M with mv CAD who underwent planned CABG x 4 with L rad art harvest 8/21 with Dr. Cliffton Asters    S/p  CABG x 4 with L radial artery harvest  Mechanically ventilated // ETT in place Expected ABLA  OSA on CPAP Hx HTN P -CXR ABG -- giving 1amp bicarb based on this  -anticipate likely rapid wean pathway -VAP, pulm hygiene -noct CPAP when extubated -Lines tubes wires per CVTS -wean pressors per protocol -cardene gtt  -complete txa -ppx abx -insulin gtt  -statin  -multimodal analgesia  -follow post op labs  -strict I/O     Best Practice (right click and "Reselect all SmartList Selections" daily)   Diet/type: NPO DVT prophylaxis: not indicated GI prophylaxis: H2B and PPI Lines: Central line, Arterial Line, and yes and it is still needed Foley:  Yes, and it is still needed Code Status:  full code Last date of multidisciplinary goals of care discussion [per primary]  Labs   CBC: Recent Labs  Lab 01/17/23 1156 01/17/23 1251 01/17/23 1307 01/17/23 1358 01/17/23 1401  HGB 10.9* 9.5* 10.5* 10.9* 10.5*  HCT 32.0* 28.0* 30.7* 32.0* 31.0*  PLT  --   --  313  --   --     Basic Metabolic Panel: Recent Labs  Lab 01/17/23 0916 01/17/23 1115 01/17/23 1152 01/17/23 1156 01/17/23 1251 01/17/23 1358 01/17/23 1401  NA 141 138 138 138 136 140 139  K 4.2 4.5 5.2* 5.1 6.0* 5.0  5.0  CL 106 107  --  103 116*  --  107  GLUCOSE 102* 99  --  97 115*  --  120*  BUN 14 14  --  13 14  --  14  CREATININE 0.90 1.00  --  0.90 0.90  --  0.90   GFR: Estimated Creatinine Clearance: 101.8 mL/min (by C-G formula based on SCr of 0.9 mg/dL). No results for input(s): "PROCALCITON", "WBC", "LATICACIDVEN" in the last 168 hours.  Liver Function Tests: No results for input(s): "AST", "ALT", "ALKPHOS", "BILITOT", "PROT", "ALBUMIN" in the last 168 hours. No results for input(s): "LIPASE", "AMYLASE" in the last 168 hours. No results for input(s): "AMMONIA" in the last 168 hours.  ABG    Component Value Date/Time   PHART 7.323 (L) 01/17/2023 1358   PCO2ART 46.0 01/17/2023 1358   PO2ART 149 (H)  01/17/2023 1358   HCO3 23.9 01/17/2023 1358   TCO2 23 01/17/2023 1401   ACIDBASEDEF 2.0 01/17/2023 1358   O2SAT 99 01/17/2023 1358     Coagulation Profile: No results for input(s): "INR", "PROTIME" in the last 168 hours.  Cardiac Enzymes: No results for input(s): "CKTOTAL", "CKMB", "CKMBINDEX", "TROPONINI" in the last 168 hours.  HbA1C: Hgb A1c MFr Bld  Date/Time Value Ref Range Status  01/09/2023 10:25 AM 5.7 (H) 4.8 - 5.6 % Final    Comment:    (NOTE)         Prediabetes: 5.7 - 6.4         Diabetes: >6.4         Glycemic control for adults with diabetes: <7.0   10/31/2017 11:24 AM 5.5 4.8 - 5.6 % Final    Comment:             Prediabetes: 5.7 - 6.4          Diabetes: >6.4          Glycemic control for adults with diabetes: <7.0     CBG: Recent Labs  Lab 01/17/23 1515  GLUCAP 76    Review of Systems:   Unable to obtain, intubated and sedated   Past Medical History:  He,  has a past medical history of Allergy, Anginal pain (HCC), Back pain, Coronary artery disease, Fatty liver, GERD (gastroesophageal reflux disease), Glaucoma, Hypertension, MRSA carrier, and Sleep apnea.   Surgical History:   Past Surgical History:  Procedure Laterality Date   carple tunnel in both hands      CERVICAL FUSION     COLONOSCOPY     LAMINECTOMY  2022   TONSILLECTOMY AND ADENOIDECTOMY     TYMPANOSTOMY TUBE PLACEMENT Bilateral    "several times" as a child     Social History:   reports that he has never smoked. He has never used smokeless tobacco. He reports that he does not currently use alcohol. He reports that he does not use drugs.   Family History:  His family history includes Cancer in his maternal grandfather; Diabetes in his paternal grandmother; Heart disease in his paternal grandmother; Hyperlipidemia in his father. There is no history of Colon cancer, Esophageal cancer, Rectal cancer, or Stomach cancer.   Allergies Allergies  Allergen Reactions   Biaxin  [Clarithromycin] Shortness Of Breath   Lidocaine Nausea Only     Home Medications  Prior to Admission medications   Medication Sig Start Date End Date Taking? Authorizing Provider  amLODipine (NORVASC) 5 MG tablet Take 5 mg by mouth daily.   Yes [provider]  aspirin  81 MG tablet Take 81 mg by mouth daily.   Yes [provider]  chlorzoxazone (PARAFON) 500 MG tablet Take 500 mg by mouth 3 (three) times daily as needed for muscle spasms.   Yes [provider]  clobetasol ointment (TEMOVATE) 0.05 % Apply 1 Application topically 2 (two) times daily as needed (dermatitis flare).   Yes [provider]  latanoprost (XALATAN) 0.005 % ophthalmic solution Place 1 drop into both eyes at bedtime.   Yes [provider]  losartan (COZAAR) 100 MG tablet Take 100 mg by mouth daily. 07/21/20  Yes [provider]  meloxicam (MOBIC) 15 MG tablet Take 15 mg by mouth daily.   Yes [provider]  metoprolol succinate (TOPROL-XL) 50 MG 24 hr tablet Take 25 mg by mouth daily. 12/13/22  Yes [provider]  nitroGLYCERIN (NITROSTAT) 0.3 MG SL tablet Place 1 tablet (0.3 mg total) under the tongue every 5 (five) minutes as needed for chest pain. 12/15/22  Yes Lightfoot, Eliezer Lofts, MD  Polyvinyl Alcohol-Povidone (REFRESH OP) Place 1 drop into both eyes daily.   Yes [provider]  rosuvastatin (CRESTOR) 40 MG tablet Take 20 mg by mouth daily. 12/13/22  Yes [provider]  Simethicone (GAS-X PO) Take 2 tablets by mouth daily as needed (gas).   Yes [provider]  triamcinolone ointment (KENALOG) 0.1 % Apply 1 Application topically 2 (two) times daily as needed (dermatitis flare).   Yes [provider]     Critical care time: 21     CRITICAL CARE Performed by: Lanier Clam   Total critical care time: 36 minutes  Critical care time was exclusive of separately billable procedures and treating other  patients. Critical care was necessary to treat or prevent imminent or life-threatening deterioration.  Critical care was time spent personally by me on the following activities: development of treatment plan with patient and/or surrogate as well as nursing, discussions with consultants, evaluation of patient's response to treatment, examination of patient, obtaining history from patient or surrogate, ordering and performing treatments and interventions, ordering and review of laboratory studies, ordering and review of radiographic studies, pulse oximetry and re-evaluation of patient's condition.  Tessie Fass MSN, AGACNP-BC [facility] Pulmonary/Critical Care Medicine Amion for pager  01/17/2023, 3:38 PM

## 2023-01-17 NOTE — Anesthesia Procedure Notes (Signed)
Arterial Line Insertion Start/End8/21/2024 8:10 AM, 01/17/2023 8:15 AM Performed by: Shelton Silvas, MD, anesthesiologist  Patient location: Pre-op. Preanesthetic checklist: patient identified, IV checked, site marked, risks and benefits discussed, surgical consent, monitors and equipment checked, pre-op evaluation, timeout performed and anesthesia consent Lidocaine 1% used for infiltration Right, radial was placed Catheter size: 20 G Hand hygiene performed  and maximum sterile barriers used   Attempts: 1 Procedure performed without using ultrasound guided technique. Following insertion, dressing applied and Biopatch. Post procedure assessment: normal and unchanged  Patient tolerated the procedure well with no immediate complications.

## 2023-01-17 NOTE — Procedures (Signed)
Extubation Procedure Note  Patient Details:   Name: Christopher [name] DOB: 1965/09/21 MRN: [id number]   Airway Documentation:    Vent end date: 01/17/23 Vent end time: 1804   Evaluation  O2 sats: stable throughout Complications: No apparent complications Patient did tolerate procedure well. Bilateral Breath Sounds: Rhonchi   Yes Pt was extubated per CCM. Per CCM no NIF or VC needed. Pt is currently on BiPAP and seems to be tolerating well. On 10/5 40%. RT will continue to monitor.  Jolayne Panther 01/17/2023, 6:22 PM

## 2023-01-17 NOTE — Progress Notes (Signed)
  Echocardiogram Echocardiogram Transesophageal has been performed.  Christopher [name] 01/17/2023, 9:21 AM

## 2023-01-17 NOTE — Brief Op Note (Signed)
01/17/2023  1:03 PM  PATIENT:  Christopher [name]  57 y.o. male  PRE-OPERATIVE DIAGNOSIS:  Coronary Artery Disease  POST-OPERATIVE DIAGNOSIS:  Coronary Artery Disease  PROCEDURE: TRANSESOPHAGEAL ECHOCARDIOGRAM, CORONARY ARTERY BYPASS GRAFTING (CABG) x 4 (LIMA to LAD, SVG to DIAGONAL, LEFT RADIAL ARTERY to OM, SVG to PLB) USING LEFT INTERNAL MAMMARY ARTERY, LEFT RADIAL ARTERY HARVEST, AND ENDOSCOPICALLY HARVESTED RIGHT GREATER SAPHENOUS VEIN   Vein harvest time: 40 min Vein prep time: 14 min Left radial artery time: 35 minutes Radial artery prep time: 5 minutes  SURGEON:  Surgeons and Role:    Corliss Skains, MD - Primary  PHYSICIAN ASSISTANTS: 1. Jillyn Hidden PA-C 2. Doree Fudge PA-C  ASSISTANTS: Tanda Rockers RNFA   ANESTHESIA:   general  EBL:  Per perfusion, anesthesia record  DRAINS:  Chest tubes placed in the mediastinal and pleural spaces    COUNTS CORRECT:  YES  DICTATION: .Dragon Dictation  PLAN OF CARE: Admit to inpatient   PATIENT DISPOSITION:  ICU - intubated and hemodynamically stable.   Delay start of Pharmacological VTE agent (>24hrs) due to surgical blood loss or risk of bleeding: no  BASELINE WEIGHT: 101.7 kg

## 2023-01-18 ENCOUNTER — Telehealth: Payer: Self-pay

## 2023-01-18 ENCOUNTER — Inpatient Hospital Stay (HOSPITAL_COMMUNITY): Payer: No Typology Code available for payment source

## 2023-01-18 ENCOUNTER — Encounter (HOSPITAL_COMMUNITY): Payer: Self-pay | Admitting: Thoracic Surgery (Cardiothoracic Vascular Surgery)

## 2023-01-18 DIAGNOSIS — I25119 Atherosclerotic heart disease of native coronary artery with unspecified angina pectoris: Secondary | ICD-10-CM | POA: Diagnosis not present

## 2023-01-18 LAB — LIPID PANEL
Cholesterol: 61 mg/dL (ref 0–200)
HDL: 19 mg/dL — ABNORMAL LOW (ref >40)
LDL Cholesterol: 26 mg/dL (ref 0–99)
Total CHOL/HDL Ratio: 3.2 RATIO
Triglycerides: 79 mg/dL (ref <150)
VLDL: 16 mg/dL (ref 0–40)

## 2023-01-18 LAB — BASIC METABOLIC PANEL
Anion gap: 8 (ref 5–15)
Anion gap: 6 (ref 5–15)
BUN: 16 mg/dL (ref 6–20)
BUN: 19 mg/dL (ref 6–20)
CO2: 22 mmol/L (ref 22–32)
CO2: 24 mmol/L (ref 22–32)
Calcium: 8.2 mg/dL — ABNORMAL LOW (ref 8.9–10.3)
Calcium: 7.9 mg/dL — ABNORMAL LOW (ref 8.9–10.3)
Chloride: 105 mmol/L (ref 98–111)
Chloride: 103 mmol/L (ref 98–111)
Creatinine, Ser: 1.11 mg/dL (ref 0.61–1.24)
Creatinine, Ser: 1.21 mg/dL (ref 0.61–1.24)
GFR, Estimated: >60 mL/min (ref >60)
GFR, Estimated: >60 mL/min (ref >60)
Glucose, Bld: 129 mg/dL — ABNORMAL HIGH (ref 70–99)
Glucose, Bld: 123 mg/dL — ABNORMAL HIGH (ref 70–99)
Potassium: 4.3 mmol/L (ref 3.5–5.1)
Potassium: 4.7 mmol/L (ref 3.5–5.1)
Sodium: 135 mmol/L (ref 135–145)
Sodium: 133 mmol/L — ABNORMAL LOW (ref 135–145)

## 2023-01-18 LAB — MAGNESIUM
Magnesium: 2.7 mg/dL — ABNORMAL HIGH (ref 1.7–2.4)
Magnesium: 2.8 mg/dL — ABNORMAL HIGH (ref 1.7–2.4)

## 2023-01-18 LAB — GLUCOSE, CAPILLARY
Glucose-Capillary: 109 mg/dL — ABNORMAL HIGH (ref 70–99)
Glucose-Capillary: 119 mg/dL — ABNORMAL HIGH (ref 70–99)
Glucose-Capillary: 122 mg/dL — ABNORMAL HIGH (ref 70–99)
Glucose-Capillary: 124 mg/dL — ABNORMAL HIGH (ref 70–99)
Glucose-Capillary: 128 mg/dL — ABNORMAL HIGH (ref 70–99)
Glucose-Capillary: 130 mg/dL — ABNORMAL HIGH (ref 70–99)
Glucose-Capillary: 133 mg/dL — ABNORMAL HIGH (ref 70–99)
Glucose-Capillary: 135 mg/dL — ABNORMAL HIGH (ref 70–99)
Glucose-Capillary: 139 mg/dL — ABNORMAL HIGH (ref 70–99)
Glucose-Capillary: 141 mg/dL — ABNORMAL HIGH (ref 70–99)
Glucose-Capillary: 143 mg/dL — ABNORMAL HIGH (ref 70–99)
Glucose-Capillary: 58 mg/dL — ABNORMAL LOW (ref 70–99)

## 2023-01-18 LAB — CBC
HCT: 30.8 % — ABNORMAL LOW (ref 39.0–52.0)
HCT: 28.8 % — ABNORMAL LOW (ref 39.0–52.0)
Hemoglobin: 10.3 g/dL — ABNORMAL LOW (ref 13.0–17.0)
Hemoglobin: 9.3 g/dL — ABNORMAL LOW (ref 13.0–17.0)
MCH: 28.7 pg (ref 26.0–34.0)
MCH: 28.6 pg (ref 26.0–34.0)
MCHC: 33.4 g/dL (ref 30.0–36.0)
MCHC: 32.3 g/dL (ref 30.0–36.0)
MCV: 85.8 fL (ref 80.0–100.0)
MCV: 88.6 fL (ref 80.0–100.0)
Platelets: 278 10*3/uL (ref 150–400)
Platelets: 180 10*3/uL (ref 150–400)
RBC: 3.59 MIL/uL — ABNORMAL LOW (ref 4.22–5.81)
RBC: 3.25 MIL/uL — ABNORMAL LOW (ref 4.22–5.81)
RDW: 12.4 % (ref 11.5–15.5)
RDW: 12.8 % (ref 11.5–15.5)
WBC: 13.4 10*3/uL — ABNORMAL HIGH (ref 4.0–10.5)
WBC: 12 10*3/uL — ABNORMAL HIGH (ref 4.0–10.5)
nRBC: 0 % (ref 0.0–0.2)
nRBC: 0 % (ref 0.0–0.2)

## 2023-01-18 MED ORDER — KETOROLAC TROMETHAMINE 30 MG/ML IJ SOLN
30.0000 mg | Freq: Four times a day (QID) | INTRAMUSCULAR | Status: AC
  Administered 2023-01-18 – 2023-01-20 (×8): 30 mg via INTRAVENOUS
  Filled 2023-01-18 (×8): qty 1

## 2023-01-18 MED ORDER — AMLODIPINE BESYLATE 5 MG PO TABS
2.5000 mg | ORAL_TABLET | Freq: Every day | ORAL | Status: DC
  Administered 2023-01-18 – 2023-01-19 (×2): 2.5 mg via ORAL
  Filled 2023-01-18 (×3): qty 1

## 2023-01-18 MED ORDER — INSULIN ASPART 100 UNIT/ML IJ SOLN
0.0000 [IU] | INTRAMUSCULAR | Status: DC
  Administered 2023-01-18 – 2023-01-19 (×6): 2 [IU] via SUBCUTANEOUS

## 2023-01-18 MED ORDER — ROSUVASTATIN CALCIUM 20 MG PO TABS
40.0000 mg | ORAL_TABLET | Freq: Every day | ORAL | Status: DC
  Administered 2023-01-18 – 2023-01-23 (×6): 40 mg via ORAL
  Filled 2023-01-18 (×6): qty 2

## 2023-01-18 MED ORDER — ENOXAPARIN SODIUM 40 MG/0.4ML IJ SOSY: 40.0000 mg | PREFILLED_SYRINGE | Freq: Every day | INTRAMUSCULAR | Status: DC

## 2023-01-18 MED ORDER — ENOXAPARIN SODIUM 40 MG/0.4ML IJ SOSY
40.0000 mg | PREFILLED_SYRINGE | Freq: Every day | INTRAMUSCULAR | Status: DC
  Administered 2023-01-18 – 2023-01-22 (×5): 40 mg via SUBCUTANEOUS
  Filled 2023-01-18 (×5): qty 0.4

## 2023-01-18 MED ORDER — INSULIN ASPART 100 UNIT/ML IJ SOLN: 0.0000 [IU] | INTRAMUSCULAR | Status: DC

## 2023-01-18 NOTE — Progress Notes (Signed)
Pt was taken off BIPAP by nurse for short break, when attempting to place pt back on for night time use, pt stated it was hard to take a deep breath due to pain. He could not tolerate BIPAP and will rest on nasal cannula. BIPAP on standby for now

## 2023-01-18 NOTE — Progress Notes (Signed)
   NAME:  Christopher [name], MRN:  [id number], DOB:  [birth year], LOS: 1 ADMISSION DATE:  01/17/2023, CONSULTATION DATE:  01/17/23 REFERRING MD:  Cliffton Asters CVTS, CHIEF COMPLAINT:  s/p CABG x4   History of Present Illness:  57 yo M PMH mvCAD, OSA, HTN, s/p C-spine fusion who presented 8/21 for planned CABG x4, with L rad artery harvest.  Admitted to ICU post op as planned, on MV.   Total pump- 115 Xclamp- 63 EBL- 500  Product- 250 cellsaver   Pertinent  Medical History  mvCAD OSA on CPAP Glaucoma HTN    Significant Hospital Events: Including procedures, antibiotic start and stop dates in addition to other pertinent events   8/22 cabg x 4, radial harvest, extubation  Interim History / Subjective:  No events. Lots of pain trying BIPAP. Breathing is stable  Objective   Blood pressure 106/61, pulse 77, temperature 99.7 F (37.6 C), resp. rate 17, height 5\' 5"  (1.651 m), weight 106.3 kg, SpO2 93%. CVP:  [1 mmHg-17 mmHg] 7 mmHg CO:  [1.5 L/min-6.2 L/min] 5.9 L/min CI:  [0.7 L/min/m2-3 L/min/m2] 2.8 L/min/m2  Vent Mode: PSV;CPAP;BIPAP FiO2 (%):  [40 %-50 %] 40 % Set Rate:  [16 bmp-20 bmp] 20 bmp Vt Set:  [490 mL] 490 mL PEEP:  [5 cmH20] 5 cmH20 Pressure Support:  [5 cmH20] 5 cmH20 Plateau Pressure:  [17 cmH20] 17 cmH20   Intake/Output Summary (Last 24 hours) at 01/18/2023 0835 Last data filed at 01/18/2023 0800 Gross per 24 hour  Intake 6140.66 ml  Output 3095 ml  Net 3045.66 ml   Filed Weights   01/17/23 0644 01/18/23 0500  Weight: 101.7 kg 106.3 kg    Examination: No distress Tongue swelling better Sternotomy site dressed without strikethrough Moves to command RASS 0 L radial harvest site well approximated Lungs clear  CXR better than yesterday, more clear CBC stable BMP okay, negligible Cr increase  Resolved Hospital Problem list   Postop vent management Difficult airway  Assessment & Plan:  CABG x 4 w/ L radial harvest Postop vasoplegia- mix of this plus  CCB gtt for vasospasm ppx; question some hypovolemia as well with dropping CVP Postop pain control OSA on CPAP  - Cleviprex to amlodipine - Wean levo for MAP 65, maybe can tolerate a little more volume but will defer to lightfoot - Drain management per TCTS - Try CPAP at bedtime if tolerates - Multimodal pain control, encourage IS - So far doing well on usual postop pathway  31 min cc time  01/18/2023, 8:35 AM

## 2023-01-18 NOTE — Anesthesia Postprocedure Evaluation (Signed)
Anesthesia Post Note  Patient: Christopher [name]  Procedure(s) Performed: CORONARY ARTERY BYPASS GRAFTING (CABG) x 4 USING LEFT INTERNAL MAMMARY ARTERY AND ENDOSCOPICALLY HARVESTED RIGHT GREATER SAPHENOUS VEIN (Chest) RADIAL ARTERY HARVEST (Left: Arm Lower) TRANSESOPHAGEAL ECHOCARDIOGRAM     Patient location during evaluation: SICU Anesthesia Type: General Level of consciousness: sedated Pain management: pain level controlled Vital Signs Assessment: post-procedure vital signs reviewed and stable Respiratory status: patient remains intubated per anesthesia plan Cardiovascular status: stable Postop Assessment: no apparent nausea or vomiting Anesthetic complications: yes   Encounter Notable Events  Notable Event Outcome Phase Comment  Difficult to intubate - expected  Intraprocedure Filed from anesthesia note documentation.                 Shelton Silvas

## 2023-01-18 NOTE — Discharge Summary (Signed)
[address]       [place] [address]             [phone]    Physician Discharge Summary  Patient ID: Christopher [name] MRN: [id number] DOB/AGE: March 14, 1966 57 y.o.  Admit date: 01/17/2023 Discharge date: 01/23/2023  Admission Diagnoses:  Patient Active Problem List   Diagnosis Date Noted   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   Coronary artery disease 01/17/2023   CAD (coronary artery disease) 12/15/2022   Encounter for Investment banker, operational (FAA) examination 07/22/2018   IBS (irritable bowel syndrome) 10/18/2016   OSA on CPAP 08/31/2015   HTN (hypertension) 08/19/2015   History of MRSA infection 08/19/2015   Psoriasis 08/19/2015   Elevated cholesterol 08/19/2015   Obesity, Class II, BMI 35-39.9 08/19/2015   Chronic back pain 03/25/2015     Discharge Diagnoses:  Patient Active Problem List   Diagnosis Date Noted   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   Coronary artery disease 01/17/2023   CAD (coronary artery disease) 12/15/2022   Encounter for Investment banker, operational (FAA) examination 07/22/2018   IBS (irritable bowel syndrome) 10/18/2016   OSA on CPAP 08/31/2015   HTN (hypertension) 08/19/2015   History of MRSA infection 08/19/2015   Psoriasis 08/19/2015   Elevated cholesterol 08/19/2015   Obesity, Class II, BMI 35-39.9 08/19/2015   Chronic back pain 03/25/2015     Discharged Condition: stable  HPI: This is a 57 year old male with a past medical history of OSA (on CPAP) and hypertension who was evaluated by Dr. Cliffton Asters in the office for three vessel coronary artery disease. Dr. Cliffton Asters discussed the need for coronary artery bypass grafting surgery. Potential risks, benefits, and complications of the surgery were discussed with the patient and he agreed to proceed with surgery. Pre operative carotid duplex showed no significant internal carotid artery stenosis bilaterally.  Hospital Course: Patient underwent a CABG x 4. He was transported  from the OR to [facility] ICU in stable condition. He was extubated early evening of surgery. He has a history of OSA (unable to tolerate CPAP) and was put on bi pap. He did not tolerate this secondary to pain so put on [place]. He is on 4L [place] this am on post op day one. He is on Cardene and Levophed drips. He will be started on low dose Amlodipine and Cardene drip will be stopped. He was weaned off Levophed. A line and chest drains were removed.   He was transitioned off the Insulin drip. His pre op HGA1C is 5.7. He likely has pre diabetes. Accu checks and SS PRN will be stopped after transfer to the floor. We will provide nutrition information with discharge paperwork. He was ready for transfer to 4E [facility] on post-op day 2. Mobility and pulmonary hygiene were encouraged. He progressed to independent mobility.  He tolerated diet advancement and had return of normal bowel function. He had adequate pain control with oral medications. His incisions appeared to be healing with no sign of complication. He was ready for discharge on post-op day 6.  Consults: pulmonary/intensive care  Significant Diagnostic Studies:   CLINICAL DATA:  [id number].  Mid field postoperative check.   EXAM: CHEST - 2 VIEW   COMPARISON:  Portable chest yesterday at 4:13 a.m.   FINDINGS: 4:50 a.m.  Interval removal right IJ catheter.  No pneumothorax.   Mediastinal drain and bilateral chest tubes have been removed.   Intact sternotomy sutures are again noted  with CABG changes.   Stable mediastinum with superior widening. Mild cardiomegaly without vascular congestion.   There is stable atelectasis in the bases. No focal pneumonia is seen.   No substantial pleural effusion. There may be a minimal left pleural effusion. No new osseous findings.   IMPRESSION: 1. Interval removal of the right IJ catheter, mediastinal drain and bilateral chest tubes. No pneumothorax. 2. Stable mild cardiomegaly without vascular  congestion. 3. Stable bibasilar atelectasis.     Electronically Signed   By: Almira Bar M.D.   On: 01/20/2023 06:17   Treatments: surgery:  CABG X 4.  LIMA LAD, RSVG PLV and diagonal, radial artery to OM3   Endoscopic greater saphenous vein harvest on the right Left radial artery harvest by Dr. Cliffton Asters on 01/17/2023.  Discharge Exam: Blood pressure 112/71, pulse 97, temperature 98.9 F (37.2 C), temperature source Oral, resp. rate 16, height 5\' 5"  (1.651 m), weight 104.9 kg, SpO2 98%.  General appearance: alert, cooperative, and mild distress Neurologic: intact Heart: NSR, no arrhythmias Lungs: breath sounds full and clear.  Abdomen: soft, no tenderness Extremities: well perfused, incisions intact and dry Wound: the sternotomy incision is intact and dry  Discharge Medications:  The patient has been discharged on:   1.Beta Blocker:  Yes [ x  ]                              No   [   ]                              If No, reason:  2.Ace Inhibitor/ARB: Yes [   ]                                     No  [  x  ]                                     If No, reason: Marginal BP on metoprolol and amlodipine  3.Statin:   Yes [ x  ]                  No  [   ]                  If No, reason:  4.Ecasa:  Yes  [ x  ]                  No   [   ]                  If No, reason:  Patient had ACS upon admission:  no  Plavix/P2Y12 inhibitor: Yes [   ]                                      No  [ x ]      Allergies as of 01/23/2023       Reactions   Biaxin [clarithromycin] Shortness Of Breath   Lidocaine Nausea Only        Medication List     STOP taking these medications    aspirin 81  MG tablet   losartan 100 MG tablet Commonly known as: COZAAR   meloxicam 15 MG tablet Commonly known as: MOBIC       TAKE these medications    acetaminophen 500 MG tablet Commonly known as: TYLENOL Take 2 tablets (1,000 mg total) by mouth 4 (four) times daily.    amLODipine 2.5 MG tablet Commonly known as: NORVASC Take 1 tablet (2.5 mg total) by mouth daily. What changed:  medication strength how much to take   chlorzoxazone 500 MG tablet Commonly known as: PARAFON Take 500 mg by mouth 3 (three) times daily as needed for muscle spasms.   clobetasol ointment 0.05 % Commonly known as: TEMOVATE Apply 1 Application topically 2 (two) times daily as needed (dermatitis flare).   furosemide 40 MG tablet Commonly known as: LASIX Take 1 tablet (40 mg total) by mouth daily.   GAS-X PO Take 2 tablets by mouth daily as needed (gas).   latanoprost 0.005 % ophthalmic solution Commonly known as: XALATAN Place 1 drop into both eyes at bedtime.   metoprolol succinate 50 MG 24 hr tablet Commonly known as: TOPROL-XL Take 25 mg by mouth daily.   nitroGLYCERIN 0.3 MG SL tablet Commonly known as: Nitrostat Place 1 tablet (0.3 mg total) under the tongue every 5 (five) minutes as needed for chest pain.   oxyCODONE 5 MG immediate release tablet Commonly known as: Oxy IR/ROXICODONE Take 1 tablet (5 mg total) by mouth every 4 (four) hours as needed for up to 7 days for severe pain.   REFRESH OP Place 1 drop into both eyes daily.   rosuvastatin 40 MG tablet Commonly known as: CRESTOR Take 1 tablet (40 mg total) by mouth daily. What changed: how much to take   triamcinolone ointment 0.1 % Commonly known as: KENALOG Apply 1 Application topically 2 (two) times daily as needed (dermatitis flare).        Follow-up Information     Lightfoot, Eliezer Lofts, MD Follow up.   Specialty: Cardiothoracic Surgery Why: Please do NOT come to the office. Dr. Cliffton Asters will call you on 08/30 at 3:00 pm Contact information: [address] [place] [address] [phone]         Greta Doom, MD. Call.   Specialty: Internal Medicine Why: for a follow up regarding further surveillnace of HGA1C 5.7 (pre diabetes) Contact information: [address]  Ronney Asters [place] [address] [phone]         Center, [facility]. Go on 02/20/2023.   Why: Appointment time is at 1:30 pm with Dr. Aleene Davidson. May contact [phone] for any questions about follow up appointment Contact information: [address] [place] [address] [phone]         [facility] Follow up.   Why: Home Health RN -will call to arrange visit Contact information: [phone]                Signed:  Leary Roca, PA-C 01/23/2023, 8:30 AM

## 2023-01-18 NOTE — Progress Notes (Addendum)
TCTS DAILY ICU PROGRESS NOTE                   [address]            [place] [address]          [phone]   1 Day Post-Op Procedure(s) (LRB): CORONARY ARTERY BYPASS GRAFTING (CABG) x 4 USING LEFT INTERNAL MAMMARY ARTERY AND ENDOSCOPICALLY HARVESTED RIGHT GREATER SAPHENOUS VEIN (N/A) RADIAL ARTERY HARVEST (Left) TRANSESOPHAGEAL ECHOCARDIOGRAM (N/A)  Total Length of Stay:  LOS: 1 day   Subjective: Patient sitting in chair. "It hurts to take a deep breath".   Objective: Vital signs in last 24 hours: Temp:  [97.7 F (36.5 C)-101.1 F (38.4 C)] 101.1 F (38.4 C) (08/21 2300) Pulse Rate:  [62-99] 82 (08/22 0700) Cardiac Rhythm: Normal sinus rhythm (08/22 0400) Resp:  [7-32] 21 (08/22 0700) BP: (88-118)/(55-84) 105/64 (08/22 0700) SpO2:  [86 %-100 %] 94 % (08/22 0700) Arterial Line BP: (56-208)/(15-201) 107/52 (08/22 0700) FiO2 (%):  [40 %-50 %] 40 % (08/21 2000) Weight:  [106.3 kg] 106.3 kg (08/22 0500)  Filed Weights   01/17/23 0644 01/18/23 0500  Weight: 101.7 kg 106.3 kg    Weight change: 4.558 kg   Hemodynamic parameters for last 24 hours: CVP:  [1 mmHg-17 mmHg] 8 mmHg CO:  [1.5 L/min-6.2 L/min] 5.9 L/min CI:  [0.7 L/min/m2-3 L/min/m2] 2.8 L/min/m2  Intake/Output from previous day: 08/21 0701 - 08/22 0700 In: 5792.5 [I.V.:3327.4; Blood:225; IV Piggyback:2240.1] Out: 3095 [Urine:2305; Blood:500; Chest Tube:290]  Intake/Output this shift: No intake/output data recorded.  Current Meds: Scheduled Meds:  acetaminophen  1,000 mg Oral Q6H   Or   acetaminophen (TYLENOL) oral liquid 160 mg/5 mL  1,000 mg Per Tube Q6H   aspirin EC  325 mg Oral Daily   Or   aspirin  324 mg Per Tube Daily   bisacodyl  10 mg Oral Daily   Or   bisacodyl  10 mg Rectal Daily   Chlorhexidine Gluconate Cloth  6 each Topical Daily   docusate sodium  200 mg Oral Daily   insulin aspart  0-24 Units Subcutaneous Q4H   latanoprost  1 drop Both Eyes QHS   metoCLOPramide  (REGLAN) injection  10 mg Intravenous Q6H   metoprolol tartrate  12.5 mg Oral BID   Or   metoprolol tartrate  12.5 mg Per Tube BID   [START ON 01/19/2023] pantoprazole  40 mg Oral Daily   pantoprazole (PROTONIX) IV  40 mg Intravenous QHS   rosuvastatin  20 mg Oral Daily   sodium chloride flush  3 mL Intravenous Q12H   Continuous Infusions:  sodium chloride 20 mL/hr at 01/18/23 0600   sodium chloride     sodium chloride     albumin human Stopped (01/17/23 2101)    ceFAZolin (ANCEF) IV 2 g (01/18/23 2956)   dexmedetomidine (PRECEDEX) IV infusion Stopped (01/18/23 0502)   lactated ringers     lactated ringers 20 mL/hr at 01/18/23 0646   methocarbamol (ROBAXIN) IV Stopped (01/17/23 2251)   niCARDipine 5 mg/hr (01/18/23 0611)   norepinephrine (LEVOPHED) Adult infusion 12 mcg/min (01/18/23 0600)   phenylephrine (NEO-SYNEPHRINE) Adult infusion Stopped (01/18/23 0131)   PRN Meds:.sodium chloride, albumin human, HYDROmorphone (DILAUDID) injection, metoprolol tartrate, midazolam, morphine injection, ondansetron (ZOFRAN) IV, oxyCODONE, polyvinyl alcohol, sodium chloride flush, traMADol  General appearance: alert, cooperative, and no distress Neurologic: intact Heart: RRR Lungs: Diminished breath sounds bilaterally Abdomen: Soft, obese, sporadic bowel sounds Extremities: Bilateral  LE edema. LUE motor/sensory intact Wound: Aquacel intact on sternal wound. LUE wound is clean and dry. RLE wounds are clean and dry.  Lab Results: CBC: Recent Labs    01/17/23 1953 01/17/23 2133 01/18/23 0358  WBC 16.6*  --  13.4*  HGB 10.9* 9.9* 10.3*  HCT 32.3* 29.0* 30.8*  PLT 314  --  278   BMET:  Recent Labs    01/17/23 1953 01/17/23 2133 01/18/23 0358  NA 142 139 135  K 3.9 4.8 4.3  CL 112*  --  105  CO2 20*  --  22  GLUCOSE 118*  --  129*  BUN 12  --  16  CREATININE 0.93  --  1.11  CALCIUM 7.1*  --  8.2*    CMET: Lab Results  Component Value Date   WBC 13.4 (H) 01/18/2023   HGB 10.3  (L) 01/18/2023   HCT 30.8 (L) 01/18/2023   PLT 278 01/18/2023   GLUCOSE 129 (H) 01/18/2023   CHOL 214 (H) 10/31/2017   TRIG 165 (H) 10/31/2017   HDL 38 (L) 10/31/2017   LDLCALC 143 (H) 10/31/2017   ALT 61 (H) 01/09/2023   AST 50 (H) 01/09/2023   NA 135 01/18/2023   K 4.3 01/18/2023   CL 105 01/18/2023   CREATININE 1.11 01/18/2023   BUN 16 01/18/2023   CO2 22 01/18/2023   INR 1.4 (H) 01/17/2023   HGBA1C 5.7 (H) 01/09/2023      PT/INR:  Recent Labs    01/17/23 1515  LABPROT 17.3*  INR 1.4*   Radiology: [facility] Chest Port 1 View  Result Date: 01/17/2023 CLINICAL DATA:  Status post open heart surgery. EXAM: PORTABLE CHEST 1 VIEW COMPARISON:  01/09/2023 FINDINGS: Interval post CABG changes. Right jugular catheter with its tip in the region of the proximal superior vena cava. Interval endotracheal tube with its tip located 1.9 cm above the carina. This could be retracted 3 cm for better positioning. Interval nasogastric tube with its tip and side hole in the stomach. Mediastinal and left pleural drainage catheters. Very poor inspiration with associated magnification of the cardiac silhouette. Interval dense left lower lobe airspace opacity. The remainder of the lungs remain clear. No pneumothorax seen. No pleural fluid. Unremarkable bones. IMPRESSION: 1. Interval post CABG changes. 2. Interval endotracheal tube with its tip located 1.9 cm above the carina. This could be retracted 3 cm for better positioning. 3. Interval dense left lower lobe atelectasis. Electronically Signed   By: Beckie Salts M.D.   On: 01/17/2023 16:27   EP STUDY  Result Date: 01/17/2023 See surgical note for result.    Assessment/Plan: S/P Procedure(s) (LRB): CORONARY ARTERY BYPASS GRAFTING (CABG) x 4 USING LEFT INTERNAL MAMMARY ARTERY AND ENDOSCOPICALLY HARVESTED RIGHT GREATER SAPHENOUS VEIN (N/A) RADIAL ARTERY HARVEST (Left) TRANSESOPHAGEAL ECHOCARDIOGRAM (N/A) CV-CO/CI 5.9/2.8. SR. On Nicardipene for radial  artery conduit and Levophed drips. Will try to wean off Levophed. Transition to low dose Amlodipine and stop Nicardipene drip for radial artery conduit. Pulmonary-On 4 L via [place] . History of OSA. He did not tolerate bipap last night. Chest tubes with 290 cc since surgery. CXR this am appears to show low lung volumes and bibasilar atelectasis. Monitor CT output;if remains low may be able to remove later today vs am. Encourage incentive spirometer and flutter valve.  Volume overload-will likely diurese in am Expected post op blood loss anemia-H and H this am stable at 10.3 and 30.8 5. CBGs 119/130/133. On Insulin drip. Pre op HGA1C  5.7. He likely has pre diabetes. Will stop accu checks and SS PRN upon transfer. 6. Fever to 101.1 11 pm. WBC this am 13,400. Monitor and discuss with Dr. Cliffton Asters. 7. Remove a line once off pressors. Will remove foley in am. 8. Lovenox nightly for DVT prophylaxis  Donielle Margaretann Loveless PA-C 01/18/2023 7:03 AM  Agree with above Amlodipine today Weaning levo POD 1 progressing  Tomasina Keasling O Carolanne Mercier

## 2023-01-18 NOTE — Telephone Encounter (Signed)
FMLA form updated with date changes./ LOA beginning 01/17/23 through approx 04/16/23/ DOS 01/17/23./ copy mailed to pt's home address.

## 2023-01-19 ENCOUNTER — Inpatient Hospital Stay (HOSPITAL_COMMUNITY): Payer: No Typology Code available for payment source

## 2023-01-19 LAB — BASIC METABOLIC PANEL
Anion gap: 8 (ref 5–15)
BUN: 25 mg/dL — ABNORMAL HIGH (ref 6–20)
CO2: 24 mmol/L (ref 22–32)
Calcium: 8.1 mg/dL — ABNORMAL LOW (ref 8.9–10.3)
Chloride: 99 mmol/L (ref 98–111)
Creatinine, Ser: 1.08 mg/dL (ref 0.61–1.24)
GFR, Estimated: >60 mL/min (ref >60)
Glucose, Bld: 114 mg/dL — ABNORMAL HIGH (ref 70–99)
Potassium: 4.5 mmol/L (ref 3.5–5.1)
Sodium: 131 mmol/L — ABNORMAL LOW (ref 135–145)

## 2023-01-19 LAB — CBC
HCT: 27.2 % — ABNORMAL LOW (ref 39.0–52.0)
Hemoglobin: 9 g/dL — ABNORMAL LOW (ref 13.0–17.0)
MCH: 29.5 pg (ref 26.0–34.0)
MCHC: 33.1 g/dL (ref 30.0–36.0)
MCV: 89.2 fL (ref 80.0–100.0)
Platelets: 159 10*3/uL (ref 150–400)
RBC: 3.05 MIL/uL — ABNORMAL LOW (ref 4.22–5.81)
RDW: 12.9 % (ref 11.5–15.5)
WBC: 11.2 10*3/uL — ABNORMAL HIGH (ref 4.0–10.5)
nRBC: 0 % (ref 0.0–0.2)

## 2023-01-19 LAB — GLUCOSE, CAPILLARY
Glucose-Capillary: 112 mg/dL — ABNORMAL HIGH (ref 70–99)
Glucose-Capillary: 113 mg/dL — ABNORMAL HIGH (ref 70–99)
Glucose-Capillary: 95 mg/dL (ref 70–99)
Glucose-Capillary: 75 mg/dL (ref 70–99)
Glucose-Capillary: 93 mg/dL (ref 70–99)

## 2023-01-19 MED ORDER — ~~LOC~~ CARDIAC SURGERY, PATIENT & FAMILY EDUCATION: Freq: Once | Status: AC

## 2023-01-19 MED ORDER — SODIUM CHLORIDE 0.9% FLUSH: 3.0000 mL | INTRAVENOUS | Status: DC | PRN

## 2023-01-19 MED ORDER — POLYETHYLENE GLYCOL 3350 17 G PO PACK
17.0000 g | PACK | Freq: Every day | ORAL | Status: DC
  Administered 2023-01-19 – 2023-01-22 (×3): 17 g via ORAL
  Filled 2023-01-19 (×4): qty 1

## 2023-01-19 MED ORDER — BISACODYL 10 MG RE SUPP: 10.0000 mg | Freq: Every day | RECTAL | Status: DC | PRN

## 2023-01-19 MED ORDER — DOCUSATE SODIUM 100 MG PO CAPS
200.0000 mg | ORAL_CAPSULE | Freq: Every day | ORAL | Status: DC
  Administered 2023-01-20 – 2023-01-23 (×4): 200 mg via ORAL
  Filled 2023-01-19 (×4): qty 2

## 2023-01-19 MED ORDER — INSULIN ASPART 100 UNIT/ML IJ SOLN: 0.0000 [IU] | Freq: Three times a day (TID) | INTRAMUSCULAR | Status: DC

## 2023-01-19 MED ORDER — POTASSIUM CHLORIDE CRYS ER 20 MEQ PO TBCR
20.0000 meq | EXTENDED_RELEASE_TABLET | Freq: Every day | ORAL | Status: DC
  Administered 2023-01-19 – 2023-01-23 (×5): 20 meq via ORAL
  Filled 2023-01-19 (×5): qty 1

## 2023-01-19 MED ORDER — SODIUM CHLORIDE 0.9 % IV SOLN: 250.0000 mL | INTRAVENOUS | Status: DC | PRN

## 2023-01-19 MED ORDER — SODIUM CHLORIDE 0.9% FLUSH
3.0000 mL | Freq: Two times a day (BID) | INTRAVENOUS | Status: DC
  Administered 2023-01-19 – 2023-01-21 (×4): 3 mL via INTRAVENOUS

## 2023-01-19 MED ORDER — BISACODYL 5 MG PO TBEC: 10.0000 mg | DELAYED_RELEASE_TABLET | Freq: Every day | ORAL | Status: DC | PRN

## 2023-01-19 MED ORDER — METOPROLOL TARTRATE 12.5 MG HALF TABLET
12.5000 mg | ORAL_TABLET | Freq: Two times a day (BID) | ORAL | Status: DC
  Administered 2023-01-19 – 2023-01-20 (×3): 12.5 mg via ORAL
  Filled 2023-01-19 (×3): qty 1

## 2023-01-19 MED ORDER — MIDODRINE HCL 5 MG PO TABS
5.0000 mg | ORAL_TABLET | Freq: Three times a day (TID) | ORAL | Status: DC
  Administered 2023-01-20: 5 mg via ORAL
  Filled 2023-01-19: qty 1

## 2023-01-19 MED FILL — Lidocaine HCl Local Preservative Free (PF) Inj 2%: INTRAMUSCULAR | Qty: 14 | Status: AC

## 2023-01-19 MED FILL — Potassium Chloride Inj 2 mEq/ML: INTRAVENOUS | Qty: 40 | Status: AC

## 2023-01-19 MED FILL — Sodium Chloride IV Soln 0.9%: INTRAVENOUS | Qty: 2000 | Status: AC

## 2023-01-19 MED FILL — Mannitol IV Soln 20%: INTRAVENOUS | Qty: 500 | Status: AC

## 2023-01-19 MED FILL — Heparin Sodium (Porcine) Inj 1000 Unit/ML: Qty: 1000 | Status: AC

## 2023-01-19 MED FILL — Heparin Sodium (Porcine) Inj 1000 Unit/ML: INTRAMUSCULAR | Qty: 30 | Status: AC

## 2023-01-19 MED FILL — Calcium Chloride Inj 10%: INTRAVENOUS | Qty: 10 | Status: AC

## 2023-01-19 MED FILL — Electrolyte-R (PH 7.4) Solution: INTRAVENOUS | Qty: 3000 | Status: AC

## 2023-01-19 MED FILL — Sodium Bicarbonate IV Soln 8.4%: INTRAVENOUS | Qty: 50 | Status: AC

## 2023-01-19 NOTE — Progress Notes (Signed)
   NAME:  Shayla Donnel, MRN:  [id number], DOB:  [birth year], LOS: 2 ADMISSION DATE:  01/17/2023, CONSULTATION DATE:  01/17/23 REFERRING MD:  Cliffton Asters CVTS, CHIEF COMPLAINT:  s/p CABG x4   History of Present Illness:  57 yo M PMH mvCAD, OSA, HTN, s/p C-spine fusion who presented 8/21 for planned CABG x4, with L rad artery harvest.  Admitted to ICU post op as planned, on MV.   Total pump- 115 Xclamp- 63 EBL- 500  Product- 250 cellsaver   Pertinent  Medical History  mvCAD OSA on CPAP Glaucoma HTN   Significant Hospital Events: Including procedures, antibiotic start and stop dates in addition to other pertinent events   8/22 cabg x 4, radial harvest, extubation  Interim History / Subjective:  No events. Lots of pain trying BIPAP. Breathing is stable  Objective   Blood pressure 109/69, pulse 84, temperature 98 F (36.7 C), temperature source Oral, resp. rate 14, height 5\' 5"  (1.651 m), weight 107.3 kg, SpO2 96%. CVP:  [6 mmHg-16 mmHg] 16 mmHg      Intake/Output Summary (Last 24 hours) at 01/19/2023 0825 Last data filed at 01/19/2023 0600 Gross per 24 hour  Intake 840.44 ml  Output 1140 ml  Net -299.56 ml   Filed Weights   01/17/23 0644 01/18/23 0500 01/19/23 0500  Weight: 101.7 kg 106.3 kg 107.3 kg    Examination: No distress Tongue swelling better Sternotomy site dressed without strikethrough Moves to command RASS 0 L radial harvest site well approximated Lungs clear  CXR better than yesterday, more clear CBC stable BMP okay, negligible Cr increase  Resolved Hospital Problem list   Postop vent management Difficult airway  Assessment & Plan:  CABG x 4 w/ L radial harvest Postop vasoplegia- mix of this plus CCB gtt for vasospasm ppx; question some hypovolemia as well with dropping CVP Postop pain control OSA on CPAP  - Cleviprex to amlodipine - Wean levo for MAP 65, maybe can tolerate a little more volume but will defer to lightfoot - Drain management  per TCTS - Try CPAP at bedtime if tolerates - Multimodal pain control, encourage IS - So far doing well on usual postop pathway  31 min cc time  01/19/2023, 8:25 AM

## 2023-01-19 NOTE — Plan of Care (Signed)

## 2023-01-19 NOTE — TOC Initial Note (Signed)
Transition of Care [facility]) - Initial/Assessment Note    Patient Details  Name: Christopher [name] MRN: [id number] Date of Birth: Jul 16, 1965  Transition of Care [facility]) CM/SW Contact:    Elliot Cousin, RN Phone Number: [phone] 01/19/2023, 5:33 PM  Clinical Narrative:   Spoke to pt at bedside. States wife in home to assist with care. Discuss with pt [facility] arrange preoperatively for [facility].  Will continue to follow for dc needs.               Expected Discharge Plan: Home w Home Health Services Barriers to Discharge: Continued Medical Work up   Patient Goals and CMS Choice Patient states their goals for this hospitalization and ongoing recovery are:: wants to recover          Expected Discharge Plan and Services   Discharge Planning Services: CM Consult   Living arrangements for the past 2 months: Single Family Home                           HH Arranged: RN HH Agency: [facility]        Prior Living Arrangements/Services Living arrangements for the past 2 months: Single Family Home Lives with:: Spouse Patient language and need for interpreter reviewed:: Yes Do you feel safe going back to the place where you live?: Yes      Need for Family Participation in Patient Care: No (Comment) Care giver support system in place?: Yes (comment)   Criminal Activity/Legal Involvement Pertinent to Current Situation/Hospitalization: No - Comment as needed  Activities of Daily Living      Permission Sought/Granted Permission sought to share information with : Case Manager, Family Supports, PCP Permission granted to share information with : Yes, Verbal Permission Granted  Share Information with NAME: Martie Lee  Permission granted to share info w AGENCY: Home Health  Permission granted to share info w Relationship: wife  Permission granted to share info w Contact Information: [phone]  Emotional Assessment Appearance:: Appears stated  age Attitude/Demeanor/Rapport: Engaged Affect (typically observed): Accepting Orientation: : Oriented to Self, Oriented to Place, Oriented to  Time, Oriented to Situation   Psych Involvement: No (comment)  Admission diagnosis:  Coronary artery disease [I25.10] Patient Active Problem List   Diagnosis Date Noted   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   Coronary artery disease 01/17/2023   CAD (coronary artery disease) 12/15/2022   Encounter for [employer] (FAA) examination 07/22/2018   IBS (irritable bowel syndrome) 10/18/2016   OSA on CPAP 08/31/2015   HTN (hypertension) 08/19/2015   History of MRSA infection 08/19/2015   Psoriasis 08/19/2015   Elevated cholesterol 08/19/2015   Obesity, Class II, BMI 35-39.9 08/19/2015   Chronic back pain 03/25/2015   PCP:  Greta Doom, MD Pharmacy:   [id number] - [place], [place] - [address] [place] [address] Phone: [phone] Fax: [phone]  [facility] - [place], [place] - [address] [facility] AT Portal to Registered Caremark Sites [address] [place] [address] Phone: [phone] Fax: [phone]  [id number] [place], [place] - [address] [place] [address] Phone: [phone] Fax: [phone]     Social Determinants of Health (SDOH) Social History: SDOH Screenings   Food Insecurity: No Food Insecurity (01/19/2023)  Housing: Low Risk  (01/19/2023)  Transportation Needs: No Transportation Needs (01/19/2023)  Utilities: Not At Risk (01/19/2023)  Depression (PHQ2-9): Low Risk  (03/25/2019)  Social Connections: Unknown (10/09/2021)   Received from [facility]  Tobacco Use: Low Risk  (01/17/2023)   SDOH Interventions: Food Insecurity Interventions: Intervention Not Indicated Housing Interventions: Intervention Not Indicated Transportation Interventions: Intervention Not Indicated Utilities  Interventions: Intervention Not Indicated   Readmission Risk Interventions     No data to display

## 2023-01-19 NOTE — Progress Notes (Addendum)
T[facility] DAILY ICU PROGRESS NOTE                   [address]            [place] [address]          [phone]   2 Days Post-Op Procedure(s) (LRB): CORONARY ARTERY BYPASS GRAFTING (CABG) x 4 USING LEFT INTERNAL MAMMARY ARTERY AND ENDOSCOPICALLY HARVESTED RIGHT GREATER SAPHENOUS VEIN (N/A) RADIAL ARTERY HARVEST (Left) TRANSESOPHAGEAL ECHOCARDIOGRAM (N/A)  Total Length of Stay:  LOS: 2 days   Subjective: Up in the bedside chair. Had a better day yesterday. Pain control improved.  Norepi off.   Objective: Vital signs in last 24 hours: Temp:  [98 F (36.7 C)-99.3 F (37.4 C)] 98 F (36.7 C) (08/23 0736) Pulse Rate:  [70-105] 84 (08/23 0630) Cardiac Rhythm: Normal sinus rhythm (08/22 2000) Resp:  [12-27] 14 (08/23 0630) BP: (89-132)/(58-80) 109/69 (08/23 0630) SpO2:  [92 %-98 %] 96 % (08/23 0630) Arterial Line BP: (85-110)/(50-68) 102/68 (08/22 1130) Weight:  [107.3 kg] 107.3 kg (08/23 0500)  Filed Weights   01/17/23 0644 01/18/23 0500 01/19/23 0500  Weight: 101.7 kg 106.3 kg 107.3 kg    Weight change: 1 kg   Hemodynamic parameters for last 24 hours: CVP:  [6 mmHg-16 mmHg] 16 mmHg  Intake/Output from previous day: 08/22 0701 - 08/23 0700 In: 981.6 [I.V.:271.6; IV Piggyback:710] Out: 1210 [Urine:1090; Chest Tube:120]  Intake/Output this shift: No intake/output data recorded.  Current Meds: Scheduled Meds:  acetaminophen  1,000 mg Oral Q6H   Or   acetaminophen (TYLENOL) oral liquid 160 mg/5 mL  1,000 mg Per Tube Q6H   amLODipine  2.5 mg Oral Daily   aspirin EC  325 mg Oral Daily   Or   aspirin  324 mg Per Tube Daily   bisacodyl  10 mg Oral Daily   Or   bisacodyl  10 mg Rectal Daily   Chlorhexidine Gluconate Cloth  6 each Topical Daily   docusate sodium  200 mg Oral Daily   enoxaparin (LOVENOX) injection  40 mg Subcutaneous QHS   insulin aspart  0-24 Units Subcutaneous Q4H   ketorolac  30 mg Intravenous Q6H   latanoprost  1 drop Both Eyes  QHS   metoprolol tartrate  12.5 mg Oral BID   Or   metoprolol tartrate  12.5 mg Per Tube BID   pantoprazole  40 mg Oral Daily   rosuvastatin  40 mg Oral Daily   sodium chloride flush  3 mL Intravenous Q12H   Continuous Infusions:  sodium chloride Stopped (01/18/23 1049)   sodium chloride     sodium chloride     lactated ringers     lactated ringers Stopped (01/18/23 1052)   methocarbamol (ROBAXIN) IV 500 mg (01/19/23 0557)   norepinephrine (LEVOPHED) Adult infusion Stopped (01/18/23 0950)   PRN Meds:.sodium chloride, HYDROmorphone (DILAUDID) injection, metoprolol tartrate, midazolam, ondansetron (ZOFRAN) IV, oxyCODONE, polyvinyl alcohol, sodium chloride flush, traMADol  General appearance: alert, cooperative, and mild distress Neurologic: intact Heart: RRR, monitir showing NSR with no arryhthmias Lungs: breath sounds clear, minimal CT drainage.  Abdomen: soft, NT Extremities: all well perfused, normal motor and sensory function in left hand.  Wound: the sternotomy incision is covered with a dry Aquacel dressing. The left arm and EVH incisions are intact and dry  Lab Results: CBC: Recent Labs    01/18/23 1544 01/19/23 0601  WBC 12.0* 11.2*  HGB 9.3* 9.0*  HCT 28.8* 27.2*  PLT 180 159   BMET:  Recent Labs    01/18/23 1544 01/19/23 0601  NA 133* 131*  K 4.7 4.5  CL 103 99  CO2 24 24  GLUCOSE 123* 114*  BUN 19 25*  CREATININE 1.21 1.08  CALCIUM 7.9* 8.1*    CMET: Lab Results  Component Value Date   WBC 11.2 (H) 01/19/2023   HGB 9.0 (L) 01/19/2023   HCT 27.2 (L) 01/19/2023   PLT 159 01/19/2023   GLUCOSE 114 (H) 01/19/2023   CHOL 61 01/18/2023   TRIG 79 01/18/2023   HDL 19 (L) 01/18/2023   LDLCALC 26 01/18/2023   ALT 61 (H) 01/09/2023   AST 50 (H) 01/09/2023   NA 131 (L) 01/19/2023   K 4.5 01/19/2023   CL 99 01/19/2023   CREATININE 1.08 01/19/2023   BUN 25 (H) 01/19/2023   CO2 24 01/19/2023   INR 1.4 (H) 01/17/2023   HGBA1C 5.7 (H) 01/09/2023       PT/INR:  Recent Labs    01/17/23 1515  LABPROT 17.3*  INR 1.4*   Radiology: DG Chest Port 1 View  Result Date: 01/19/2023 CLINICAL DATA:  Pneumothorax EXAM: PORTABLE CHEST 1 VIEW COMPARISON:  Prior examinations are unavailable for comparison due to a PACS error. FINDINGS: Lung volumes are small, but are symmetric and are clear. No pneumothorax or pleural effusion. Right internal jugular central venous catheter tip overlies the expected innominate vein. Mediastinal drain and bilateral chest tubes are unchanged. Coronary artery bypass grafting has been performed. Cardiac size is at the upper limits of normal. Pulmonary vascularity is normal. IMPRESSION: 1. Support apparatus as described above. No pneumothorax. 2. Pulmonary hypoinflation. Electronically Signed   By: Helyn Numbers M.D.   On: 01/19/2023 05:49     Assessment/Plan: S/P Procedure(s) (LRB): CORONARY ARTERY BYPASS GRAFTING (CABG) x 4 USING LEFT INTERNAL MAMMARY ARTERY AND ENDOSCOPICALLY HARVESTED RIGHT GREATER SAPHENOUS VEIN (N/A) RADIAL ARTERY HARVEST (Left) TRANSESOPHAGEAL ECHOCARDIOGRAM (N/A)  -POD2 CABG x 4 for CAD with exertional angina. BP 90's -low 100s off norepi. Stable cardiac rhythm. On ASA, low-dose metoprolol, and amlodipine for the radial graft. Add Statin. D/C chest tubes and Cordis. Transfer to floor today.   -PULM- h/o OSA, non-smoker. CXR with low volume but clear lung fields.  CT's to be removed today, work on [employer]. Diurese as BP allows.   -GI- no nauses, tolerting PO's  -Renal- normal function at baseline. Diurese as BP allows.   -HEME- mild expected ABL anemia, monitoring.   -ENDO- no h/o DM, pre-op A1C 5.7.   CBG's well controlled.   -Disposition- D/C chest tubes and cordis. Transfer to floor today.    Leary Roca, PA-C [phone] 01/19/2023 8:22 AM   Agree with above. Doing well. Will transfer to floor.  Ayaka Andes Keane Scrape

## 2023-01-19 NOTE — Progress Notes (Signed)
      [address]       [place] [address]             [phone]    POD # 2 CABG x 4  Resting comfortably  BP 127/87   Pulse 85   Temp 98.2 F (36.8 C) (Oral)   Resp 17   Ht 5\' 5"  (1.651 m)   Wt 107.3 kg   SpO2 96%   BMI 39.36 kg/m    Intake/Output Summary (Last 24 hours) at 01/19/2023 1638 Last data filed at 01/19/2023 1628 Gross per 24 hour  Intake 184.76 ml  Output 1095 ml  Net -910.24 ml   Awaiting bed on 4E  Terrina Docter C. Dorris Fetch, MD [facility] [phone]

## 2023-01-20 ENCOUNTER — Inpatient Hospital Stay (HOSPITAL_COMMUNITY): Payer: No Typology Code available for payment source

## 2023-01-20 LAB — BASIC METABOLIC PANEL
Anion gap: 8 (ref 5–15)
BUN: 25 mg/dL — ABNORMAL HIGH (ref 6–20)
CO2: 25 mmol/L (ref 22–32)
Calcium: 8.2 mg/dL — ABNORMAL LOW (ref 8.9–10.3)
Chloride: 99 mmol/L (ref 98–111)
Creatinine, Ser: 1.04 mg/dL (ref 0.61–1.24)
GFR, Estimated: >60 mL/min (ref >60)
Glucose, Bld: 105 mg/dL — ABNORMAL HIGH (ref 70–99)
Potassium: 4.4 mmol/L (ref 3.5–5.1)
Sodium: 132 mmol/L — ABNORMAL LOW (ref 135–145)

## 2023-01-20 LAB — CBC
HCT: 26.1 % — ABNORMAL LOW (ref 39.0–52.0)
Hemoglobin: 8.8 g/dL — ABNORMAL LOW (ref 13.0–17.0)
MCH: 29.7 pg (ref 26.0–34.0)
MCHC: 33.7 g/dL (ref 30.0–36.0)
MCV: 88.2 fL (ref 80.0–100.0)
Platelets: 166 10*3/uL (ref 150–400)
RBC: 2.96 MIL/uL — ABNORMAL LOW (ref 4.22–5.81)
RDW: 12.6 % (ref 11.5–15.5)
WBC: 9.9 10*3/uL (ref 4.0–10.5)
nRBC: 0 % (ref 0.0–0.2)

## 2023-01-20 LAB — GLUCOSE, CAPILLARY
Glucose-Capillary: 132 mg/dL — ABNORMAL HIGH (ref 70–99)
Glucose-Capillary: 92 mg/dL (ref 70–99)
Glucose-Capillary: 114 mg/dL — ABNORMAL HIGH (ref 70–99)
Glucose-Capillary: 76 mg/dL (ref 70–99)
Glucose-Capillary: 107 mg/dL — ABNORMAL HIGH (ref 70–99)

## 2023-01-20 MED ORDER — FUROSEMIDE 40 MG PO TABS
40.0000 mg | ORAL_TABLET | Freq: Every day | ORAL | Status: DC
  Administered 2023-01-21 – 2023-01-23 (×3): 40 mg via ORAL
  Filled 2023-01-20 (×3): qty 1

## 2023-01-20 MED ORDER — MIDODRINE HCL 5 MG PO TABS
2.5000 mg | ORAL_TABLET | Freq: Three times a day (TID) | ORAL | Status: DC
  Administered 2023-01-20 – 2023-01-21 (×3): 2.5 mg via ORAL
  Filled 2023-01-20 (×3): qty 1

## 2023-01-20 NOTE — Progress Notes (Signed)
3 Days Post-Op Procedure(s) (LRB): CORONARY ARTERY BYPASS GRAFTING (CABG) x 4 USING LEFT INTERNAL MAMMARY ARTERY AND ENDOSCOPICALLY HARVESTED RIGHT GREATER SAPHENOUS VEIN (N/A) RADIAL ARTERY HARVEST (Left) TRANSESOPHAGEAL ECHOCARDIOGRAM (N/A) Subjective: Some incisional pain  Objective: Vital signs in last 24 hours: Temp:  [98 F (36.7 C)-99.2 F (37.3 C)] 98.6 F (37 C) (08/24 0800) Pulse Rate:  [78-118] 99 (08/24 0800) Cardiac Rhythm: Normal sinus rhythm (08/24 0800) Resp:  [15-25] 24 (08/24 0800) BP: (99-138)/(63-90) 109/72 (08/24 0800) SpO2:  [88 %-99 %] 92 % (08/24 0800) Weight:  [106.6 kg] 106.6 kg (08/24 0600)  Hemodynamic parameters for last 24 hours:    Intake/Output from previous day: 08/23 0701 - 08/24 0700 In: 138.7 [IV Piggyback:138.7] Out: 1025 [Urine:1025] Intake/Output this shift: Total I/O In: -  Out: 200 [Urine:200]  General appearance: alert, cooperative, and no distress Neurologic: intact Heart: regular rate and rhythm Lungs: diminished breath sounds bibasilar Abdomen: normal findings: soft, non-tender Edematous  Lab Results: Recent Labs    01/19/23 0601 01/20/23 0303  WBC 11.2* 9.9  HGB 9.0* 8.8*  HCT 27.2* 26.1*  PLT 159 166   BMET:  Recent Labs    01/19/23 0601 01/20/23 0303  NA 131* 132*  K 4.5 4.4  CL 99 99  CO2 24 25  GLUCOSE 114* 105*  BUN 25* 25*  CREATININE 1.08 1.04  CALCIUM 8.1* 8.2*    PT/INR:  Recent Labs    01/17/23 1515  LABPROT 17.3*  INR 1.4*   ABG    Component Value Date/Time   PHART 7.301 (L) 01/17/2023 2133   HCO3 22.4 01/17/2023 2133   TCO2 24 01/17/2023 2133   ACIDBASEDEF 4.0 (H) 01/17/2023 2133   O2SAT 96 01/17/2023 2133   CBG (last 3)  Recent Labs    01/19/23 2115 01/20/23 0637 01/20/23 0819  GLUCAP 93 132* 92    Assessment/Plan: S/P Procedure(s) (LRB): CORONARY ARTERY BYPASS GRAFTING (CABG) x 4 USING LEFT INTERNAL MAMMARY ARTERY AND ENDOSCOPICALLY HARVESTED RIGHT GREATER SAPHENOUS  VEIN (N/A) RADIAL ARTERY HARVEST (Left) TRANSESOPHAGEAL ECHOCARDIOGRAM (N/A) Plan for transfer to step-down: see transfer orders NEURO- intact CV- in SR, BP low 100-110 range  On midodrine- dc Norvasc until midodrine off  Continue Lopressor RESP- IS for atelectasis RENAL- creatinine and lytes OK  Fluid overload- diurese with PO Lasix ENDO- CBG well controlled GI- tolerating diet SCD + enoxaparin for DVT prophyalxis Continue cardiac rehab   LOS: 3 days    Loreli Slot 01/20/2023

## 2023-01-20 NOTE — Progress Notes (Signed)
Seen pt for ambulation however pt in BR, will defer at this time.   Christopher [name] 01/20/2023 12:16 PM

## 2023-01-20 NOTE — Progress Notes (Signed)
   01/20/23 1052  Vitals  Temp 98.4 F (36.9 C)  Temp Source Oral  BP 97/62  MAP (mmHg) 72  BP Location Right Arm  BP Method Automatic  Patient Position (if appropriate) Sitting  Pulse Rate 92  ECG Heart Rate 92  Resp 20  MEWS COLOR  MEWS Score Color Green  Oxygen Therapy  SpO2 98 %  O2 Device Room Air  MEWS Score  MEWS Temp 0  MEWS Systolic 1  MEWS Pulse 0  MEWS RR 0  MEWS LOC 0  MEWS Score 1   Patient arrived from [facility] to 4e15, patient placed on the monitor and CCMD made aware, patient placed on Mx40 -15 telemetry box. Patient in chair currently. Bedside RN aware of patient arrival Hasheem Voland, Randall An RN

## 2023-01-20 NOTE — Plan of Care (Signed)
Patient is using his incentspirometer, walking to the bathroom with no complication, and sitting up in the recliner. Reports no discomfort except when coughing or sneezing.

## 2023-01-21 LAB — GLUCOSE, CAPILLARY
Glucose-Capillary: 88 mg/dL (ref 70–99)
Glucose-Capillary: 95 mg/dL (ref 70–99)
Glucose-Capillary: 91 mg/dL (ref 70–99)
Glucose-Capillary: 120 mg/dL — ABNORMAL HIGH (ref 70–99)

## 2023-01-21 MED ORDER — METOPROLOL TARTRATE 25 MG PO TABS
25.0000 mg | ORAL_TABLET | Freq: Two times a day (BID) | ORAL | Status: DC
  Administered 2023-01-21 – 2023-01-23 (×5): 25 mg via ORAL
  Filled 2023-01-21 (×5): qty 1

## 2023-01-21 MED ORDER — ACETAMINOPHEN 500 MG PO TABS
1000.0000 mg | ORAL_TABLET | Freq: Four times a day (QID) | ORAL | Status: DC
  Administered 2023-01-21 – 2023-01-23 (×9): 1000 mg via ORAL
  Filled 2023-01-21 (×9): qty 2

## 2023-01-21 MED ORDER — SIMETHICONE 80 MG PO CHEW
80.0000 mg | CHEWABLE_TABLET | Freq: Four times a day (QID) | ORAL | Status: DC | PRN
  Administered 2023-01-21 – 2023-01-22 (×2): 80 mg via ORAL
  Filled 2023-01-21 (×2): qty 1

## 2023-01-21 NOTE — Plan of Care (Signed)
  Problem: Clinical Measurements: Goal: Respiratory complications will improve Outcome: Progressing   Problem: Activity: Goal: Risk for activity intolerance will decrease Outcome: Progressing   Problem: Coping: Goal: Level of anxiety will decrease Outcome: Progressing   Problem: Pain Managment: Goal: General experience of comfort will improve Outcome: Progressing   Problem: Education: Goal: Will demonstrate proper wound care and an understanding of methods to prevent future damage Outcome: Progressing   Problem: Activity: Goal: Risk for activity intolerance will decrease Outcome: Progressing   Problem: Cardiac: Goal: Will achieve and/or maintain hemodynamic stability Outcome: Progressing   Problem: Clinical Measurements: Goal: Postoperative complications will be avoided or minimized Outcome: Progressing   Problem: Respiratory: Goal: Respiratory status will improve Outcome: Progressing   Problem: Skin Integrity: Goal: Wound healing without signs and symptoms of infection Outcome: Progressing

## 2023-01-21 NOTE — Progress Notes (Addendum)
4 Days Post-Op Procedure(s) (LRB): CORONARY ARTERY BYPASS GRAFTING (CABG) x 4 USING LEFT INTERNAL MAMMARY ARTERY AND ENDOSCOPICALLY HARVESTED RIGHT GREATER SAPHENOUS VEIN (N/A) RADIAL ARTERY HARVEST (Left) TRANSESOPHAGEAL ECHOCARDIOGRAM (N/A) Subjective: Chest incisional discomfort and abdominal gas discomfort  Objective: Vital signs in last 24 hours: Temp:  [97.8 F (36.6 C)-98.6 F (37 C)] 98.6 F (37 C) (08/25 0741) Pulse Rate:  [92-107] 99 (08/25 0741) Cardiac Rhythm: Sinus tachycardia (08/24 1907) Resp:  [15-20] 19 (08/25 0741) BP: (97-117)/(62-77) 108/67 (08/25 0741) SpO2:  [96 %-100 %] 100 % (08/25 0741) Weight:  [105.1 kg] 105.1 kg (08/25 0502)  Hemodynamic parameters for last 24 hours:    Intake/Output from previous day: 08/24 0701 - 08/25 0700 In: 240 [P.O.:240] Out: 400 [Urine:400] Intake/Output this shift: No intake/output data recorded.  General appearance: alert, cooperative, and no distress Heart: regular rate and rhythm Lungs: coarse BS primarily in upper airways Abdomen: obese, non tender Extremities: + LE edema Wound: incis healing well, left hand n/v intact  Lab Results: Recent Labs    01/19/23 0601 01/20/23 0303  WBC 11.2* 9.9  HGB 9.0* 8.8*  HCT 27.2* 26.1*  PLT 159 166   BMET:  Recent Labs    01/19/23 0601 01/20/23 0303  NA 131* 132*  K 4.5 4.4  CL 99 99  CO2 24 25  GLUCOSE 114* 105*  BUN 25* 25*  CREATININE 1.08 1.04  CALCIUM 8.1* 8.2*    PT/INR: No results for input(s): "LABPROT", "INR" in the last 72 hours. ABG    Component Value Date/Time   PHART 7.301 (L) 01/17/2023 2133   HCO3 22.4 01/17/2023 2133   TCO2 24 01/17/2023 2133   ACIDBASEDEF 4.0 (H) 01/17/2023 2133   O2SAT 96 01/17/2023 2133   CBG (last 3)  Recent Labs    01/20/23 1617 01/20/23 2100 01/21/23 0552  GLUCAP 76 107* 88    Meds Scheduled Meds:  docusate sodium  200 mg Oral Daily   enoxaparin (LOVENOX) injection  40 mg Subcutaneous QHS   furosemide   40 mg Oral Daily   insulin aspart  0-24 Units Subcutaneous TID AC & HS   latanoprost  1 drop Both Eyes QHS   metoprolol tartrate  12.5 mg Oral BID   midodrine  2.5 mg Oral TID WC   pantoprazole  40 mg Oral Daily   polyethylene glycol  17 g Oral Daily   potassium chloride  20 mEq Oral Daily   rosuvastatin  40 mg Oral Daily   sodium chloride flush  3 mL Intravenous Q12H   Continuous Infusions:  sodium chloride     PRN Meds:.sodium chloride, bisacodyl **OR** bisacodyl, ondansetron (ZOFRAN) IV, oxyCODONE, sodium chloride flush, traMADol  Xrays DG Chest 2 View  Result Date: 01/20/2023 CLINICAL DATA:  [id number].  Mid field postoperative check. EXAM: CHEST - 2 VIEW COMPARISON:  Portable chest yesterday at 4:13 a.m. FINDINGS: 4:50 a.m.  Interval removal right IJ catheter.  No pneumothorax. Mediastinal drain and bilateral chest tubes have been removed. Intact sternotomy sutures are again noted with CABG changes. Stable mediastinum with superior widening. Mild cardiomegaly without vascular congestion. There is stable atelectasis in the bases. No focal pneumonia is seen. No substantial pleural effusion. There may be a minimal left pleural effusion. No new osseous findings. IMPRESSION: 1. Interval removal of the right IJ catheter, mediastinal drain and bilateral chest tubes. No pneumothorax. 2. Stable mild cardiomegaly without vascular congestion. 3. Stable bibasilar atelectasis. Electronically Signed   By: Earlean Shawl.D.  On: 01/20/2023 06:17    Assessment/Plan: S/P Procedure(s) (LRB): CORONARY ARTERY BYPASS GRAFTING (CABG) x 4 USING LEFT INTERNAL MAMMARY ARTERY AND ENDOSCOPICALLY HARVESTED RIGHT GREATER SAPHENOUS VEIN (N/A) RADIAL ARTERY HARVEST (Left) TRANSESOPHAGEAL ECHOCARDIOGRAM (N/A)  POD#4  1 afeb, VSS, sinus rhythm, sinus tachy to 120's at timess bp 90's-110's, no norvasc  while on midodrine, will increase beta blocker to lopressor 25 bid 2 O2 sats good on RA 3 voiding , unmeasured, wt  up approx 4 kg>preop, cont to diurese 4 BS well controlled- no DM per hx 5 no new labs or xrays 6 routine progression , CRI/pulm hygiene 7 recheck labs in am, poss home 1-2 days  LOS: 4 days    Rowe Clack PA-C Pager [phone] 01/21/2023  Patient seen and examined, agree with findings and plan outlined above Will dc midodrine and observe, if BP Ok resume Norvasc tomorrow  Viviann Spare C. Dorris Fetch, MD [facility] [phone]

## 2023-01-21 NOTE — Progress Notes (Signed)
Mobility Specialist Progress Note    01/21/23 1457  Mobility  Activity Ambulated with assistance in hallway  Level of Assistance Contact guard assist, steadying assist  Assistive Device Front wheel walker  Distance Ambulated (ft) 470 ft  Activity Response Tolerated well  Mobility Referral Yes  $Mobility charge 1 Mobility  Mobility Specialist Start Time (ACUTE ONLY) 1438  Mobility Specialist Stop Time (ACUTE ONLY) 1455  Mobility Specialist Time Calculation (min) (ACUTE ONLY) 17 min   Pt received in bed and agreeable. Took a few short standing rest breaks. Returned to bed with call bell in reach.   [place] Mobility Specialist  Please Neurosurgeon or Rehab Office at [phone]

## 2023-01-22 ENCOUNTER — Other Ambulatory Visit: Payer: Self-pay

## 2023-01-22 LAB — CBC
HCT: 28.6 % — ABNORMAL LOW (ref 39.0–52.0)
Hemoglobin: 9.5 g/dL — ABNORMAL LOW (ref 13.0–17.0)
MCH: 29.1 pg (ref 26.0–34.0)
MCHC: 33.2 g/dL (ref 30.0–36.0)
MCV: 87.5 fL (ref 80.0–100.0)
Platelets: 240 10*3/uL (ref 150–400)
RBC: 3.27 MIL/uL — ABNORMAL LOW (ref 4.22–5.81)
RDW: 13 % (ref 11.5–15.5)
WBC: 7.9 10*3/uL (ref 4.0–10.5)
nRBC: 0 % (ref 0.0–0.2)

## 2023-01-22 LAB — GLUCOSE, CAPILLARY
Glucose-Capillary: 98 mg/dL (ref 70–99)
Glucose-Capillary: 86 mg/dL (ref 70–99)
Glucose-Capillary: 82 mg/dL (ref 70–99)
Glucose-Capillary: 113 mg/dL — ABNORMAL HIGH (ref 70–99)

## 2023-01-22 LAB — BASIC METABOLIC PANEL
Anion gap: 10 (ref 5–15)
BUN: 17 mg/dL (ref 6–20)
CO2: 23 mmol/L (ref 22–32)
Calcium: 8.1 mg/dL — ABNORMAL LOW (ref 8.9–10.3)
Chloride: 99 mmol/L (ref 98–111)
Creatinine, Ser: 0.91 mg/dL (ref 0.61–1.24)
GFR, Estimated: >60 mL/min (ref >60)
Glucose, Bld: 115 mg/dL — ABNORMAL HIGH (ref 70–99)
Potassium: 3.9 mmol/L (ref 3.5–5.1)
Sodium: 132 mmol/L — ABNORMAL LOW (ref 135–145)

## 2023-01-22 MED ORDER — LACTULOSE 10 GM/15ML PO SOLN
20.0000 g | Freq: Once | ORAL | Status: AC
  Administered 2023-01-22: 20 g via ORAL
  Filled 2023-01-22: qty 30

## 2023-01-22 MED ORDER — ONDANSETRON 4 MG PO TBDP: 4.0000 mg | ORAL_TABLET | Freq: Three times a day (TID) | ORAL | Status: DC | PRN

## 2023-01-22 MED ORDER — AMLODIPINE BESYLATE 5 MG PO TABS
2.5000 mg | ORAL_TABLET | Freq: Every day | ORAL | Status: DC
  Administered 2023-01-22 – 2023-01-23 (×2): 2.5 mg via ORAL
  Filled 2023-01-22 (×2): qty 1

## 2023-01-22 NOTE — Plan of Care (Signed)

## 2023-01-22 NOTE — Plan of Care (Signed)
  Problem: Education: Goal: Knowledge of General Education information will improve Description: Including pain rating scale, medication(s)/side effects and non-pharmacologic comfort measures Outcome: Progressing   Problem: Health Behavior/Discharge Planning: Goal: Ability to manage health-related needs will improve Outcome: Progressing   Problem: Clinical Measurements: Goal: Cardiovascular complication will be avoided Outcome: Progressing   Problem: Activity: Goal: Risk for activity intolerance will decrease Outcome: Progressing   Problem: Coping: Goal: Level of anxiety will decrease Outcome: Progressing   Problem: Elimination: Goal: Will not experience complications related to bowel motility Outcome: Progressing   Problem: Pain Managment: Goal: General experience of comfort will improve Outcome: Progressing   Problem: Education: Goal: Will demonstrate proper wound care and an understanding of methods to prevent future damage Outcome: Progressing Goal: Knowledge of disease or condition will improve Outcome: Progressing Goal: Knowledge of the prescribed therapeutic regimen will improve Outcome: Progressing   Problem: Activity: Goal: Risk for activity intolerance will decrease Outcome: Progressing   Problem: Clinical Measurements: Goal: Postoperative complications will be avoided or minimized Outcome: Progressing   Problem: Skin Integrity: Goal: Wound healing without signs and symptoms of infection Outcome: Progressing Goal: Risk for impaired skin integrity will decrease Outcome: Progressing

## 2023-01-22 NOTE — Progress Notes (Addendum)
      [address]       [place] [address]             [phone]      5 Days Post-Op Procedure(s) (LRB): CORONARY ARTERY BYPASS GRAFTING (CABG) x 4 USING LEFT INTERNAL MAMMARY ARTERY AND ENDOSCOPICALLY HARVESTED RIGHT GREATER SAPHENOUS VEIN (N/A) RADIAL ARTERY HARVEST (Left) TRANSESOPHAGEAL ECHOCARDIOGRAM (N/A) Subjective: Awake and alert, said he did not rest well due to chest discomfort.  Walked in the hall yesterday.  On O2 at 2L/[place] in place of the C-PAP he usually uses at night.    Objective: Vital signs in last 24 hours: Temp:  [98 F (36.7 C)-99.4 F (37.4 C)] 98 F (36.7 C) (08/26 0330) Pulse Rate:  [78-99] 78 (08/25 2330) Cardiac Rhythm: Normal sinus rhythm (08/25 1900) Resp:  [13-20] 13 (08/26 0331) BP: (99-119)/(67-82) 116/82 (08/26 0331) SpO2:  [96 %-100 %] 100 % (08/26 0330) Weight:  [104.9 kg] 104.9 kg (08/26 0513)     Intake/Output from previous day: 08/25 0701 - 08/26 0700 In: 990 [P.O.:990] Out: -  Intake/Output this shift: No intake/output data recorded.  General appearance: alert, cooperative, and mild distress Neurologic: intact Heart: NSR, no significant arrhythmias Lungs: breath sounds shallow but clear.  Abdomen: soft, no tenderness Extremities: well perfused, incisions intact and dry Wound: the sternotomy incision is intact and dry  Lab Results: Recent Labs    01/20/23 0303  WBC 9.9  HGB 8.8*  HCT 26.1*  PLT 166   BMET:  Recent Labs    01/20/23 0303  NA 132*  K 4.4  CL 99  CO2 25  GLUCOSE 105*  BUN 25*  CREATININE 1.04  CALCIUM 8.2*    PT/INR: No results for input(s): "LABPROT", "INR" in the last 72 hours. ABG    Component Value Date/Time   PHART 7.301 (L) 01/17/2023 2133   HCO3 22.4 01/17/2023 2133   TCO2 24 01/17/2023 2133   ACIDBASEDEF 4.0 (H) 01/17/2023 2133   O2SAT 96 01/17/2023 2133   CBG (last 3)  Recent Labs    01/21/23 1623 01/21/23 2116 01/22/23 0607  GLUCAP 91 120* 98     Assessment/Plan: S/P Procedure(s) (LRB): CORONARY ARTERY BYPASS GRAFTING (CABG) x 4 USING LEFT INTERNAL MAMMARY ARTERY AND ENDOSCOPICALLY HARVESTED RIGHT GREATER SAPHENOUS VEIN (N/A) RADIAL ARTERY HARVEST (Left) TRANSESOPHAGEAL ECHOCARDIOGRAM (N/A)  -POD5. Stable cardiac rhythm and BP. Progressing with mobility. On asa, metoprolol, Crestor.    -PULM- h/o OSA but says he was not able to use the C-PAP here in the hospital. Used 2L [place] O2 last night, maintaining sats.   -Volume excess- Wt still ~3kg+. Continue daily Lasix.   -GI- Tolerating PO's. Says he had 1 loose stool since surgery but no "normal " BM yet.  Lactulose today.   -Disposition- Planning for discharge to home tomorrow, does not fees he is ready today.  Work on PT, and pulm hygiene and diuresis.    LOS: 5 days    Leary Roca, [place] [phone] 01/22/2023

## 2023-01-22 NOTE — Progress Notes (Signed)
Mobility Specialist Progress Note:   01/22/23 1100  Mobility  Activity Refused mobility  Mobility Specialist Start Time (ACUTE ONLY) 1110    Pt refused mobility d/t diarrhea. Will f/u as able.    [place] Earlene Plater Mobility Specialist Please contact via Special educational needs teacher or Rehab office at [phone]

## 2023-01-22 NOTE — H&P (Signed)
 [address]       [place] [address]             [phone]                                        [name] [facility] Medical Record #[id number] Date of Birth: Jul 05, 1965   Referring: Greta Doom, MD Primary Care: Greta Doom, MD Primary Cardiologist:None   Chief Complaint:        Chief Complaint  Patient presents with   Coronary Artery Disease      New patient consultation CATH 7/3, ECHO 5/9      History of Present Illness:     Christopher [name] 57 y.o. 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  He was evaluated for exertional anginal symptoms.  He denies any rest pain, or orthopnea     Past Medical and Surgical History: Previous Chest Surgery: no Previous Chest Radiation: no Diabetes Mellitus: no.  HbA1C pending Creatinine:  Recent Labs       Lab Results  Component Value Date    CREATININE 1.01 10/31/2017    CREATININE 0.96 09/10/2017    CREATININE 0.94 05/12/2016              Past Medical History:  Diagnosis Date   Allergy     Back pain     Glaucoma     Hypertension     MRSA carrier     Sleep apnea      On CPAP               Past Surgical History:  Procedure Laterality Date   carple tunnel in both hands        CERVICAL FUSION       COLONOSCOPY       TONSILLECTOMY AND ADENOIDECTOMY              Social History:   Tobacco Use History  Social History       Tobacco Use  Smoking Status Never  Smokeless Tobacco Never      Social History        Substance and Sexual Activity  Alcohol Use No   Alcohol/week: 0.0 standard drinks of alcohol        Allergies      Allergies  Allergen Reactions   Biaxin [Clarithromycin] Shortness Of Breath   Lidocaine Nausea Only                  Current Outpatient Medications  Medication Sig Dispense Refill   amLODipine (NORVASC) 5 MG tablet Take 5 mg by mouth daily.       aspirin 81 MG tablet Take 81 mg by mouth daily.        clotrimazole-betamethasone (LOTRISONE) cream Apply 1 application topically 2 (two) times daily. 30 g 0   cyclobenzaprine (FLEXERIL) 5 MG tablet Take 1 tablet (5 mg total) by mouth 3 (three) times daily. (Patient taking differently: Take 5 mg by mouth as needed.) 270 tablet 1   losartan (COZAAR) 100 MG tablet Take 100 mg by mouth daily.       nitroGLYCERIN (NITROSTAT) 0.3 MG SL tablet Place 1 tablet (0.3 mg total) under the tongue every 5 (five) minutes as needed for chest pain. 90 tablet 0   omega-3 acid ethyl esters (LOVAZA) 1 g capsule Take 1  capsule (1 g total) by mouth 2 (two) times daily. 180 capsule 3   travoprost, benzalkonium, (TRAVATAN) 0.004 % ophthalmic solution 1 drop at bedtime.          No current facility-administered medications for this visit.         (Not in a hospital admission)              Family History  Problem Relation Age of Onset   Hyperlipidemia Father     Cancer Maternal Grandfather     Diabetes Paternal Grandmother     Heart disease Paternal Grandmother     Colon cancer Neg Hx     Esophageal cancer Neg Hx     Rectal cancer Neg Hx     Stomach cancer Neg Hx              Review of Systems:    Review of Systems  Constitutional:  Positive for malaise/fatigue.  Respiratory:  Positive for shortness of breath.   Cardiovascular:  Positive for chest pain. Negative for palpitations and orthopnea.                   Physical Exam: BP 117/85 (BP Location: Left Arm, Patient Position: Sitting, Cuff Size: Normal)   Pulse 66   Resp 20   Ht 5\' 5"  (1.651 m)   Wt 225 lb 3.2 oz (102.2 kg)   SpO2 98% Comment: RA  BMI 37.48 kg/m  Physical Exam Constitutional:      General: He is not in acute distress.    Appearance: He is not ill-appearing.  HENT:     Head: Normocephalic and atraumatic.  Eyes:     Extraocular Movements: Extraocular movements intact.  Cardiovascular:     Rate and Rhythm: Normal rate.  Pulmonary:     Effort: Pulmonary effort is  normal. No respiratory distress.  Abdominal:     General: There is no distension.  Musculoskeletal:        General: Normal range of motion.     Cervical back: Normal range of motion.  Skin:    General: Skin is warm and dry.  Neurological:     General: No focal deficit present.     Mental Status: He is alert and oriented to person, place, and time.            I have independently reviewed the above radiologic studies and discussed with the patient    Recent Lab Findings: Recent Labs       Lab Results  Component Value Date    WBC 7.2 10/31/2017    HGB 16.0 10/31/2017    HCT 46.5 10/31/2017    PLT 319 10/31/2017    GLUCOSE 95 10/31/2017    CHOL 214 (H) 10/31/2017    TRIG 165 (H) 10/31/2017    HDL 38 (L) 10/31/2017    LDLCALC 143 (H) 10/31/2017    ALT 92 (H) 05/12/2016    AST 40 05/12/2016    NA 140 10/31/2017    K 4.3 10/31/2017    CL 103 10/31/2017    CREATININE 1.01 10/31/2017    BUN 12 10/31/2017    CO2 22 10/31/2017    HGBA1C 5.5 10/31/2017            Assessment / Plan:   57yo male with 3V CAD.  Echo shows no significant valvular disease.  On review of his LHC, his vessels are small, but he does have targets on the LAD, diagonal, PDA, and OM1.  We discussed the risks and benefits of CABG with a left radial artery harvest.  He is agreeable to proceed.

## 2023-01-22 NOTE — Progress Notes (Addendum)
CARDIAC REHAB PHASE I   PRE:  Rate/Rhythm: 96 SR    BP: sitting 125/76    SpO2: 99 RA  MODE:  Ambulation: 430 ft   POST:  Rate/Rhythm: 118 ST    BP: sitting to BR     SpO2: 97 RA  Pt up in recliner, many c/o's. Moved hips to EOB independently following sternal precautions and stood independently. Ambulated with RW, slow and steady. Main c/o was pain in leg incision therefore struggled to put right foot down completely. Overall tolerated well, to BR after walk. Encouraged IS, sitting up, eating, and x3 walks today. Working on pain control. He can ambulate independently. Will f/u for education. 1000 ml IS currently. [phone]   Ethelda Chick BS, ACSM-CEP 01/22/2023 9:11 AM

## 2023-01-23 ENCOUNTER — Other Ambulatory Visit (HOSPITAL_COMMUNITY): Payer: Self-pay

## 2023-01-23 LAB — GLUCOSE, CAPILLARY: Glucose-Capillary: 97 mg/dL (ref 70–99)

## 2023-01-23 MED ORDER — OXYCODONE HCL 5 MG PO TABS
5.0000 mg | ORAL_TABLET | ORAL | 0 refills | Status: DC | PRN
  Filled 2023-01-23: qty 28, 7d supply, fill #0

## 2023-01-23 MED ORDER — AMLODIPINE BESYLATE 2.5 MG PO TABS
2.5000 mg | ORAL_TABLET | Freq: Every day | ORAL | 1 refills | Status: AC
  Filled 2023-01-23 – 2023-02-16 (×4): qty 30, 30d supply, fill #0

## 2023-01-23 MED ORDER — ROSUVASTATIN CALCIUM 40 MG PO TABS
40.0000 mg | ORAL_TABLET | Freq: Every day | ORAL | 2 refills | Status: AC
  Filled 2023-01-23: qty 30, 30d supply, fill #0

## 2023-01-23 MED ORDER — ACETAMINOPHEN 500 MG PO TABS
1000.0000 mg | ORAL_TABLET | Freq: Four times a day (QID) | ORAL | 0 refills | Status: AC
Start: 2023-01-23 — End: ?
  Filled 2023-01-23: qty 100, 13d supply, fill #0

## 2023-01-23 MED ORDER — FUROSEMIDE 40 MG PO TABS
40.0000 mg | ORAL_TABLET | Freq: Every day | ORAL | 0 refills | Status: AC
  Filled 2023-01-23: qty 5, 5d supply, fill #0

## 2023-01-23 NOTE — Progress Notes (Signed)
CARDIAC REHAB PHASE I   Pt ready for discharge home today. Post OHS education including site care, restrictions, risk factors, exercise guidelines, IS use at home, sternal precautions, heart healthy diet, home needs at discharge and CRP2 reviewed. All questions and concerns addressed. Will refer to [facility] for CRP2. Plan for home today.    [id number] Woodroe Chen, RN BSN 01/23/2023 9:02 AM

## 2023-01-23 NOTE — Progress Notes (Signed)
      [address]       [place] [address]             [phone]      6 Days Post-Op Procedure(s) (LRB): CORONARY ARTERY BYPASS GRAFTING (CABG) x 4 USING LEFT INTERNAL MAMMARY ARTERY AND ENDOSCOPICALLY HARVESTED RIGHT GREATER SAPHENOUS VEIN (N/A) RADIAL ARTERY HARVEST (Left) TRANSESOPHAGEAL ECHOCARDIOGRAM (N/A) Subjective: Had a better day yesterday.   Walked in the hall early this morning.  BM yesterday.  Feels he is ready to go home.   Objective: Vital signs in last 24 hours: Temp:  [97.7 F (36.5 C)-98.7 F (37.1 C)] 98.7 F (37.1 C) (08/27 0421) Pulse Rate:  [76-94] 94 (08/27 0421) Cardiac Rhythm: Normal sinus rhythm (08/26 1900) Resp:  [15-20] 19 (08/27 0421) BP: (97-115)/(65-78) 109/72 (08/27 0421) SpO2:  [90 %-99 %] 98 % (08/27 0421)     Intake/Output from previous day: 08/26 0701 - 08/27 0700 In: 480 [P.O.:480] Out: -  Intake/Output this shift: No intake/output data recorded.  General appearance: alert, cooperative, and mild distress Neurologic: intact Heart: NSR, no arrhythmias Lungs: breath sounds full and clear.  Abdomen: soft, no tenderness Extremities: well perfused, incisions intact and dry Wound: the sternotomy incision is intact and dry  Lab Results: Recent Labs    01/22/23 0832  WBC 7.9  HGB 9.5*  HCT 28.6*  PLT 240   BMET:  Recent Labs    01/22/23 0832  NA 132*  K 3.9  CL 99  CO2 23  GLUCOSE 115*  BUN 17  CREATININE 0.91  CALCIUM 8.1*    PT/INR: No results for input(s): "LABPROT", "INR" in the last 72 hours. ABG    Component Value Date/Time   PHART 7.301 (L) 01/17/2023 2133   HCO3 22.4 01/17/2023 2133   TCO2 24 01/17/2023 2133   ACIDBASEDEF 4.0 (H) 01/17/2023 2133   O2SAT 96 01/17/2023 2133   CBG (last 3)  Recent Labs    01/22/23 1717 01/22/23 2122 01/23/23 0613  GLUCAP 82 113* 97    Assessment/Plan: S/P Procedure(s) (LRB): CORONARY ARTERY BYPASS GRAFTING (CABG) x 4 USING LEFT INTERNAL  MAMMARY ARTERY AND ENDOSCOPICALLY HARVESTED RIGHT GREATER SAPHENOUS VEIN (N/A) RADIAL ARTERY HARVEST (Left) TRANSESOPHAGEAL ECHOCARDIOGRAM (N/A)  -POD6. Stable cardiac rhythm and BP.Independent with mobility. On asa, metoprolol, amlodipine (for radial graft), Crestor.    -PULM- h/o OSA but says he was not able to use the C-PAP here in the hospital. Used 2L Victor O2 last few nights, maintaining sats.   -Volume excess- Wt still ~3kg+. Continue daily Lasix at home for 5 days.   -GI- Tolerating PO's. BM yesterday. Abd benign.  -Disposition- Planning for discharge to home today. Instructions given and follow up arranged.   LOS: 6 days    Leary Roca, [place] [phone] 01/23/2023

## 2023-01-23 NOTE — TOC Transition Note (Signed)
Transition of Care (TOC) - CM/SW Discharge Note Donn Pierini RN, BSN Transitions of Care Unit 4E- RN Case Manager See Treatment Team for direct phone #   Patient Details  Name: Christopher [name] MRN: [id number] Date of Birth: 1965-06-18  Transition of Care [facility]) CM/SW Contact:  Darrold Span, RN Phone Number: 01/23/2023, 11:48 AM   Clinical Narrative:    Pt stable for transition home today, wife to transport.  Per Cardiac Rehab- pt requesting RW for home.   CM spoke with pt at bedside- confirmed pt has PCP at the [facility]- pt will need to get RW through the [place]- CM will fax Urgent DME request- CM explained to [facility] that [facility] would [facility] within 3 business days then send RW to his home- pt voiced understanding.   Pt also has TCTS office referral for [facility] protocol w/ [facility]- pt informed and states understanding, [facility] liaison notified of discharge and will follow up for any HH needs and start of care.   Urgent DME request faxed to [facility] for RW need  No further TOC needs noted   Final next level of care: Home w Home Health Services Barriers to Discharge: Barriers Resolved   Patient Goals and CMS Choice    Return home, [facility]  Discharge Placement                 Home        Discharge Plan and Services Additional resources added to the After Visit Summary for     Discharge Planning Services: CM Consult Post Acute Care Choice: Durable Medical Equipment, Home Health          DME Arranged: Walker rolling DME Agency: [facility] [place] Date DME Agency Contacted: 01/23/23 Time DME Agency Contacted: 1000 Representative spoke with at DME Agency: Faxed urgent DME request HH Arranged: RN HH Agency: [facility] ([facility] Date HH Agency Contacted: 01/23/23 Time HH Agency Contacted: 1000 Representative spoke with at [facility] Agency: [place]  Social Determinants of Health (SDOH) Interventions SDOH Screenings   Food Insecurity: No  Food Insecurity (01/19/2023)  Housing: Low Risk  (01/19/2023)  Transportation Needs: No Transportation Needs (01/19/2023)  Utilities: Not At Risk (01/19/2023)  Depression (PHQ2-9): Low Risk  (03/25/2019)  Social Connections: Unknown (10/09/2021)   Received from [facility], [facility]  Tobacco Use: Low Risk  (01/17/2023)     Readmission Risk Interventions    01/23/2023   11:48 AM  Readmission Risk Prevention Plan  Medication Screening Complete  Transportation Screening Complete

## 2023-01-23 NOTE — Plan of Care (Signed)
Problem: Education: Goal: Knowledge of General Education information will improve Description: Including pain rating scale, medication(s)/side effects and non-pharmacologic comfort measures 01/23/2023 1040 by Coy Saunas, RN Outcome: Adequate for Discharge 01/23/2023 1040 by Coy Saunas, RN Outcome: Adequate for Discharge   Problem: Health Behavior/Discharge Planning: Goal: Ability to manage health-related needs will improve 01/23/2023 1040 by Coy Saunas, RN Outcome: Adequate for Discharge 01/23/2023 1040 by Coy Saunas, RN Outcome: Adequate for Discharge   Problem: Clinical Measurements: Goal: Ability to maintain clinical measurements within normal limits will improve 01/23/2023 1040 by Coy Saunas, RN Outcome: Adequate for Discharge 01/23/2023 1040 by Coy Saunas, RN Outcome: Adequate for Discharge Goal: Will remain free from infection 01/23/2023 1040 by Coy Saunas, RN Outcome: Adequate for Discharge 01/23/2023 1040 by Coy Saunas, RN Outcome: Adequate for Discharge Goal: Diagnostic test results will improve 01/23/2023 1040 by Coy Saunas, RN Outcome: Adequate for Discharge 01/23/2023 1040 by Coy Saunas, RN Outcome: Adequate for Discharge Goal: Respiratory complications will improve 01/23/2023 1040 by Coy Saunas, RN Outcome: Adequate for Discharge 01/23/2023 1040 by Coy Saunas, RN Outcome: Adequate for Discharge Goal: Cardiovascular complication will be avoided 01/23/2023 1040 by Coy Saunas, RN Outcome: Adequate for Discharge 01/23/2023 1040 by Coy Saunas, RN Outcome: Adequate for Discharge   Problem: Activity: Goal: Risk for activity intolerance will decrease 01/23/2023 1040 by Coy Saunas, RN Outcome: Adequate for Discharge 01/23/2023 1040 by Coy Saunas, RN Outcome: Adequate for Discharge   Problem: Nutrition: Goal: Adequate nutrition will be maintained 01/23/2023 1040 by Coy Saunas, RN Outcome: Adequate for  Discharge 01/23/2023 1040 by Coy Saunas, RN Outcome: Adequate for Discharge   Problem: Coping: Goal: Level of anxiety will decrease 01/23/2023 1040 by Coy Saunas, RN Outcome: Adequate for Discharge 01/23/2023 1040 by Coy Saunas, RN Outcome: Adequate for Discharge   Problem: Elimination: Goal: Will not experience complications related to bowel motility 01/23/2023 1040 by Coy Saunas, RN Outcome: Adequate for Discharge 01/23/2023 1040 by Coy Saunas, RN Outcome: Adequate for Discharge Goal: Will not experience complications related to urinary retention 01/23/2023 1040 by Coy Saunas, RN Outcome: Adequate for Discharge 01/23/2023 1040 by Coy Saunas, RN Outcome: Adequate for Discharge   Problem: Pain Managment: Goal: General experience of comfort will improve 01/23/2023 1040 by Coy Saunas, RN Outcome: Adequate for Discharge 01/23/2023 1040 by Coy Saunas, RN Outcome: Adequate for Discharge   Problem: Safety: Goal: Ability to remain free from injury will improve 01/23/2023 1040 by Coy Saunas, RN Outcome: Adequate for Discharge 01/23/2023 1040 by Coy Saunas, RN Outcome: Adequate for Discharge   Problem: Skin Integrity: Goal: Risk for impaired skin integrity will decrease 01/23/2023 1040 by Coy Saunas, RN Outcome: Adequate for Discharge 01/23/2023 1040 by Coy Saunas, RN Outcome: Adequate for Discharge   Problem: Education: Goal: Will demonstrate proper wound care and an understanding of methods to prevent future damage 01/23/2023 1040 by Coy Saunas, RN Outcome: Adequate for Discharge 01/23/2023 1040 by Coy Saunas, RN Outcome: Adequate for Discharge Goal: Knowledge of disease or condition will improve 01/23/2023 1040 by Coy Saunas, RN Outcome: Adequate for Discharge 01/23/2023 1040 by Coy Saunas, RN Outcome: Adequate for Discharge Goal: Knowledge of the prescribed therapeutic regimen will improve 01/23/2023 1040 by Coy Saunas, RN Outcome: Adequate for Discharge 01/23/2023 1040 by Coy Saunas, RN Outcome: Adequate for Discharge Goal:  Individualized Educational Video(s) 01/23/2023 1040 by Coy Saunas, RN Outcome: Adequate for Discharge 01/23/2023 1040 by Coy Saunas, RN Outcome: Adequate for Discharge   Problem: Activity: Goal: Risk for activity intolerance will decrease 01/23/2023 1040 by Coy Saunas, RN Outcome: Adequate for Discharge 01/23/2023 1040 by Coy Saunas, RN Outcome: Adequate for Discharge   Problem: Cardiac: Goal: Will achieve and/or maintain hemodynamic stability 01/23/2023 1040 by Coy Saunas, RN Outcome: Adequate for Discharge 01/23/2023 1040 by Coy Saunas, RN Outcome: Adequate for Discharge   Problem: Clinical Measurements: Goal: Postoperative complications will be avoided or minimized 01/23/2023 1040 by Coy Saunas, RN Outcome: Adequate for Discharge 01/23/2023 1040 by Coy Saunas, RN Outcome: Adequate for Discharge   Problem: Respiratory: Goal: Respiratory status will improve 01/23/2023 1040 by Coy Saunas, RN Outcome: Adequate for Discharge 01/23/2023 1040 by Coy Saunas, RN Outcome: Adequate for Discharge   Problem: Skin Integrity: Goal: Wound healing without signs and symptoms of infection 01/23/2023 1040 by Coy Saunas, RN Outcome: Adequate for Discharge 01/23/2023 1040 by Coy Saunas, RN Outcome: Adequate for Discharge Goal: Risk for impaired skin integrity will decrease 01/23/2023 1040 by Coy Saunas, RN Outcome: Adequate for Discharge 01/23/2023 1040 by Coy Saunas, RN Outcome: Adequate for Discharge   Problem: Urinary Elimination: Goal: Ability to achieve and maintain adequate renal perfusion and functioning will improve 01/23/2023 1040 by Coy Saunas, RN Outcome: Adequate for Discharge 01/23/2023 1040 by Coy Saunas, RN Outcome: Adequate for Discharge

## 2023-01-23 NOTE — Progress Notes (Signed)
Discharge instructions (including medications) discussed with and copy provided to patient/caregiver 

## 2023-01-26 ENCOUNTER — Ambulatory Visit (INDEPENDENT_AMBULATORY_CARE_PROVIDER_SITE_OTHER): Payer: Self-pay | Admitting: Thoracic Surgery (Cardiothoracic Vascular Surgery)

## 2023-01-26 ENCOUNTER — Other Ambulatory Visit (HOSPITAL_COMMUNITY): Payer: Self-pay

## 2023-01-26 DIAGNOSIS — Z951 Presence of aortocoronary bypass graft: Secondary | ICD-10-CM

## 2023-01-26 MED ORDER — OXYCODONE HCL 5 MG PO TABS
5.0000 mg | ORAL_TABLET | ORAL | 0 refills | Status: AC | PRN
  Filled 2023-01-26: qty 40, 7d supply, fill #0

## 2023-01-26 NOTE — Progress Notes (Signed)
     [address]       [place] [address]             [phone]       Patient: Home Provider: Office Consent for Telemedicine visit obtained.  Today's visit was completed via a real-time telehealth (see specific modality noted below). The patient/authorized person provided oral consent at the time of the visit to engage in a telemedicine encounter with the present provider at [facility]. The patient/authorized person was informed of the potential benefits, limitations, and risks of telemedicine. The patient/authorized person expressed understanding that the laws that protect confidentiality also apply to telemedicine. The patient/authorized person acknowledged understanding that telemedicine does not provide emergency services and that he or she would need to call 911 or proceed to the nearest hospital for help if such a need arose.   Total time spent in the clinical discussion 10 minutes.  Telehealth Modality: Phone visit (audio only)  I had a telephone visit with  Christopher [name] who is s/p CABG.  Overall doing well.  Pain is minimal.  Ambulating well. Vitals have been stable.  Christopher [name] will see [place] back in 1 month with a chest x-ray for cardiac rehab clearance.  [name] Keane Scrape

## 2023-01-30 LAB — ECHO INTRAOPERATIVE TEE
Height: 65 in
S' Lateral: 3.2 cm
Weight: 3588.81 oz

## 2023-02-13 ENCOUNTER — Other Ambulatory Visit (HOSPITAL_COMMUNITY): Payer: Self-pay

## 2023-02-16 ENCOUNTER — Other Ambulatory Visit (HOSPITAL_COMMUNITY): Payer: Self-pay

## 2023-02-19 ENCOUNTER — Other Ambulatory Visit: Payer: Self-pay | Admitting: Thoracic Surgery (Cardiothoracic Vascular Surgery)

## 2023-02-19 DIAGNOSIS — Z951 Presence of aortocoronary bypass graft: Secondary | ICD-10-CM

## 2023-02-19 NOTE — Progress Notes (Unsigned)
HPI:  Mr. . Gogerty  is a 57 year old male with past history of hypertension, dyslipidemia, obstructive sleep apnea, and irritable bowel syndrome.  He was referred to Dr. Cliffton Asters after left heart catheterization demonstrated severe three-vessel coronary artery disease.  Coronary bypass grafting was offered and he elected to proceed.  CABG x 4 was conducted on 01/17/2023 with LIMA LAD, sequential RSVG PLV and diagonal, radial artery to OM3 .  He had an uneventful postoperative course and was discharged home on postop day 6.  Low-dose amlodipine was continued to optimize radial graft patency.  Since hospital discharge the patient reports gradual progress in his strength and stamina.  He has some occasional chest soreness but has had no angina.  His shortness of breath has resolved.  He is back down to his preoperative weight and notes that his swelling has completely resolved.   Current Outpatient Medications  Medication Sig Dispense Refill   acetaminophen (TYLENOL) 500 MG tablet Take 2 tablets (1,000 mg total) by mouth 4 (four) times daily. 100 tablet 0   amLODipine (NORVASC) 2.5 MG tablet Take 1 tablet (2.5 mg total) by mouth daily. 30 tablet 1   chlorzoxazone (PARAFON) 500 MG tablet Take 500 mg by mouth 3 (three) times daily as needed for muscle spasms.     clobetasol ointment (TEMOVATE) 0.05 % Apply 1 Application topically 2 (two) times daily as needed (dermatitis flare).     furosemide (LASIX) 40 MG tablet Take 1 tablet (40 mg total) by mouth daily. 5 tablet 0   latanoprost (XALATAN) 0.005 % ophthalmic solution Place 1 drop into both eyes at bedtime.     metoprolol succinate (TOPROL-XL) 50 MG 24 hr tablet Take 25 mg by mouth daily.     nitroGLYCERIN (NITROSTAT) 0.3 MG SL tablet Place 1 tablet (0.3 mg total) under the tongue every 5 (five) minutes as needed for chest pain. 90 tablet 0   Polyvinyl Alcohol-Povidone (REFRESH OP) Place 1 drop into both eyes daily.     rosuvastatin (CRESTOR) 40  MG tablet Take 1 tablet (40 mg total) by mouth daily. 30 tablet 2   Simethicone (GAS-X PO) Take 2 tablets by mouth daily as needed (gas).     triamcinolone ointment (KENALOG) 0.1 % Apply 1 Application topically 2 (two) times daily as needed (dermatitis flare).     No current facility-administered medications for this visit.    Physical Exam Vital signs Blood pressure 135/78 Pulse 76 Respirations 20 SpO2 97% on room air  General: Mr. Aprahamian appears well and is in good spirits.  He is anxious to return to work as soon as released to do so. Heart: Regular rate and rhythm, no murmur Chest: Breath sounds are full, equal, and clear to auscultation.  The sternotomy incision is well-healed.  The sternum is stable. Extremities: There is no peripheral edema.  The left arm radial artery incision is well-healed.  Motor and sensory functions in the left hand are normal    Diagnostic Tests:   Impression / Plan: Mr. Delarm is making a progressive and satisfactory recovery following CABG x 4.  Recommend proceeding with cardiac rehab. A referral was made to the [facility] cardiac rehab service.  Medications were reviewed and no changes are indicated at this time from CT surgery standpoint.  I asked him to continue the Norvasc for another 4 weeks to optimize radial artery patency.  After that, he may discontinue it unless his cardiologist wants him to stay on Norvasc for blood pressure control.  Continue to observe sternal precautions for another 4 weeks.    He may resume driving.  He is given a note to return to work for [employer] duty (or telework) in 2 weeks.  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  Leary Roca, PA-C [facility] [phone]

## 2023-02-20 ENCOUNTER — Ambulatory Visit: Payer: Self-pay | Admitting: Physician Assistant

## 2023-02-20 ENCOUNTER — Ambulatory Visit
Admission: RE | Admit: 2023-02-20 | Discharge: 2023-02-20 | Disposition: A | Discharge status: Home / Self Care | Payer: No Typology Code available for payment source | Source: Ambulatory Visit | Attending: Thoracic Surgery (Cardiothoracic Vascular Surgery) | Admitting: Thoracic Surgery (Cardiothoracic Vascular Surgery)

## 2023-02-20 ENCOUNTER — Encounter: Payer: Self-pay | Admitting: *Deleted

## 2023-02-20 VITALS — BP 135/78 | HR 76 | Resp 20 | Ht 65.0 in | Wt 219.0 lb

## 2023-02-20 DIAGNOSIS — Z951 Presence of aortocoronary bypass graft: Secondary | ICD-10-CM

## 2023-02-20 NOTE — Patient Instructions (Signed)
You may resume driving  Recommend proceeding with cardiac rehab.  A referral was made to the [facility] cardiac rehab program.  Continue to observe sternal precautions with no lifting, pushing, or pulling greater than 15 pounds for another 8 weeks.  After that, you may gradually increase your activity without limitation  Follow-up as needed

## 2023-02-21 ENCOUNTER — Other Ambulatory Visit: Payer: Self-pay | Admitting: *Deleted

## 2023-02-26 ENCOUNTER — Telehealth (HOSPITAL_COMMUNITY): Payer: Self-pay

## 2023-02-26 NOTE — Telephone Encounter (Signed)
Pt called in reference to his cardiac rehab referral. Dr. Cliffton Asters has not signed off on it.    Called pt back and  Left him a message to called back. [place] still hasn't signed off just yet.

## 2023-02-27 ENCOUNTER — Other Ambulatory Visit: Payer: Self-pay | Admitting: *Deleted

## 2023-02-28 ENCOUNTER — Telehealth (HOSPITAL_COMMUNITY): Payer: Self-pay

## 2023-02-28 NOTE — Telephone Encounter (Signed)
Called patient to see if he was interested in participating in the Cardiac Rehab Program. Patient stated yes. Patient will come in for orientation on 03/01/23 @ 9:30AM and will attend the 1:45PM exercise class.  Pensions consultant.

## 2023-02-28 NOTE — Telephone Encounter (Signed)
Updated [facility] auth# [id number]

## 2023-03-01 ENCOUNTER — Encounter (HOSPITAL_COMMUNITY)
Admission: RE | Admit: 2023-03-01 | Discharge: 2023-03-01 | Disposition: A | Discharge status: Home / Self Care | Payer: No Typology Code available for payment source | Source: Ambulatory Visit | Attending: Cardiology | Admitting: Cardiology

## 2023-03-01 ENCOUNTER — Telehealth (HOSPITAL_COMMUNITY): Payer: Self-pay

## 2023-03-01 VITALS — Ht 65.0 in | Wt 221.8 lb

## 2023-03-01 DIAGNOSIS — Z48812 Encounter for surgical aftercare following surgery on the circulatory system: Secondary | ICD-10-CM | POA: Diagnosis not present

## 2023-03-01 DIAGNOSIS — Z951 Presence of aortocoronary bypass graft: Secondary | ICD-10-CM | POA: Diagnosis present

## 2023-03-01 NOTE — Progress Notes (Signed)
Cardiac Rehab Medication Review   Does the patient  feel that his/her medications are working for him/her?  yes  Has the patient been experiencing any side effects to the medications prescribed?  No  Does the patient measure his/her own blood pressure or blood glucose at home?  No  Does the patient have any problems obtaining medications due to transportation or finances?   No  Understanding of regimen: excellent Understanding of indications: excellent Potential of compliance: excellent   Comments: Patient feels good with his medication routine, he states he did run out of his statin today, but is working with the [place] to get a refill today. BP a little elevated at 140/72 resting. Will continue to follow patients BP as he starts the [employer] program next week.  Harrie Jeans 03/01/2023 2:41 PM

## 2023-03-01 NOTE — Telephone Encounter (Signed)
Per Christopher [name] pt would like to change his class time to 6:45 for CR.

## 2023-03-01 NOTE — Progress Notes (Signed)
Cardiac Individual Treatment Plan  Patient Details  Name: Christopher [name] MRN: [id number] Date of Birth: [birth year] Referring Provider:   Flowsheet Row INTENSIVE CARDIAC REHAB ORIENT from 03/01/2023 in [facility]  Referring Provider Dr. Carleene Cooper, MD (Dr. 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  Patient's Home Medications on Admission:  Current Outpatient Medications:    acetaminophen (TYLENOL) 500 MG tablet, Take 2 tablets (1,000 mg total) by mouth 4 (four) times daily., Disp: 100 tablet, Rfl: 0   amLODipine (NORVASC) 2.5 MG tablet, Take 1 tablet (2.5 mg total) by mouth daily., Disp: 30 tablet, Rfl: 1   cetirizine (ZYRTEC) 10 MG tablet, Take 10 mg by mouth daily., Disp: , Rfl:    chlorzoxazone (PARAFON) 500 MG tablet, Take 500 mg by mouth 3 (three) times daily as needed for muscle spasms., Disp: , Rfl:    clobetasol ointment (TEMOVATE) 0.05 %, Apply 1 Application topically 2 (two) times daily as needed (dermatitis flare)., Disp: , Rfl:    latanoprost (XALATAN) 0.005 % ophthalmic solution, Place 1 drop into both eyes at bedtime., Disp: , Rfl:    metoprolol succinate (TOPROL-XL) 50 MG 24 hr tablet, Take 25 mg by mouth daily., Disp: , Rfl:    nitroGLYCERIN (NITROSTAT) 0.3 MG SL tablet, Place 1 tablet (0.3 mg total) under the tongue every 5 (five) minutes as needed for chest pain., Disp: 90 tablet, Rfl: 0   Polyvinyl Alcohol-Povidone (REFRESH OP), Place 1 drop into both eyes daily., Disp: , Rfl:    rosuvastatin (CRESTOR) 40 MG tablet, Take 1 tablet (40 mg total) by mouth daily., Disp: 30 tablet, Rfl: 2   Simethicone (GAS-X PO), Take 2 tablets by mouth daily as needed (gas)., Disp: , Rfl:    triamcinolone ointment (KENALOG)  0.1 %, Apply 1 Application topically 2 (two) times daily as needed (dermatitis flare)., Disp: , Rfl:    furosemide (LASIX) 40 MG tablet, Take 1 tablet (40 mg total) by mouth daily. (Patient not taking: Reported on 03/01/2023), Disp: 5 tablet, Rfl: 0  Past Medical History: Past Medical History:  Diagnosis Date   Allergy    Anginal pain (HCC)    with exertion   Back pain    Coronary artery disease    Fatty liver    GERD (gastroesophageal reflux disease)    Glaucoma    Hypertension    MRSA carrier    Sleep apnea    On CPAP    Tobacco Use: Social History   Tobacco Use  Smoking Status Never  Smokeless Tobacco Never    Labs: Review Flowsheet  More data exists      Latest Ref Rng & Units 09/10/2017 10/31/2017 01/09/2023 01/17/2023 01/18/2023  Labs for ITP Cardiac and Pulmonary Rehab  Cholestrol 0 - 200 mg/dL 409  811  - - 61   LDL (calc) 0 - 99 mg/dL 914  782  - - 26   HDL-C >40 mg/dL 31  38  - - 19   Trlycerides <150 mg/dL 956  213  - - 79   Hemoglobin A1c 4.8 - 5.6 % - 5.5  5.7  - -  PH, Arterial 7.35 - 7.45 - - 7.42  7.301  7.234  7.281  7.275  7.323  7.314  7.317  -  PCO2 arterial 32 - 48 mmHg - - 36  45.9  28.6  42.5  39.6  46.0  38.4  44.4  -  Bicarbonate 20.0 - 28.0 mmol/L - - 23.4  22.4  11.9  20.0  18.4  23.9  19.5  22.7  -  TCO2 22 - 32 mmol/L - - - 24  13  21  20  23  25  24  21  21  22  22  24   -  Acid-base deficit 0.0 - 2.0 mmol/L - - 0.7  4.0  14.0  6.0  8.0  2.0  6.0  4.0  -  O2 Saturation % - - 100  96  97  94  96  99  100  99  -    Details       Multiple values from one day are sorted in reverse-chronological order         Capillary Blood Glucose: Lab Results  Component Value Date   GLUCAP 97 01/23/2023   GLUCAP 113 (H) 01/22/2023   GLUCAP 82 01/22/2023   GLUCAP 86 01/22/2023   GLUCAP 98 01/22/2023     Exercise Target Goals: Exercise Program Goal: Individual exercise prescription set using results from initial 6 min walk test and THRR while  considering  patient's activity barriers and safety.   Exercise Prescription Goal: Initial exercise prescription builds to 30-45 minutes a day of aerobic activity, 2-3 days per week.  Home exercise guidelines will be given to patient during program as part of exercise prescription that the participant will acknowledge.  Activity Barriers & Risk Stratification:  Activity Barriers & Cardiac Risk Stratification - 03/01/23 1401       Activity Barriers & Cardiac Risk Stratification   Activity Barriers Deconditioning;Back Problems;Neck/Spine Problems    Cardiac Risk Stratification High   Under 5 MET's on            6 Minute Walk:  6 Minute Walk     Row Name 03/01/23 1359         6 Minute Walk   Phase Initial     Distance 1265 feet     Walk Time 6 minutes     # of Rest Breaks 0     MPH 2.4     METS 3.16     RPE 7     Perceived Dyspnea  0     VO2 Peak 11.06     Symptoms Yes (comment)     Comments incision site pain 2/10, right side of scar, resolved with rest     Resting HR 75 bpm     Resting BP 140/72     Resting Oxygen Saturation  99 %     Exercise Oxygen Saturation  during 6 min walk 100 %     Max Ex. HR 92 bpm     Max Ex. BP 150/84     2 Minute Post BP 148/82              Oxygen Initial Assessment:   Oxygen Re-Evaluation:   Oxygen Discharge (Final Oxygen Re-Evaluation):   Initial Exercise Prescription:  Initial Exercise Prescription - 03/01/23 1400       Date of Initial Exercise RX and Referring Provider   Date 03/01/23    Referring Provider Dr. Carleene Cooper, MD (Dr. Armanda Magic covering)    Expected Discharge Date 05/21/23      Recumbant Bike  Level 2    RPM 60    Watts 15    Minutes 15    METs 1.9      NuStep   Level 2    SPM 80    Minutes 15    METs 2      Prescription Details   Frequency (times per week) 3    Duration Progress to 30 minutes of continuous aerobic without signs/symptoms of physical distress      Intensity    THRR 40-80% of Max Heartrate 66-132    Ratings of Perceived Exertion 11-13    Perceived Dyspnea 0-4      Progression   Progression Continue progressive overload as per policy without signs/symptoms or physical distress.      Resistance Training   Training Prescription Yes    Weight 3    Reps 10-15             Perform Capillary Blood Glucose checks as needed.  Exercise Prescription Changes:   Exercise Comments:   Exercise Goals and Review:   Exercise Goals     Row Name 03/01/23 1409             Exercise Goals   Increase Physical Activity Yes       Intervention Provide advice, education, support and counseling about physical activity/exercise needs.;Develop an individualized exercise prescription for aerobic and resistive training based on initial evaluation findings, risk stratification, comorbidities and participant's personal goals.       Expected Outcomes Short Term: Attend rehab on a regular basis to increase amount of physical activity.;Long Term: Exercising regularly at least 3-5 days a week.;Long Term: Add in home exercise to make exercise part of routine and to increase amount of physical activity.       Increase Strength and Stamina Yes       Intervention Provide advice, education, support and counseling about physical activity/exercise needs.;Develop an individualized exercise prescription for aerobic and resistive training based on initial evaluation findings, risk stratification, comorbidities and participant's personal goals.       Expected Outcomes Short Term: Increase workloads from initial exercise prescription for resistance, speed, and METs.;Short Term: Perform resistance training exercises routinely during rehab and add in resistance training at home;Long Term: Improve cardiorespiratory fitness, muscular endurance and strength as measured by increased METs and functional capacity ( )       Able to understand and use rate of perceived exertion (RPE)  scale Yes       Intervention Provide education and explanation on how to use RPE scale       Expected Outcomes Short Term: Able to use RPE daily in rehab to express subjective intensity level;Long Term:  Able to use RPE to guide intensity level when exercising independently       Knowledge and understanding of Target Heart Rate Range (THRR) Yes       Intervention Provide education and explanation of THRR including how the numbers were predicted and where they are located for reference       Expected Outcomes Short Term: Able to state/look up THRR;Long Term: Able to use THRR to govern intensity when exercising independently;Short Term: Able to use daily as guideline for intensity in rehab       Understanding of Exercise Prescription Yes       Intervention Provide education, explanation, and written materials on patient's individual exercise prescription       Expected Outcomes Short Term: Able to explain program exercise prescription;Long Term: Able to  explain home exercise prescription to exercise independently                Exercise Goals Re-Evaluation :   Discharge Exercise Prescription (Final Exercise Prescription Changes):   Nutrition:  Target Goals: Understanding of nutrition guidelines, daily intake of sodium 1500mg , cholesterol 200mg , calories 30% from fat and 7% or less from saturated fats, daily to have 5 or more servings of fruits and vegetables.  Biometrics:  Pre Biometrics - 03/01/23 1410       Pre Biometrics   Height 5\' 5"  (1.651 m)    Weight 100.6 kg    Waist Circumference 46.75 inches    Hip Circumference 41 inches    Waist to Hip Ratio 1.14 %    BMI (Calculated) 36.91    Triceps Skinfold 12 mm    % Body Fat 33.3 %    Grip Strength 42 kg    Flexibility 5 in    Single Leg Stand 30 seconds              Nutrition Therapy Plan and Nutrition Goals:   Nutrition Assessments:  MEDIFICTS Score Key: >=70 Need to make dietary changes  40-70 Heart Healthy  Diet <= 40 Therapeutic Level Cholesterol Diet    Picture Your Plate Scores: <16 Unhealthy dietary pattern with much room for improvement. 41-50 Dietary pattern unlikely to meet recommendations for good health and room for improvement. 51-60 More healthful dietary pattern, with some room for improvement.  >60 Healthy dietary pattern, although there may be some specific behaviors that could be improved.    Nutrition Goals Re-Evaluation:   Nutrition Goals Re-Evaluation:   Nutrition Goals Discharge (Final Nutrition Goals Re-Evaluation):   Psychosocial: Target Goals: Acknowledge presence or absence of significant depression and/or stress, maximize coping skills, provide positive support system. Participant is able to verbalize types and ability to use techniques and skills needed for reducing stress and depression.  Initial Review & Psychosocial Screening:  Initial Psych Review & Screening - 03/01/23 1412       Initial Review   Current issues with Current Stress Concerns   some mild stress with his health and his wifes health concerns. They are also planning a move. Does not feel the need for any additional resources at this time.   Source of Stress Concerns Family      Family Dynamics   Good Support System? Yes   Wife is good support     Barriers   Psychosocial barriers to participate in program The patient should benefit from training in stress management and relaxation.      Screening Interventions   Interventions Encouraged to exercise;Provide feedback about the scores to participant    Expected Outcomes Long Term Goal: Stressors or current issues are controlled or eliminated.;Short Term goal: Identification and review with participant of any Quality of Life or Depression concerns found by scoring the questionnaire.;Long Term goal: The participant improves quality of Life and PHQ9 Scores as seen by post scores and/or verbalization of changes             Quality of Life  Scores:  Quality of Life - 03/01/23 1415       Quality of Life   Select Quality of Life      Quality of Life Scores   Health/Function Pre 13.93 %    Socioeconomic Pre 26.67 %    Psych/Spiritual Pre 20 %    Family Pre 20.4 %    GLOBAL Pre 18.52 %  Scores of 19 and below usually indicate a poorer quality of life in these areas.  A difference of  2-3 points is a clinically meaningful difference.  A difference of 2-3 points in the total score of the Quality of Life Index has been associated with significant improvement in overall quality of life, self-image, physical symptoms, and general health in studies assessing change in quality of life.  PHQ-9: Review Flowsheet  More data exists      03/01/2023 05/06/2018 03/14/2018 10/31/2017 09/10/2017  Depression screen PHQ 2/9  Decreased Interest 0 0 0 0 0  Down, Depressed, Hopeless 0 0 0 0 0  PHQ - 2 Score 0 0 0 0 0  Altered sleeping 0 - - - -  Tired, decreased energy 0 - - - -  Change in appetite 0 - - - -  Feeling bad or failure about yourself  0 - - - -  Trouble concentrating 0 - - - -  Moving slowly or fidgety/restless 0 - - - -  Suicidal thoughts 0 - - - -  PHQ-9 Score 0 - - - -    Details           Interpretation of Total Score  Total Score Depression Severity:  1-4 = Minimal depression, 5-9 = Mild depression, 10-14 = Moderate depression, 15-19 = Moderately severe depression, 20-27 = Severe depression   Psychosocial Evaluation and Intervention:   Psychosocial Re-Evaluation:   Psychosocial Discharge (Final Psychosocial Re-Evaluation):   Vocational Rehabilitation: Provide vocational rehab assistance to qualifying candidates.   Vocational Rehab Evaluation & Intervention:  Vocational Rehab - 03/01/23 1420       Initial Vocational Rehab Evaluation & Intervention   Assessment shows need for Vocational Rehabilitation No   No needs at this time            Education: Education Goals: Education  classes will be provided on a weekly basis, covering required topics. Participant will state understanding/return demonstration of topics presented.     Core Videos: Exercise    Move It!  Clinical staff conducted group or individual video education with verbal and written material and guidebook.  Patient learns the recommended Pritikin exercise program. Exercise with the goal of living a long, healthy life. Some of the health benefits of exercise include controlled diabetes, healthier blood pressure levels, improved cholesterol levels, improved heart and lung capacity, improved sleep, and better body composition. Everyone should speak with their doctor before starting or changing an exercise routine.  Biomechanical Limitations Clinical staff conducted group or individual video education with verbal and written material and guidebook.  Patient learns how biomechanical limitations can impact exercise and how we can mitigate and possibly overcome limitations to have an impactful and balanced exercise routine.  Body Composition Clinical staff conducted group or individual video education with verbal and written material and guidebook.  Patient learns that body composition (ratio of muscle mass to fat mass) is a key component to assessing overall fitness, rather than body weight alone. Increased fat mass, especially visceral belly fat, can put [place] at increased risk for metabolic syndrome, type 2 diabetes, heart disease, and even death. It is recommended to combine diet and exercise (cardiovascular and resistance training) to improve your body composition. Seek guidance from your physician and exercise physiologist before implementing an exercise routine.  Exercise Action Plan Clinical staff conducted group or individual video education with verbal and written material and guidebook.  Patient learns the recommended strategies to achieve and enjoy long-term exercise  adherence, including variety,  self-motivation, self-efficacy, and positive decision making. Benefits of exercise include fitness, good health, weight management, more energy, better sleep, less stress, and overall well-being.  Medical   Heart Disease Risk Reduction Clinical staff conducted group or individual video education with verbal and written material and guidebook.  Patient learns our heart is our most vital organ as it circulates oxygen, nutrients, white blood cells, and hormones throughout the entire body, and carries waste away. Data supports a plant-based eating plan like the Pritikin Program for its effectiveness in slowing progression of and reversing heart disease. The video provides a number of recommendations to address heart disease.   Metabolic Syndrome and Belly Fat  Clinical staff conducted group or individual video education with verbal and written material and guidebook.  Patient learns what metabolic syndrome is, how it leads to heart disease, and how one can reverse it and keep it from coming back. You have metabolic syndrome if you have 3 of the following 5 criteria: abdominal obesity, high blood pressure, high triglycerides, low HDL cholesterol, and high blood sugar.  Hypertension and Heart Disease Clinical staff conducted group or individual video education with verbal and written material and guidebook.  Patient learns that high blood pressure, or hypertension, is very common in the [place]. Hypertension is largely due to excessive salt intake, but other important risk factors include being overweight, physical inactivity, drinking too much alcohol, smoking, and not eating enough potassium from fruits and vegetables. High blood pressure is a leading risk factor for heart attack, stroke, congestive heart failure, dementia, kidney failure, and premature death. Long-term effects of excessive salt intake include stiffening of the arteries and thickening of heart muscle and organ damage. Recommendations  include ways to reduce hypertension and the risk of heart disease.  Diseases of Our Time - Focusing on Diabetes Clinical staff conducted group or individual video education with verbal and written material and guidebook.  Patient learns why the best way to stop diseases of our time is prevention, through food and other lifestyle changes. Medicine (such as prescription pills and surgeries) is often only a Band-Aid on the problem, not a long-term solution. Most common diseases of our time include obesity, type 2 diabetes, hypertension, heart disease, and cancer. The Pritikin Program is recommended and has been proven to help reduce, reverse, and/or prevent the damaging effects of metabolic syndrome.  Nutrition   Overview of the Pritikin Eating Plan  Clinical staff conducted group or individual video education with verbal and written material and guidebook.  Patient learns about the Pritikin Eating Plan for disease risk reduction. The Pritikin Eating Plan emphasizes a wide variety of unrefined, minimally-processed carbohydrates, like fruits, vegetables, whole grains, and legumes. Go, Caution, and Stop food choices are explained. Plant-based and lean animal proteins are emphasized. Rationale provided for low sodium intake for blood pressure control, low added sugars for blood sugar stabilization, and low added fats and oils for coronary artery disease risk reduction and weight management.  Calorie Density  Clinical staff conducted group or individual video education with verbal and written material and guidebook.  Patient learns about calorie density and how it impacts the Pritikin Eating Plan. Knowing the characteristics of the food you choose will help you decide whether those foods will lead to weight gain or weight loss, and whether you want to consume more or less of them. Weight loss is usually a side effect of the Pritikin Eating Plan because of its focus on low calorie-dense foods.  Label Reading   Clinical staff conducted group or individual video education with verbal and written material and guidebook.  Patient learns about the Pritikin recommended label reading guidelines and corresponding recommendations regarding calorie density, added sugars, sodium content, and whole grains.  Dining Out - Part 1  Clinical staff conducted group or individual video education with verbal and written material and guidebook.  Patient learns that restaurant meals can be sabotaging because they can be so high in calories, fat, sodium, and/or sugar. Patient learns recommended strategies on how to positively address this and avoid unhealthy pitfalls.  Facts on Fats  Clinical staff conducted group or individual video education with verbal and written material and guidebook.  Patient learns that lifestyle modifications can be just as effective, if not more so, as many medications for lowering your risk of heart disease. A Pritikin lifestyle can help to reduce your risk of inflammation and atherosclerosis (cholesterol build-up, or plaque, in the artery walls). Lifestyle interventions such as dietary choices and physical activity address the cause of atherosclerosis. A review of the types of fats and their impact on blood cholesterol levels, along with dietary recommendations to reduce fat intake is also included.  Nutrition Action Plan  Clinical staff conducted group or individual video education with verbal and written material and guidebook.  Patient learns how to incorporate Pritikin recommendations into their lifestyle. Recommendations include planning and keeping personal health goals in mind as an important part of their success.  Healthy Mind-Set    Healthy Minds, Bodies, Hearts  Clinical staff conducted group or individual video education with verbal and written material and guidebook.  Patient learns how to identify when they are stressed. Video will discuss the impact of that stress, as well as the  many benefits of stress management. Patient will also be introduced to stress management techniques. The way we think, act, and feel has an impact on our hearts.  How Our Thoughts Can Heal Our Hearts  Clinical staff conducted group or individual video education with verbal and written material and guidebook.  Patient learns that negative thoughts can cause depression and anxiety. This can result in negative lifestyle behavior and serious health problems. Cognitive behavioral therapy is an effective method to help control our thoughts in order to change and improve our emotional outlook.  Additional Videos:  Exercise    Improving Performance  Clinical staff conducted group or individual video education with verbal and written material and guidebook.  Patient learns to use a non-linear approach by alternating intensity levels and lengths of time spent exercising to help burn more calories and lose more body fat. Cardiovascular exercise helps improve heart health, metabolism, hormonal balance, blood sugar control, and recovery from fatigue. Resistance training improves strength, endurance, balance, coordination, reaction time, metabolism, and muscle mass. Flexibility exercise improves circulation, posture, and balance. Seek guidance from your physician and exercise physiologist before implementing an exercise routine and learn your capabilities and proper form for all exercise.  Introduction to Yoga  Clinical staff conducted group or individual video education with verbal and written material and guidebook.  Patient learns about yoga, a discipline of the coming together of mind, breath, and body. The benefits of yoga include improved flexibility, improved range of motion, better posture and core strength, increased lung function, weight loss, and positive self-image. Yoga's heart health benefits include lowered blood pressure, healthier heart rate, decreased cholesterol and triglyceride levels, improved  immune function, and reduced stress. Seek guidance from your physician and exercise physiologist before  implementing an exercise routine and learn your capabilities and proper form for all exercise.  Medical   Aging: Enhancing Your Quality of Life  Clinical staff conducted group or individual video education with verbal and written material and guidebook.  Patient learns key strategies and recommendations to stay in good physical health and enhance quality of life, such as prevention strategies, having an advocate, securing a Health Care Proxy and Power of Attorney, and keeping a list of medications and system for tracking them. It also discusses how to avoid risk for bone loss.  Biology of Weight Control  Clinical staff conducted group or individual video education with verbal and written material and guidebook.  Patient learns that weight gain occurs because we consume more calories than we burn (eating more, moving less). Even if your body weight is normal, you may have higher ratios of fat compared to muscle mass. Too much body fat puts you at increased risk for cardiovascular disease, heart attack, stroke, type 2 diabetes, and obesity-related cancers. In addition to exercise, following the Pritikin Eating Plan can help reduce your risk.  Decoding Lab Results  Clinical staff conducted group or individual video education with verbal and written material and guidebook.  Patient learns that lab test reflects one measurement whose values change over time and are influenced by many factors, including medication, stress, sleep, exercise, food, hydration, pre-existing medical conditions, and more. It is recommended to use the knowledge from this video to become more involved with your lab results and evaluate your numbers to speak with your doctor.   Diseases of Our Time - Overview  Clinical staff conducted group or individual video education with verbal and written material and guidebook.  Patient  learns that according to the CDC, 50% to 70% of chronic diseases (such as obesity, type 2 diabetes, elevated lipids, hypertension, and heart disease) are avoidable through lifestyle improvements including healthier food choices, listening to satiety cues, and increased physical activity.  Sleep Disorders Clinical staff conducted group or individual video education with verbal and written material and guidebook.  Patient learns how good quality and duration of sleep are important to overall health and well-being. Patient also learns about sleep disorders and how they impact health along with recommendations to address them, including discussing with a physician.  Nutrition  Dining Out - Part 2 Clinical staff conducted group or individual video education with verbal and written material and guidebook.  Patient learns how to plan ahead and communicate in order to maximize their dining experience in a healthy and nutritious manner. Included are recommended food choices based on the type of restaurant the patient is visiting.   Fueling a Banker conducted group or individual video education with verbal and written material and guidebook.  There is a strong connection between our food choices and our health. Diseases like obesity and type 2 diabetes are very prevalent and are in large-part due to lifestyle choices. The Pritikin Eating Plan provides plenty of food and hunger-curbing satisfaction. It is easy to follow, affordable, and helps reduce health risks.  Menu Workshop  Clinical staff conducted group or individual video education with verbal and written material and guidebook.  Patient learns that restaurant meals can sabotage health goals because they are often packed with calories, fat, sodium, and sugar. Recommendations include strategies to plan ahead and to communicate with the manager, chef, or server to help order a healthier meal.  Planning Your Eating Strategy   Clinical staff conducted group or  individual video education with verbal and written material and guidebook.  Patient learns about the Pritikin Eating Plan and its benefit of reducing the risk of disease. The Pritikin Eating Plan does not focus on calories. Instead, it emphasizes high-quality, nutrient-rich foods. By knowing the characteristics of the foods, we choose, we can determine their calorie density and make informed decisions.  Targeting Your Nutrition Priorities  Clinical staff conducted group or individual video education with verbal and written material and guidebook.  Patient learns that lifestyle habits have a tremendous impact on disease risk and progression. This video provides eating and physical activity recommendations based on your personal health goals, such as reducing LDL cholesterol, losing weight, preventing or controlling type 2 diabetes, and reducing high blood pressure.  Vitamins and Minerals  Clinical staff conducted group or individual video education with verbal and written material and guidebook.  Patient learns different ways to obtain key vitamins and minerals, including through a recommended healthy diet. It is important to discuss all supplements you take with your doctor.   Healthy Mind-Set    Smoking Cessation  Clinical staff conducted group or individual video education with verbal and written material and guidebook.  Patient learns that cigarette smoking and tobacco addiction pose a serious health risk which affects millions of people. Stopping smoking will significantly reduce the risk of heart disease, lung disease, and many forms of cancer. Recommended strategies for quitting are covered, including working with your doctor to develop a successful plan.  Culinary   Becoming a Set designer conducted group or individual video education with verbal and written material and guidebook.  Patient learns that cooking at home can be healthy,  cost-effective, quick, and puts them in control. Keys to cooking healthy recipes will include looking at your recipe, assessing your equipment needs, planning ahead, making it simple, choosing cost-effective seasonal ingredients, and limiting the use of added fats, salts, and sugars.  Cooking - Breakfast and Snacks  Clinical staff conducted group or individual video education with verbal and written material and guidebook.  Patient learns how important breakfast is to satiety and nutrition through the entire day. Recommendations include key foods to eat during breakfast to help stabilize blood sugar levels and to prevent overeating at meals later in the day. Planning ahead is also a key component.  Cooking - Educational psychologist conducted group or individual video education with verbal and written material and guidebook.  Patient learns eating strategies to improve overall health, including an approach to cook more at home. Recommendations include thinking of animal protein as a side on your plate rather than center stage and focusing instead on lower calorie dense options like vegetables, fruits, whole grains, and plant-based proteins, such as beans. Making sauces in large quantities to freeze for later and leaving the skin on your vegetables are also recommended to maximize your experience.  Cooking - Healthy Salads and Dressing Clinical staff conducted group or individual video education with verbal and written material and guidebook.  Patient learns that vegetables, fruits, whole grains, and legumes are the foundations of the Pritikin Eating Plan. Recommendations include how to incorporate each of these in flavorful and healthy salads, and how to create homemade salad dressings. Proper handling of ingredients is also covered. Cooking - Soups and [employer] - Soups and Desserts Clinical staff conducted group or individual video education with verbal and written material and  guidebook.  Patient learns that Pritikin soups and desserts make for  easy, nutritious, and delicious snacks and meal components that are low in sodium, fat, sugar, and calorie density, while high in vitamins, minerals, and filling fiber. Recommendations include simple and healthy ideas for soups and desserts.   Overview     The Pritikin Solution Program Overview Clinical staff conducted group or individual video education with verbal and written material and guidebook.  Patient learns that the results of the Pritikin Program have been documented in more than 100 articles published in peer-reviewed journals, and the benefits include reducing risk factors for (and, in some cases, even reversing) high cholesterol, high blood pressure, type 2 diabetes, obesity, and more! An overview of the three key pillars of the Pritikin Program will be covered: eating well, doing regular exercise, and having a healthy mind-set.  WORKSHOPS  Exercise: Exercise Basics: Building Your Action Plan Clinical staff led group instruction and group discussion with PowerPoint presentation and patient guidebook. To enhance the learning environment the use of posters, models and videos may be added. At the conclusion of this workshop, patients will comprehend the difference between physical activity and exercise, as well as the benefits of incorporating both, into their routine. Patients will understand the FITT (Frequency, Intensity, Time, and Type) principle and how to use it to build an exercise action plan. In addition, safety concerns and other considerations for exercise and cardiac rehab will be addressed by the presenter. The purpose of this lesson is to promote a comprehensive and effective weekly exercise routine in order to improve patients' overall level of fitness.   Managing Heart Disease: Your Path to a Healthier Heart Clinical staff led group instruction and group discussion with PowerPoint presentation and  patient guidebook. To enhance the learning environment the use of posters, models and videos may be added.At the conclusion of this workshop, patients will understand the anatomy and physiology of the heart. Additionally, they will understand how Pritikin's three pillars impact the risk factors, the progression, and the management of heart disease.  The purpose of this lesson is to provide a high-level overview of the heart, heart disease, and how the Pritikin lifestyle positively impacts risk factors.  Exercise Biomechanics Clinical staff led group instruction and group discussion with PowerPoint presentation and patient guidebook. To enhance the learning environment the use of posters, models and videos may be added. Patients will learn how the structural parts of their bodies function and how these functions impact their daily activities, movement, and exercise. Patients will learn how to promote a neutral spine, learn how to manage pain, and identify ways to improve their physical movement in order to promote healthy living. The purpose of this lesson is to expose patients to common physical limitations that impact physical activity. Participants will learn practical ways to adapt and manage aches and pains, and to minimize their effect on regular exercise. Patients will learn how to maintain good posture while sitting, walking, and lifting.  Balance Training and Fall Prevention  Clinical staff led group instruction and group discussion with PowerPoint presentation and patient guidebook. To enhance the learning environment the use of posters, models and videos may be added. At the conclusion of this workshop, patients will understand the importance of their sensorimotor skills (vision, proprioception, and the vestibular system) in maintaining their ability to balance as they age. Patients will apply a variety of balancing exercises that are appropriate for their current level of function.  Patients will understand the common causes for poor balance, possible solutions to these problems, and ways to modify  their physical environment in order to minimize their fall risk. The purpose of this lesson is to teach patients about the importance of maintaining balance as they age and ways to minimize their risk of falling.  WORKSHOPS   Nutrition:  Fueling a Ship broker led group instruction and group discussion with PowerPoint presentation and patient guidebook. To enhance the learning environment the use of posters, models and videos may be added. Patients will review the foundational principles of the Pritikin Eating Plan and understand what constitutes a serving size in each of the food groups. Patients will also learn Pritikin-friendly foods that are better choices when away from home and review make-ahead meal and snack options. Calorie density will be reviewed and applied to three nutrition priorities: weight maintenance, weight loss, and weight gain. The purpose of this lesson is to reinforce (in a group setting) the key concepts around what patients are recommended to eat and how to apply these guidelines when away from home by planning and selecting Pritikin-friendly options. Patients will understand how calorie density may be adjusted for different weight management goals.  Mindful Eating  Clinical staff led group instruction and group discussion with PowerPoint presentation and patient guidebook. To enhance the learning environment the use of posters, models and videos may be added. Patients will briefly review the concepts of the Pritikin Eating Plan and the importance of low-calorie dense foods. The concept of mindful eating will be introduced as well as the importance of paying attention to internal hunger signals. Triggers for non-hunger eating and techniques for dealing with triggers will be explored. The purpose of this lesson is to provide patients with the  opportunity to review the basic principles of the Pritikin Eating Plan, discuss the value of eating mindfully and how to measure internal cues of hunger and fullness using the Hunger Scale. Patients will also discuss reasons for non-hunger eating and learn strategies to use for controlling emotional eating.  Targeting Your Nutrition Priorities Clinical staff led group instruction and group discussion with PowerPoint presentation and patient guidebook. To enhance the learning environment the use of posters, models and videos may be added. Patients will learn how to determine their genetic susceptibility to disease by reviewing their family history. Patients will gain insight into the importance of diet as part of an overall healthy lifestyle in mitigating the impact of genetics and other environmental insults. The purpose of this lesson is to provide patients with the opportunity to assess their personal nutrition priorities by looking at their family history, their own health history and current risk factors. Patients will also be able to discuss ways of prioritizing and modifying the Pritikin Eating Plan for their highest risk areas  Menu  Clinical staff led group instruction and group discussion with PowerPoint presentation and patient guidebook. To enhance the learning environment the use of posters, models and videos may be added. Using menus brought in from [employer], or printed from [employer], patients will apply the Pritikin dining out guidelines that were presented in the [employer] video. Patients will also be able to practice these guidelines in a variety of provided scenarios. The purpose of this lesson is to provide patients with the opportunity to practice hands-on learning of the Pritikin Dining Out guidelines with actual menus and practice scenarios.  Label Reading Clinical staff led group instruction and group discussion with PowerPoint presentation and  patient guidebook. To enhance the learning environment the use of posters, models and videos may be  added. Patients will review and discuss the Pritikin label reading guidelines presented in Pritikin's Label Reading Educational series video. Using fool labels brought in from local grocery stores and markets, patients will apply the label reading guidelines and determine if the packaged food meet the Pritikin guidelines. The purpose of this lesson is to provide patients with the opportunity to review, discuss, and practice hands-on learning of the Pritikin Label Reading guidelines with actual packaged food labels. Cooking School  Pritikin's [employer] are designed to teach patients ways to prepare quick, simple, and affordable recipes at home. The importance of nutrition's role in chronic disease risk reduction is reflected in its emphasis in the overall Pritikin program. By learning how to prepare essential core Pritikin Eating Plan recipes, patients will increase control over what they eat; be able to customize the flavor of foods without the use of added salt, sugar, or fat; and improve the quality of the food they consume. By learning a set of core recipes which are easily assembled, quickly prepared, and affordable, patients are more likely to prepare more healthy foods at home. These workshops focus on convenient breakfasts, simple entres, side dishes, and desserts which can be prepared with minimal effort and are consistent with nutrition recommendations for cardiovascular risk reduction. Cooking [employer] are taught by a Armed forces logistics/support/administrative officer (RD) who has been trained by the [employer]. The chef or RD has a clear understanding of the importance of minimizing - if not completely eliminating - added fat, sugar, and sodium in recipes. Throughout the series of Cooking School Workshop sessions, patients will learn about healthy ingredients and efficient methods of  cooking to build confidence in their capability to prepare    Cooking School weekly topics:  Adding Flavor- Sodium-Free  Fast and Healthy Breakfasts  Powerhouse Plant-Based Proteins  Satisfying Salads and Dressings  Simple Sides and Sauces  International Cuisine-Spotlight on the [employer] Zones  Delicious Desserts  Savory Soups  [employer] - Meals in a Astronomer Appetizers and Snacks  Comforting Weekend Breakfasts  One-Pot Wonders   Fast Evening Meals  Landscape architect Your Pritikin Plate  WORKSHOPS   Healthy Mindset (Psychosocial):  Focused Goals, Sustainable Changes Clinical staff led group instruction and group discussion with PowerPoint presentation and patient guidebook. To enhance the learning environment the use of posters, models and videos may be added. Patients will be able to apply effective goal setting strategies to establish at least one personal goal, and then take consistent, meaningful action toward that goal. They will learn to identify common barriers to achieving personal goals and develop strategies to overcome them. Patients will also gain an understanding of how our mind-set can impact our ability to achieve goals and the importance of cultivating a positive and growth-oriented mind-set. The purpose of this lesson is to provide patients with a deeper understanding of how to set and achieve personal goals, as well as the tools and strategies needed to overcome common obstacles which may arise along the way.  From Head to Heart: The Power of a Healthy Outlook  Clinical staff led group instruction and group discussion with PowerPoint presentation and patient guidebook. To enhance the learning environment the use of posters, models and videos may be added. Patients will be able to recognize and describe the impact of emotions and mood on physical health. They will discover the importance of self-care and explore self-care practices which may work  for them. Patients will also learn  how to utilize the 4 C's to cultivate a healthier outlook and better manage stress and challenges. The purpose of this lesson is to demonstrate to patients how a healthy outlook is an essential part of maintaining good health, especially as they continue their cardiac rehab journey.  Healthy Sleep for a Healthy Heart Clinical staff led group instruction and group discussion with PowerPoint presentation and patient guidebook. To enhance the learning environment the use of posters, models and videos may be added. At the conclusion of this workshop, patients will be able to demonstrate knowledge of the importance of sleep to overall health, well-being, and quality of life. They will understand the symptoms of, and treatments for, common sleep disorders. Patients will also be able to identify daytime and nighttime behaviors which impact sleep, and they will be able to apply these tools to help manage sleep-related challenges. The purpose of this lesson is to provide patients with a general overview of sleep and outline the importance of quality sleep. Patients will learn about a few of the most common sleep disorders. Patients will also be introduced to the concept of "sleep hygiene," and discover ways to self-manage certain sleeping problems through simple daily behavior changes. Finally, the workshop will motivate patients by clarifying the links between quality sleep and their goals of heart-healthy living.   Recognizing and Reducing Stress Clinical staff led group instruction and group discussion with PowerPoint presentation and patient guidebook. To enhance the learning environment the use of posters, models and videos may be added. At the conclusion of this workshop, patients will be able to understand the types of stress reactions, differentiate between acute and chronic stress, and recognize the impact that chronic stress has on their health. They will also be able to  apply different coping mechanisms, such as reframing negative self-talk. Patients will have the opportunity to practice a variety of stress management techniques, such as deep abdominal breathing, progressive muscle relaxation, and/or guided imagery.  The purpose of this lesson is to educate patients on the role of stress in their lives and to provide healthy techniques for coping with it.  Learning Barriers/Preferences:  Learning Barriers/Preferences - 03/01/23 1416       Learning Barriers/Preferences   Learning Barriers None    Learning Preferences Group Instruction;Individual Instruction;Pictoral;Video;Written Material;Skilled Demonstration;Computer/Internet             Education Topics:  Knowledge Questionnaire Score:  Knowledge Questionnaire Score - 03/01/23 1416       Knowledge Questionnaire Score   Pre Score 19/24             Core Components/Risk Factors/Patient Goals at Admission:  Personal Goals and Risk Factors at Admission - 03/01/23 1422       Core Components/Risk Factors/Patient Goals on Admission    Weight Management Yes;Obesity;Weight Loss    Intervention Weight Management: Develop a combined nutrition and exercise program designed to reach desired caloric intake, while maintaining appropriate intake of nutrient and fiber, sodium and fats, and appropriate energy expenditure required for the weight goal.;Weight Management: Provide education and appropriate resources to help participant work on and attain dietary goals.;Weight Management/Obesity: Establish reasonable short term and long term weight goals.;Obesity: Provide education and appropriate resources to help participant work on and attain dietary goals.    Admit Weight 220 lb 14.4 oz (100.2 kg)    Expected Outcomes Short Term: Continue to assess and modify interventions until short term weight is achieved;Long Term: Adherence to nutrition and physical activity/exercise program aimed toward attainment  of  established weight goal;Weight Loss: Understanding of general recommendations for a balanced deficit meal plan, which promotes 1-2 lb weight loss per week and includes a negative energy balance of [id number] kcal/d;Understanding recommendations for meals to include 15-35% energy as protein, 25-35% energy from fat, 35-60% energy from carbohydrates, less than 200mg  of dietary cholesterol, 20-35 gm of total fiber daily;Understanding of distribution of calorie intake throughout the day with the consumption of 4-5 meals/snacks    Hypertension Yes    Intervention Provide education on lifestyle modifcations including regular physical activity/exercise, weight management, moderate sodium restriction and increased consumption of fresh fruit, vegetables, and low fat dairy, alcohol moderation, and smoking cessation.;Monitor prescription use compliance.    Expected Outcomes Short Term: Continued assessment and intervention until BP is < 140/43mm HG in hypertensive participants. < 130/67mm HG in hypertensive participants with diabetes, heart failure or chronic kidney disease.;Long Term: Maintenance of blood pressure at goal levels.    Lipids Yes    Intervention Provide education and support for participant on nutrition & aerobic/resistive exercise along with prescribed medications to achieve LDL 70mg , HDL >40mg .    Expected Outcomes Short Term: Participant states understanding of desired cholesterol values and is compliant with medications prescribed. Participant is following exercise prescription and nutrition guidelines.;Long Term: Cholesterol controlled with medications as prescribed, with individualized exercise RX and with personalized nutrition plan. Value goals: LDL < 70mg , HDL > 40 mg.    Stress Yes    Intervention Offer individual and/or small group education and counseling on adjustment to heart disease, stress management and health-related lifestyle change. Teach and support self-help strategies.;Refer  participants experiencing significant psychosocial distress to appropriate mental health specialists for further evaluation and treatment. When possible, include family members and significant others in education/counseling sessions.    Expected Outcomes Short Term: Participant demonstrates changes in health-related behavior, relaxation and other stress management skills, ability to obtain effective social support, and compliance with psychotropic medications if prescribed.;Long Term: Emotional wellbeing is indicated by absence of clinically significant psychosocial distress or social isolation.    Personal Goal Other Yes    Personal Goal Pt would like to get back to normal, and have long life, know more about sternal precautions    Intervention Will continue to monitor pt and progress workloads as tolerated without sign or symptom    Expected Outcomes Pt will achieve his goals             Core Components/Risk Factors/Patient Goals Review:    Core Components/Risk Factors/Patient Goals at Discharge (Final Review):    ITP Comments:  ITP Comments     Row Name 03/01/23 1100           ITP Comments Dr. Armanda Magic medical director. Introduction to [facility] education / intensive cardiac rehab. Initial orientation packet reviewed with patient.                Comments: Participant attended orientation for the cardiac rehabilitation program on  03/01/2023  to perform initial intake and exercise walk test. Patient introduced to the [facility] education and orientation packet was reviewed. Completed 6-minute walk test, measurements, initial ITP, and exercise prescription. Vital signs stable. Telemetry-normal sinus rhythm, mildly symptomatic, some mild incisional pain (to the right side of the incision) 2/10, resolved with rest post walk test. Nurse aware, no other issues.  Service time was from 0917 to 1145.

## 2023-03-05 ENCOUNTER — Ambulatory Visit (HOSPITAL_COMMUNITY): Payer: No Typology Code available for payment source

## 2023-03-05 ENCOUNTER — Encounter (HOSPITAL_COMMUNITY)
Admission: RE | Admit: 2023-03-05 | Discharge: 2023-03-05 | Disposition: A | Discharge status: Home / Self Care | Payer: No Typology Code available for payment source | Source: Ambulatory Visit | Attending: Cardiology

## 2023-03-05 DIAGNOSIS — Z951 Presence of aortocoronary bypass graft: Secondary | ICD-10-CM

## 2023-03-05 NOTE — Progress Notes (Signed)
Daily Session Note  Patient Details  Name: Christopher [name] MRN: [id number] Date of Birth: 1966/05/09 Referring Provider:   Flowsheet Row INTENSIVE CARDIAC REHAB ORIENT from 03/01/2023 in [facility]  Referring Provider Dr. Carleene Cooper, MD (Dr. Armanda Magic covering)       Encounter Date: 03/05/2023  Check In:  Session Check In - 03/05/23 0717       Check-In   Supervising physician immediately available to respond to emergencies [facility] MD immediately available    Physician(s) Joni Reining, NP    Location [facility]    Staff Present Hughie Closs BS, ACSM-CEP, Exercise Physiologist;Maria Whitaker, RN, Doris Cheadle, MS, ACSM-CEP, Exercise Physiologist;Bailey Wallace Cullens, MS, Exercise Physiologist;Johnny Hale Bogus, MS, Exercise Physiologist    Virtual Visit No    Medication changes reported     No    Fall or balance concerns reported    No    Tobacco Cessation No Change    Warm-up and Cool-down Performed as group-led instruction    Resistance Training Performed Yes    VAD Patient? No    PAD/SET Patient? No      Pain Assessment   Currently in Pain? No/denies    Multiple Pain Sites No             Capillary Blood Glucose: No results found for this or any previous visit (from the past 24 hour(s)).   Exercise Prescription Changes - 03/05/23 0837       Response to Exercise   Blood Pressure (Admit) 140/82    Blood Pressure (Exercise) 140/88    Blood Pressure (Exit) 110/60    Heart Rate (Admit) 78 bpm    Heart Rate (Exercise) 92 bpm    Heart Rate (Exit) 73 bpm    Rating of Perceived Exertion (Exercise) 10    Perceived Dyspnea (Exercise) 0    Symptoms 0    Comments Pt first day in the [facility] program    Duration Progress to 30 minutes of  aerobic without signs/symptoms of physical distress    Intensity THRR unchanged      Progression   Progression Continue to progress workloads to maintain  intensity without signs/symptoms of physical distress.    Average METs 1.95      Resistance Training   Training Prescription Yes    Weight 3    Reps 10-15    Time 10 Minutes      Recumbant Bike   Level 2    RPM 65    Watts 25    Minutes 15    METs 2.1      NuStep   Level 2    SPM 70    Minutes 15    METs 1.8             Social History   Tobacco Use  Smoking Status Never  Smokeless Tobacco Never    Goals Met:  Independence with exercise equipment Exercise tolerated well No report of concerns or symptoms today Strength training completed today  Goals Unmet:  Not Applicable  Comments: Pt in today for cardiac rehab today. Pt tolerated light exercise without difficulty. VSS, telemetry-Sinus rhythm. Medication list reconciled at orientation and reports no changes today. Pt denies barriers to medication compliance.  PSYCHOSOCIAL ASSESSMENT:  PHQ9=0. Pt exhibits positive coping skills and has support from his wife. No psychosocial needs identified at this time, no psychosocial interventions necessary. Pt oriented to exercise equipment and gym routine.  Understanding verbalized.    Dr. Armanda Magic is Medical Director for Cardiac Rehab at [facility].

## 2023-03-07 ENCOUNTER — Ambulatory Visit: Payer: No Typology Code available for payment source

## 2023-03-07 ENCOUNTER — Encounter (HOSPITAL_COMMUNITY)
Admission: RE | Admit: 2023-03-07 | Discharge: 2023-03-07 | Disposition: A | Discharge status: Home / Self Care | Payer: No Typology Code available for payment source | Source: Ambulatory Visit | Attending: Cardiology | Admitting: Cardiology

## 2023-03-07 ENCOUNTER — Ambulatory Visit (HOSPITAL_COMMUNITY): Payer: No Typology Code available for payment source

## 2023-03-07 DIAGNOSIS — Z951 Presence of aortocoronary bypass graft: Secondary | ICD-10-CM

## 2023-03-09 ENCOUNTER — Encounter (HOSPITAL_COMMUNITY)
Admission: RE | Admit: 2023-03-09 | Discharge: 2023-03-09 | Disposition: A | Discharge status: Home / Self Care | Payer: No Typology Code available for payment source | Source: Ambulatory Visit | Attending: Cardiology | Admitting: Cardiology

## 2023-03-09 ENCOUNTER — Ambulatory Visit (HOSPITAL_COMMUNITY): Payer: No Typology Code available for payment source

## 2023-03-09 DIAGNOSIS — Z951 Presence of aortocoronary bypass graft: Secondary | ICD-10-CM | POA: Diagnosis not present

## 2023-03-12 ENCOUNTER — Encounter (HOSPITAL_COMMUNITY)
Admission: RE | Admit: 2023-03-12 | Discharge: 2023-03-12 | Disposition: A | Discharge status: Home / Self Care | Payer: No Typology Code available for payment source | Source: Ambulatory Visit | Attending: Cardiology

## 2023-03-12 ENCOUNTER — Ambulatory Visit (HOSPITAL_COMMUNITY): Payer: No Typology Code available for payment source

## 2023-03-12 DIAGNOSIS — Z951 Presence of aortocoronary bypass graft: Secondary | ICD-10-CM | POA: Diagnosis not present

## 2023-03-14 ENCOUNTER — Ambulatory Visit (HOSPITAL_COMMUNITY): Payer: No Typology Code available for payment source

## 2023-03-14 ENCOUNTER — Encounter (HOSPITAL_COMMUNITY)
Admission: RE | Admit: 2023-03-14 | Discharge: 2023-03-14 | Disposition: A | Discharge status: Home / Self Care | Payer: No Typology Code available for payment source | Source: Ambulatory Visit | Attending: Cardiology

## 2023-03-14 DIAGNOSIS — Z951 Presence of aortocoronary bypass graft: Secondary | ICD-10-CM | POA: Diagnosis not present

## 2023-03-15 NOTE — Progress Notes (Signed)
Cardiac Individual Treatment Plan  Patient Details  Name: Christopher [name] MRN: [id number] Date of Birth: October 28, 1965 Referring Provider:   Flowsheet Row INTENSIVE CARDIAC REHAB ORIENT from 03/01/2023 in [facility]  Referring Provider Dr. Carleene Cooper, MD (Dr. 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  Patient's Home Medications on Admission:  Current Outpatient Medications:    acetaminophen (TYLENOL) 500 MG tablet, Take 2 tablets (1,000 mg total) by mouth 4 (four) times daily., Disp: 100 tablet, Rfl: 0   amLODipine (NORVASC) 2.5 MG tablet, Take 1 tablet (2.5 mg total) by mouth daily., Disp: 30 tablet, Rfl: 1   cetirizine (ZYRTEC) 10 MG tablet, Take 10 mg by mouth daily., Disp: , Rfl:    chlorzoxazone (PARAFON) 500 MG tablet, Take 500 mg by mouth 3 (three) times daily as needed for muscle spasms., Disp: , Rfl:    clobetasol ointment (TEMOVATE) 0.05 %, Apply 1 Application topically 2 (two) times daily as needed (dermatitis flare)., Disp: , Rfl:    furosemide (LASIX) 40 MG tablet, Take 1 tablet (40 mg total) by mouth daily. (Patient not taking: Reported on 03/01/2023), Disp: 5 tablet, Rfl: 0   latanoprost (XALATAN) 0.005 % ophthalmic solution, Place 1 drop into both eyes at bedtime., Disp: , Rfl:    metoprolol succinate (TOPROL-XL) 50 MG 24 hr tablet, Take 25 mg by mouth daily., Disp: , Rfl:    nitroGLYCERIN (NITROSTAT) 0.3 MG SL tablet, Place 1 tablet (0.3 mg total) under the tongue every 5 (five) minutes as needed for chest pain., Disp: 90 tablet, Rfl: 0   Polyvinyl Alcohol-Povidone (REFRESH OP), Place 1 drop into both eyes daily., Disp: , Rfl:    rosuvastatin (CRESTOR) 40 MG tablet, Take 1 tablet (40 mg total) by mouth daily.,  Disp: 30 tablet, Rfl: 2   Simethicone (GAS-X PO), Take 2 tablets by mouth daily as needed (gas)., Disp: , Rfl:    triamcinolone ointment (KENALOG) 0.1 %, Apply 1 Application topically 2 (two) times daily as needed (dermatitis flare)., Disp: , Rfl:   Past Medical History: Past Medical History:  Diagnosis Date   Allergy    Anginal pain (HCC)    with exertion   Back pain    Coronary artery disease    Fatty liver    GERD (gastroesophageal reflux disease)    Glaucoma    Hypertension    MRSA carrier    Sleep apnea    On CPAP    Tobacco Use: Social History   Tobacco Use  Smoking Status Never  Smokeless Tobacco Never    Labs: Review Flowsheet  More data exists      Latest Ref Rng & Units 09/10/2017 10/31/2017 01/09/2023 01/17/2023 01/18/2023  Labs for ITP Cardiac and Pulmonary Rehab  Cholestrol 0 - 200 mg/dL 409  811  - - 61   LDL (calc) 0 - 99 mg/dL 914  782  - - 26   HDL-C >40 mg/dL 31  38  - - 19   Trlycerides <150 mg/dL 956  213  - - 79   Hemoglobin A1c 4.8 - 5.6 % - 5.5  5.7  - -  PH, Arterial 7.35 - 7.45 - - 7.42  7.301  7.234  7.281  7.275  7.323  7.314  7.317  -  PCO2 arterial 32 - 48 mmHg - - 36  45.9  28.6  42.5  39.6  46.0  38.4  44.4  -  Bicarbonate 20.0 - 28.0 mmol/L - - 23.4  22.4  11.9  20.0  18.4  23.9  19.5  22.7  -  TCO2 22 - 32 mmol/L - - - 24  13  21  20  23  25  24  21  21  22  22  24   -  Acid-base deficit 0.0 - 2.0 mmol/L - - 0.7  4.0  14.0  6.0  8.0  2.0  6.0  4.0  -  O2 Saturation % - - 100  96  97  94  96  99  100  99  -    Details       Multiple values from one day are sorted in reverse-chronological order         Capillary Blood Glucose: Lab Results  Component Value Date   GLUCAP 97 01/23/2023   GLUCAP 113 (H) 01/22/2023   GLUCAP 82 01/22/2023   GLUCAP 86 01/22/2023   GLUCAP 98 01/22/2023     Exercise Target Goals: Exercise Program Goal: Individual exercise prescription set using results from initial 6 min walk test and THRR while  considering  patient's activity barriers and safety.   Exercise Prescription Goal: Initial exercise prescription builds to 30-45 minutes a day of aerobic activity, 2-3 days per week.  Home exercise guidelines will be given to patient during program as part of exercise prescription that the participant will acknowledge.  Activity Barriers & Risk Stratification:  Activity Barriers & Cardiac Risk Stratification - 03/01/23 1401       Activity Barriers & Cardiac Risk Stratification   Activity Barriers Deconditioning;Back Problems;Neck/Spine Problems    Cardiac Risk Stratification High   Under 5 MET's on            6 Minute Walk:  6 Minute Walk     Row Name 03/01/23 1359         6 Minute Walk   Phase Initial     Distance 1265 feet     Walk Time 6 minutes     # of Rest Breaks 0     MPH 2.4     METS 3.16     RPE 7     Perceived Dyspnea  0     VO2 Peak 11.06     Symptoms Yes (comment)     Comments incision site pain 2/10, right side of scar, resolved with rest     Resting HR 75 bpm     Resting BP 140/72     Resting Oxygen Saturation  99 %     Exercise Oxygen Saturation  during 6 min walk 100 %     Max Ex. HR 92 bpm     Max Ex. BP 150/84     2 Minute Post BP 148/82              Oxygen Initial Assessment:   Oxygen Re-Evaluation:   Oxygen Discharge (Final Oxygen Re-Evaluation):   Initial Exercise Prescription:  Initial Exercise Prescription - 03/01/23 1400       Date of Initial Exercise RX and Referring Provider   Date 03/01/23    Referring Provider Dr. Carleene Cooper, MD (Dr. Armanda Magic covering)    Expected Discharge Date 05/21/23      Recumbant Bike  Level 2    RPM 60    Watts 15    Minutes 15    METs 1.9      NuStep   Level 2    SPM 80    Minutes 15    METs 2      Prescription Details   Frequency (times per week) 3    Duration Progress to 30 minutes of continuous aerobic without signs/symptoms of physical distress      Intensity    THRR 40-80% of Max Heartrate 66-132    Ratings of Perceived Exertion 11-13    Perceived Dyspnea 0-4      Progression   Progression Continue progressive overload as per policy without signs/symptoms or physical distress.      Resistance Training   Training Prescription Yes    Weight 3    Reps 10-15             Perform Capillary Blood Glucose checks as needed.  Exercise Prescription Changes:   Exercise Prescription Changes     Row Name 03/05/23 [phone]             Response to Exercise   Blood Pressure (Admit) 140/82       Blood Pressure (Exercise) 140/88       Blood Pressure (Exit) 110/60       Heart Rate (Admit) 78 bpm       Heart Rate (Exercise) 92 bpm       Heart Rate (Exit) 73 bpm       Rating of Perceived Exertion (Exercise) 10       Perceived Dyspnea (Exercise) 0       Symptoms 0       Comments Pt first day in the Prit[facility]in ICR program       Duration Progress to 30 minutes of  aerobic without signs/symptoms of physical distress       Intensity THRR unchanged         Progression   Progression Continue to progress workloads to maintain intensity without signs/symptoms of physical distress.       Average METs 1.95         Resistance Training   Training Prescription Yes       Weight 3       Reps 10-15       Time 10 Minutes         Recumbant Bike   Level 2       RPM 65       Watts 25       Minutes 15       METs 2.1         NuStep   Level 2       SPM 70       Minutes 15       METs 1.8                Exercise Comments:   Exercise Comments     Row Name 03/05/23 0844           Exercise Comments Pt first day in the Pritikin ICR program. Pt tolerated exercise well with an average MET level of 1.95. Pt is learning his THRR, RPE and ExRx. Off to a great start.                Exercise Goals and Review:   Exercise Goals     Row Name 03/01/23 1409  Exercise Goals   Increase Physical Activity Yes       Intervention  Provide advice, education, support and counseling about physical activity/exercise needs.;Develop an individualized exercise prescription for aerobic and resistive training based on initial evaluation findings, risk stratification, comorbidities and participant's personal goals.       Expected Outcomes Short Term: Attend rehab on a regular basis to increase amount of physical activity.;Long Term: Exercising regularly at least 3-5 days a week.;Long Term: Add in home exercise to make exercise part of routine and to increase amount of physical activity.       Increase Strength and Stamina Yes       Intervention Provide advice, education, support and counseling about physical activity/exercise needs.;Develop an individualized exercise prescription for aerobic and resistive training based on initial evaluation findings, risk stratification, comorbidities and participant's personal goals.       Expected Outcomes Short Term: Increase workloads from initial exercise prescription for resistance, speed, and METs.;Short Term: Perform resistance training exercises routinely during rehab and add in resistance training at home;Long Term: Improve cardiorespiratory fitness, muscular endurance and strength as measured by increased METs and functional capacity ( )       Able to understand and use rate of perceived exertion (RPE) scale Yes       Intervention Provide education and explanation on how to use RPE scale       Expected Outcomes Short Term: Able to use RPE daily in rehab to express subjective intensity level;Long Term:  Able to use RPE to guide intensity level when exercising independently       Knowledge and understanding of Target Heart Rate Range (THRR) Yes       Intervention Provide education and explanation of THRR including how the numbers were predicted and where they are located for reference       Expected Outcomes Short Term: Able to state/look up THRR;Long Term: Able to use THRR to govern intensity  when exercising independently;Short Term: Able to use daily as guideline for intensity in rehab       Understanding of Exercise Prescription Yes       Intervention Provide education, explanation, and written materials on patient's individual exercise prescription       Expected Outcomes Short Term: Able to explain program exercise prescription;Long Term: Able to explain home exercise prescription to exercise independently                Exercise Goals Re-Evaluation :  Exercise Goals Re-Evaluation     Row Name 03/05/23 0842             Exercise Goal Re-Evaluation   Exercise Goals Review Increase Physical Activity;Understanding of Exercise Prescription;Increase Strength and Stamina;Knowledge and understanding of Target Heart Rate Range (THRR);Able to understand and use rate of perceived exertion (RPE) scale       Comments Pt first day in the Prit[facility]in ICR program. Pt tolerated exercise well with an average MET level of 1.95. Pt is learning his THRR, RPE and ExRx. Off to a great start.       Expected Outcomes Will continue to monitor pt and progress workloads as tolerated without sign or symptom                Discharge Exercise Prescription (Final Exercise Prescription Changes):  Exercise Prescription Changes - 03/05/23 0837       Response to Exercise   Blood Pressure (Admit) 140/82    Blood Pressure (Exercise) 140/88    Blood Pressure (Exit) 110/60  Heart Rate (Admit) 78 bpm    Heart Rate (Exercise) 92 bpm    Heart Rate (Exit) 73 bpm    Rating of Perceived Exertion (Exercise) 10    Perceived Dyspnea (Exercise) 0    Symptoms 0    Comments Pt first day in the Prit[facility]in ICR program    Duration Progress to 30 minutes of  aerobic without signs/symptoms of physical distress    Intensity THRR unchanged      Progression   Progression Continue to progress workloads to maintain intensity without signs/symptoms of physical distress.    Average METs 1.95      Resistance  Training   Training Prescription Yes    Weight 3    Reps 10-15    Time 10 Minutes      Recumbant Bike   Level 2    RPM 65    Watts 25    Minutes 15    METs 2.1      NuStep   Level 2    SPM 70    Minutes 15    METs 1.8             Nutrition:  Target Goals: Understanding of nutrition guidelines, daily intake of sodium 1500mg , cholesterol 200mg , calories 30% from fat and 7% or less from saturated fats, daily to have 5 or more servings of fruits and vegetables.  Biometrics:  Pre Biometrics - 03/01/23 1410       Pre Biometrics   Height 5\' 5"  (1.651 m)    Weight 100.6 kg    Waist Circumference 46.75 inches    Hip Circumference 41 inches    Waist to Hip Ratio 1.14 %    BMI (Calculated) 36.91    Triceps Skinfold 12 mm    % Body Fat 33.3 %    Grip Strength 42 kg    Flexibility 5 in    Single Leg Stand 30 seconds              Nutrition Therapy Plan and Nutrition Goals:  Nutrition Therapy & Goals - 03/05/23 0956       Nutrition Therapy   Diet Heart Healthy Diet    Drug/Food Interactions Statins/Certain Fruits      Personal Nutrition Goals   Nutrition Goal Patient to identify strategies for reducing cardiovascular risk by attending the [facility] education and nutrition series weekly.    Personal Goal #2 Patient to improve diet quality by using the plate method as a guide for meal planning to include lean protein/plant protein, fruits, vegetables, whole grains, nonfat dairy as part of a well-balanced diet.    Comments Hakeem has medical history of CABGx4, HTN, CAD, OSA. His cholesterol is well controlled. His A1c remains in a pre-diabetic range. Patient will benefit from participation in intensive cardiac rehab for nutrition, exercise, and lifestyle modification.      Intervention Plan   Intervention Prescribe, educate and counsel regarding individualized specific dietary modifications aiming towards targeted core components such as weight, hypertension, lipid  management, diabetes, heart failure and other comorbidities.;Nutrition handout(s) given to patient.    Expected Outcomes Short Term Goal: Understand basic principles of dietary content, such as calories, fat, sodium, cholesterol and nutrients.;Long Term Goal: Adherence to prescribed nutrition plan.             Nutrition Assessments:  Nutrition Assessments - 03/05/23 1126       Rate Your Plate Scores   Pre Score 60  MEDIFICTS Score Key: >=70 Need to make dietary changes  40-70 Heart Healthy Diet <= 40 Therapeutic Level Cholesterol Diet   Flowsheet Row INTENSIVE CARDIAC REHAB from 03/05/2023 in [facility]  Picture Your Plate Total Score on Admission 60      Picture Your Plate Scores: <29 Unhealthy dietary pattern with much room for improvement. 41-50 Dietary pattern unlikely to meet recommendations for good health and room for improvement. 51-60 More healthful dietary pattern, with some room for improvement.  >60 Healthy dietary pattern, although there may be some specific behaviors that could be improved.    Nutrition Goals Re-Evaluation:  Nutrition Goals Re-Evaluation     Row Name 03/05/23 0956             Goals   Current Weight 222 lb 10.6 oz (101 kg)       Comment HDL 19, LDL 26, A1c 5.7       Expected Outcome Jashaun has medical history of CABGx4, HTN, CAD, OSA. His cholesterol is well controlled. His A1c remains in a pre-diabetic range. Patient will benefit from participation in intensive cardiac rehab for nutrition, exercise, and lifestyle modification.                Nutrition Goals Re-Evaluation:  Nutrition Goals Re-Evaluation     Row Name 03/05/23 0956             Goals   Current Weight 222 lb 10.6 oz (101 kg)       Comment HDL 19, LDL 26, A1c 5.7       Expected Outcome Gram has medical history of CABGx4, HTN, CAD, OSA. His cholesterol is well controlled. His A1c remains  in a pre-diabetic range. Patient will benefit from participation in intensive cardiac rehab for nutrition, exercise, and lifestyle modification.                Nutrition Goals Discharge (Final Nutrition Goals Re-Evaluation):  Nutrition Goals Re-Evaluation - 03/05/23 0956       Goals   Current Weight 222 lb 10.6 oz (101 kg)    Comment HDL 19, LDL 26, A1c 5.7    Expected Outcome Rakan has medical history of CABGx4, HTN, CAD, OSA. His cholesterol is well controlled. His A1c remains in a pre-diabetic range. Patient will benefit from participation in intensive cardiac rehab for nutrition, exercise, and lifestyle modification.             Psychosocial: Target Goals: Acknowledge presence or absence of significant depression and/or stress, maximize coping skills, provide positive support system. Participant is able to verbalize types and ability to use techniques and skills needed for reducing stress and depression.  Initial Review & Psychosocial Screening:  Initial Psych Review & Screening - 03/01/23 1412       Initial Review   Current issues with Current Stress Concerns   some mild stress with his health and his wifes health concerns. They are also planning a move. Does not feel the need for any additional resources at this time.   Source of Stress Concerns Family      Family Dynamics   Good Support System? Yes   Wife is good support     Barriers   Psychosocial barriers to participate in program The patient should benefit from training in stress management and relaxation.      Screening Interventions   Interventions Encouraged to exercise;Provide feedback about the scores to participant    Expected Outcomes  Long Term Goal: Stressors or current issues are controlled or eliminated.;Short Term goal: Identification and review with participant of any Quality of Life or Depression concerns found by scoring the questionnaire.;Long Term goal: The participant improves quality of Life and  PHQ9 Scores as seen by post scores and/or verbalization of changes             Quality of Life Scores:  Quality of Life - 03/01/23 1415       Quality of Life   Select Quality of Life      Quality of Life Scores   Health/Function Pre 13.93 %    Socioeconomic Pre 26.67 %    Psych/Spiritual Pre 20 %    Family Pre 20.4 %    GLOBAL Pre 18.52 %            Scores of 19 and below usually indicate a poorer quality of life in these areas.  A difference of  2-3 points is a clinically meaningful difference.  A difference of 2-3 points in the total score of the Quality of Life Index has been associated with significant improvement in overall quality of life, self-image, physical symptoms, and general health in studies assessing change in quality of life.  PHQ-9: Review Flowsheet  More data exists      03/01/2023 05/06/2018 03/14/2018 10/31/2017 09/10/2017  Depression screen PHQ 2/9  Decreased Interest 0 0 0 0 0  Down, Depressed, Hopeless 0 0 0 0 0  PHQ - 2 Score 0 0 0 0 0  Altered sleeping 0 - - - -  Tired, decreased energy 0 - - - -  Change in appetite 0 - - - -  Feeling bad or failure about yourself  0 - - - -  Trouble concentrating 0 - - - -  Moving slowly or fidgety/restless 0 - - - -  Suicidal thoughts 0 - - - -  PHQ-9 Score 0 - - - -    Details           Interpretation of Total Score  Total Score Depression Severity:  1-4 = Minimal depression, 5-9 = Mild depression, 10-14 = Moderate depression, 15-19 = Moderately severe depression, 20-27 = Severe depression   Psychosocial Evaluation and Intervention:   Psychosocial Re-Evaluation:  Psychosocial Re-Evaluation     Row Name 03/15/23 0956             Psychosocial Re-Evaluation   Current issues with Current Stress Concerns       Comments Khylan started cardiac rehab on 03/05/23. Coery has not voiced any increased concersn or stressors during exercise. Will review quality of life questionnaire in the upcoming week        Expected Outcomes Leonell will have controlled or decreased stress upon completing cardiac rehab       Interventions Encouraged to attend Cardiac Rehabilitation for the exercise;Relaxation education;Encouraged to attend Pulmonary Rehabilitation for the exercise       Continue Psychosocial Services  Follow up required by staff         Initial Review   Source of Stress Concerns Family       Comments will continue to monitor and offer support as needed                Psychosocial Discharge (Final Psychosocial Re-Evaluation):  Psychosocial Re-Evaluation - 03/15/23 0956       Psychosocial Re-Evaluation   Current issues with Current Stress Concerns    Comments Amonte started cardiac rehab on 03/05/23.  Coery has not voiced any increased concersn or stressors during exercise. Will review quality of life questionnaire in the upcoming week    Expected Outcomes Toy will have controlled or decreased stress upon completing cardiac rehab    Interventions Encouraged to attend Cardiac Rehabilitation for the exercise;Relaxation education;Encouraged to attend Pulmonary Rehabilitation for the exercise    Continue Psychosocial Services  Follow up required by staff      Initial Review   Source of Stress Concerns Family    Comments will continue to monitor and offer support as needed             Vocational Rehabilitation: Provide vocational rehab assistance to qualifying candidates.   Vocational Rehab Evaluation & Intervention:  Vocational Rehab - 03/01/23 1420       Initial Vocational Rehab Evaluation & Intervention   Assessment shows need for Vocational Rehabilitation No   No needs at this time            Education: Education Goals: Education classes will be provided on a weekly basis, covering required topics. Participant will state understanding/return demonstration of topics presented.    Education     Row Name 03/12/23 0800     Education   Cardiac Education Topics  Pritikin   Select Core Videos     Core Videos   Educator Exercise Physiologist   Select Exercise Education   Exercise Education Biomechanial Limitations   Instruction Review Code 1- Verbalizes Understanding   Class Start Time 0815   Class Stop Time 0855   Class Time Calculation (min) 40 min            Core Videos: Exercise    Move It!  Clinical staff conducted group or individual video education with verbal and written material and guidebook.  Patient learns the recommended Pritikin exercise program. Exercise with the goal of living a long, healthy life. Some of the health benefits of exercise include controlled diabetes, healthier blood pressure levels, improved cholesterol levels, improved heart and lung capacity, improved sleep, and better body composition. Everyone should speak with their doctor before starting or changing an exercise routine.  Biomechanical Limitations Clinical staff conducted group or individual video education with verbal and written material and guidebook.  Patient learns how biomechanical limitations can impact exercise and how we can mitigate and possibly overcome limitations to have an impactful and balanced exercise routine.  Body Composition Clinical staff conducted group or individual video education with verbal and written material and guidebook.  Patient learns that body composition (ratio of muscle mass to fat mass) is a key component to assessing overall fitness, rather than body weight alone. Increased fat mass, especially visceral belly fat, can put [place] at increased risk for metabolic syndrome, type 2 diabetes, heart disease, and even death. It is recommended to combine diet and exercise (cardiovascular and resistance training) to improve your body composition. Seek guidance from your physician and exercise physiologist before implementing an exercise routine.  Exercise Action Plan Clinical staff conducted group or individual video education with  verbal and written material and guidebook.  Patient learns the recommended strategies to achieve and enjoy long-term exercise adherence, including variety, self-motivation, self-efficacy, and positive decision making. Benefits of exercise include fitness, good health, weight management, more energy, better sleep, less stress, and overall well-being.  Medical   Heart Disease Risk Reduction Clinical staff conducted group or individual video education with verbal and written material and guidebook.  Patient learns our heart is our most vital organ as  it circulates oxygen, nutrients, white blood cells, and hormones throughout the entire body, and carries waste away. Data supports a plant-based eating plan like the Pritikin Program for its effectiveness in slowing progression of and reversing heart disease. The video provides a number of recommendations to address heart disease.   Metabolic Syndrome and Belly Fat  Clinical staff conducted group or individual video education with verbal and written material and guidebook.  Patient learns what metabolic syndrome is, how it leads to heart disease, and how one can reverse it and keep it from coming back. You have metabolic syndrome if you have 3 of the following 5 criteria: abdominal obesity, high blood pressure, high triglycerides, low HDL cholesterol, and high blood sugar.  Hypertension and Heart Disease Clinical staff conducted group or individual video education with verbal and written material and guidebook.  Patient learns that high blood pressure, or hypertension, is very common in the [place]. Hypertension is largely due to excessive salt intake, but other important risk factors include being overweight, physical inactivity, drinking too much alcohol, smoking, and not eating enough potassium from fruits and vegetables. High blood pressure is a leading risk factor for heart attack, stroke, congestive heart failure, dementia, kidney failure, and  premature death. Long-term effects of excessive salt intake include stiffening of the arteries and thickening of heart muscle and organ damage. Recommendations include ways to reduce hypertension and the risk of heart disease.  Diseases of Our Time - Focusing on Diabetes Clinical staff conducted group or individual video education with verbal and written material and guidebook.  Patient learns why the best way to stop diseases of our time is prevention, through food and other lifestyle changes. Medicine (such as prescription pills and surgeries) is often only a Band-Aid on the problem, not a long-term solution. Most common diseases of our time include obesity, type 2 diabetes, hypertension, heart disease, and cancer. The Pritikin Program is recommended and has been proven to help reduce, reverse, and/or prevent the damaging effects of metabolic syndrome.  Nutrition   Overview of the Pritikin Eating Plan  Clinical staff conducted group or individual video education with verbal and written material and guidebook.  Patient learns about the Pritikin Eating Plan for disease risk reduction. The Pritikin Eating Plan emphasizes a wide variety of unrefined, minimally-processed carbohydrates, like fruits, vegetables, whole grains, and legumes. Go, Caution, and Stop food choices are explained. Plant-based and lean animal proteins are emphasized. Rationale provided for low sodium intake for blood pressure control, low added sugars for blood sugar stabilization, and low added fats and oils for coronary artery disease risk reduction and weight management.  Calorie Density  Clinical staff conducted group or individual video education with verbal and written material and guidebook.  Patient learns about calorie density and how it impacts the Pritikin Eating Plan. Knowing the characteristics of the food you choose will help you decide whether those foods will lead to weight gain or weight loss, and whether you want to  consume more or less of them. Weight loss is usually a side effect of the Pritikin Eating Plan because of its focus on low calorie-dense foods.  Label Reading  Clinical staff conducted group or individual video education with verbal and written material and guidebook.  Patient learns about the Pritikin recommended label reading guidelines and corresponding recommendations regarding calorie density, added sugars, sodium content, and whole grains.  Dining Out - Part 1  Clinical staff conducted group or individual video education with verbal and written material  and guidebook.  Patient learns that restaurant meals can be sabotaging because they can be so high in calories, fat, sodium, and/or sugar. Patient learns recommended strategies on how to positively address this and avoid unhealthy pitfalls.  Facts on Fats  Clinical staff conducted group or individual video education with verbal and written material and guidebook.  Patient learns that lifestyle modifications can be just as effective, if not more so, as many medications for lowering your risk of heart disease. A Pritikin lifestyle can help to reduce your risk of inflammation and atherosclerosis (cholesterol build-up, or plaque, in the artery walls). Lifestyle interventions such as dietary choices and physical activity address the cause of atherosclerosis. A review of the types of fats and their impact on blood cholesterol levels, along with dietary recommendations to reduce fat intake is also included.  Nutrition Action Plan  Clinical staff conducted group or individual video education with verbal and written material and guidebook.  Patient learns how to incorporate Pritikin recommendations into their lifestyle. Recommendations include planning and keeping personal health goals in mind as an important part of their success.  Healthy Mind-Set    Healthy Minds, Bodies, Hearts  Clinical staff conducted group or individual video education with  verbal and written material and guidebook.  Patient learns how to identify when they are stressed. Video will discuss the impact of that stress, as well as the many benefits of stress management. Patient will also be introduced to stress management techniques. The way we think, act, and feel has an impact on our hearts.  How Our Thoughts Can Heal Our Hearts  Clinical staff conducted group or individual video education with verbal and written material and guidebook.  Patient learns that negative thoughts can cause depression and anxiety. This can result in negative lifestyle behavior and serious health problems. Cognitive behavioral therapy is an effective method to help control our thoughts in order to change and improve our emotional outlook.  Additional Videos:  Exercise    Improving Performance  Clinical staff conducted group or individual video education with verbal and written material and guidebook.  Patient learns to use a non-linear approach by alternating intensity levels and lengths of time spent exercising to help burn more calories and lose more body fat. Cardiovascular exercise helps improve heart health, metabolism, hormonal balance, blood sugar control, and recovery from fatigue. Resistance training improves strength, endurance, balance, coordination, reaction time, metabolism, and muscle mass. Flexibility exercise improves circulation, posture, and balance. Seek guidance from your physician and exercise physiologist before implementing an exercise routine and learn your capabilities and proper form for all exercise.  Introduction to Yoga  Clinical staff conducted group or individual video education with verbal and written material and guidebook.  Patient learns about yoga, a discipline of the coming together of mind, breath, and body. The benefits of yoga include improved flexibility, improved range of motion, better posture and core strength, increased lung function, weight loss, and  positive self-image. Yoga's heart health benefits include lowered blood pressure, healthier heart rate, decreased cholesterol and triglyceride levels, improved immune function, and reduced stress. Seek guidance from your physician and exercise physiologist before implementing an exercise routine and learn your capabilities and proper form for all exercise.  Medical   Aging: Enhancing Your Quality of Life  Clinical staff conducted group or individual video education with verbal and written material and guidebook.  Patient learns key strategies and recommendations to stay in good physical health and enhance quality of life, such as prevention strategies,  having an advocate, securing a Health Care Proxy and Power of Attorney, and keeping a list of medications and system for tracking them. It also discusses how to avoid risk for bone loss.  Biology of Weight Control  Clinical staff conducted group or individual video education with verbal and written material and guidebook.  Patient learns that weight gain occurs because we consume more calories than we burn (eating more, moving less). Even if your body weight is normal, you may have higher ratios of fat compared to muscle mass. Too much body fat puts you at increased risk for cardiovascular disease, heart attack, stroke, type 2 diabetes, and obesity-related cancers. In addition to exercise, following the Pritikin Eating Plan can help reduce your risk.  Decoding Lab Results  Clinical staff conducted group or individual video education with verbal and written material and guidebook.  Patient learns that lab test reflects one measurement whose values change over time and are influenced by many factors, including medication, stress, sleep, exercise, food, hydration, pre-existing medical conditions, and more. It is recommended to use the knowledge from this video to become more involved with your lab results and evaluate your numbers to speak with your  doctor.   Diseases of Our Time - Overview  Clinical staff conducted group or individual video education with verbal and written material and guidebook.  Patient learns that according to the CDC, 50% to 70% of chronic diseases (such as obesity, type 2 diabetes, elevated lipids, hypertension, and heart disease) are avoidable through lifestyle improvements including healthier food choices, listening to satiety cues, and increased physical activity.  Sleep Disorders Clinical staff conducted group or individual video education with verbal and written material and guidebook.  Patient learns how good quality and duration of sleep are important to overall health and well-being. Patient also learns about sleep disorders and how they impact health along with recommendations to address them, including discussing with a physician.  Nutrition  Dining Out - Part 2 Clinical staff conducted group or individual video education with verbal and written material and guidebook.  Patient learns how to plan ahead and communicate in order to maximize their dining experience in a healthy and nutritious manner. Included are recommended food choices based on the type of restaurant the patient is visiting.   Fueling a Banker conducted group or individual video education with verbal and written material and guidebook.  There is a strong connection between our food choices and our health. Diseases like obesity and type 2 diabetes are very prevalent and are in large-part due to lifestyle choices. The Pritikin Eating Plan provides plenty of food and hunger-curbing satisfaction. It is easy to follow, affordable, and helps reduce health risks.  Menu Workshop  Clinical staff conducted group or individual video education with verbal and written material and guidebook.  Patient learns that restaurant meals can sabotage health goals because they are often packed with calories, fat, sodium, and sugar.  Recommendations include strategies to plan ahead and to communicate with the manager, chef, or server to help order a healthier meal.  Planning Your Eating Strategy  Clinical staff conducted group or individual video education with verbal and written material and guidebook.  Patient learns about the Pritikin Eating Plan and its benefit of reducing the risk of disease. The Pritikin Eating Plan does not focus on calories. Instead, it emphasizes high-quality, nutrient-rich foods. By knowing the characteristics of the foods, we choose, we can determine their calorie density and make informed decisions.  Targeting Your Nutrition Priorities  Clinical staff conducted group or individual video education with verbal and written material and guidebook.  Patient learns that lifestyle habits have a tremendous impact on disease risk and progression. This video provides eating and physical activity recommendations based on your personal health goals, such as reducing LDL cholesterol, losing weight, preventing or controlling type 2 diabetes, and reducing high blood pressure.  Vitamins and Minerals  Clinical staff conducted group or individual video education with verbal and written material and guidebook.  Patient learns different ways to obtain key vitamins and minerals, including through a recommended healthy diet. It is important to discuss all supplements you take with your doctor.   Healthy Mind-Set    Smoking Cessation  Clinical staff conducted group or individual video education with verbal and written material and guidebook.  Patient learns that cigarette smoking and tobacco addiction pose a serious health risk which affects millions of people. Stopping smoking will significantly reduce the risk of heart disease, lung disease, and many forms of cancer. Recommended strategies for quitting are covered, including working with your doctor to develop a successful plan.  Culinary   Becoming a Corporate investment banker conducted group or individual video education with verbal and written material and guidebook.  Patient learns that cooking at home can be healthy, cost-effective, quick, and puts them in control. Keys to cooking healthy recipes will include looking at your recipe, assessing your equipment needs, planning ahead, making it simple, choosing cost-effective seasonal ingredients, and limiting the use of added fats, salts, and sugars.  Cooking - Breakfast and Snacks  Clinical staff conducted group or individual video education with verbal and written material and guidebook.  Patient learns how important breakfast is to satiety and nutrition through the entire day. Recommendations include key foods to eat during breakfast to help stabilize blood sugar levels and to prevent overeating at meals later in the day. Planning ahead is also a key component.  Cooking - Educational psychologist conducted group or individual video education with verbal and written material and guidebook.  Patient learns eating strategies to improve overall health, including an approach to cook more at home. Recommendations include thinking of animal protein as a side on your plate rather than center stage and focusing instead on lower calorie dense options like vegetables, fruits, whole grains, and plant-based proteins, such as beans. Making sauces in large quantities to freeze for later and leaving the skin on your vegetables are also recommended to maximize your experience.  Cooking - Healthy Salads and Dressing Clinical staff conducted group or individual video education with verbal and written material and guidebook.  Patient learns that vegetables, fruits, whole grains, and legumes are the foundations of the Pritikin Eating Plan. Recommendations include how to incorporate each of these in flavorful and healthy salads, and how to create homemade salad dressings. Proper handling of ingredients is also covered.  Cooking - Soups and [employer] - Soups and Desserts Clinical staff conducted group or individual video education with verbal and written material and guidebook.  Patient learns that Pritikin soups and desserts make for easy, nutritious, and delicious snacks and meal components that are low in sodium, fat, sugar, and calorie density, while high in vitamins, minerals, and filling fiber. Recommendations include simple and healthy ideas for soups and desserts.   Overview     The Pritikin Solution Program Overview Clinical staff conducted group or individual video education with verbal and written material and  guidebook.  Patient learns that the results of the Pritikin Program have been documented in more than 100 articles published in peer-reviewed journals, and the benefits include reducing risk factors for (and, in some cases, even reversing) high cholesterol, high blood pressure, type 2 diabetes, obesity, and more! An overview of the three key pillars of the Pritikin Program will be covered: eating well, doing regular exercise, and having a healthy mind-set.  WORKSHOPS  Exercise: Exercise Basics: Building Your Action Plan Clinical staff led group instruction and group discussion with PowerPoint presentation and patient guidebook. To enhance the learning environment the use of posters, models and videos may be added. At the conclusion of this workshop, patients will comprehend the difference between physical activity and exercise, as well as the benefits of incorporating both, into their routine. Patients will understand the FITT (Frequency, Intensity, Time, and Type) principle and how to use it to build an exercise action plan. In addition, safety concerns and other considerations for exercise and cardiac rehab will be addressed by the presenter. The purpose of this lesson is to promote a comprehensive and effective weekly exercise routine in order to improve patients' overall level of  fitness.   Managing Heart Disease: Your Path to a Healthier Heart Clinical staff led group instruction and group discussion with PowerPoint presentation and patient guidebook. To enhance the learning environment the use of posters, models and videos may be added.At the conclusion of this workshop, patients will understand the anatomy and physiology of the heart. Additionally, they will understand how Pritikin's three pillars impact the risk factors, the progression, and the management of heart disease.  The purpose of this lesson is to provide a high-level overview of the heart, heart disease, and how the Pritikin lifestyle positively impacts risk factors.  Exercise Biomechanics Clinical staff led group instruction and group discussion with PowerPoint presentation and patient guidebook. To enhance the learning environment the use of posters, models and videos may be added. Patients will learn how the structural parts of their bodies function and how these functions impact their daily activities, movement, and exercise. Patients will learn how to promote a neutral spine, learn how to manage pain, and identify ways to improve their physical movement in order to promote healthy living. The purpose of this lesson is to expose patients to common physical limitations that impact physical activity. Participants will learn practical ways to adapt and manage aches and pains, and to minimize their effect on regular exercise. Patients will learn how to maintain good posture while sitting, walking, and lifting.  Balance Training and Fall Prevention  Clinical staff led group instruction and group discussion with PowerPoint presentation and patient guidebook. To enhance the learning environment the use of posters, models and videos may be added. At the conclusion of this workshop, patients will understand the importance of their sensorimotor skills (vision, proprioception, and the vestibular system)  in maintaining their ability to balance as they age. Patients will apply a variety of balancing exercises that are appropriate for their current level of function. Patients will understand the common causes for poor balance, possible solutions to these problems, and ways to modify their physical environment in order to minimize their fall risk. The purpose of this lesson is to teach patients about the importance of maintaining balance as they age and ways to minimize their risk of falling.  WORKSHOPS   Nutrition:  Fueling a Ship broker led group instruction and group discussion with PowerPoint presentation and patient guidebook. To enhance  the learning environment the use of posters, models and videos may be added. Patients will review the foundational principles of the Pritikin Eating Plan and understand what constitutes a serving size in each of the food groups. Patients will also learn Pritikin-friendly foods that are better choices when away from home and review make-ahead meal and snack options. Calorie density will be reviewed and applied to three nutrition priorities: weight maintenance, weight loss, and weight gain. The purpose of this lesson is to reinforce (in a group setting) the key concepts around what patients are recommended to eat and how to apply these guidelines when away from home by planning and selecting Pritikin-friendly options. Patients will understand how calorie density may be adjusted for different weight management goals.  Mindful Eating  Clinical staff led group instruction and group discussion with PowerPoint presentation and patient guidebook. To enhance the learning environment the use of posters, models and videos may be added. Patients will briefly review the concepts of the Pritikin Eating Plan and the importance of low-calorie dense foods. The concept of mindful eating will be introduced as well as the importance of paying attention to internal hunger  signals. Triggers for non-hunger eating and techniques for dealing with triggers will be explored. The purpose of this lesson is to provide patients with the opportunity to review the basic principles of the Pritikin Eating Plan, discuss the value of eating mindfully and how to measure internal cues of hunger and fullness using the Hunger Scale. Patients will also discuss reasons for non-hunger eating and learn strategies to use for controlling emotional eating.  Targeting Your Nutrition Priorities Clinical staff led group instruction and group discussion with PowerPoint presentation and patient guidebook. To enhance the learning environment the use of posters, models and videos may be added. Patients will learn how to determine their genetic susceptibility to disease by reviewing their family history. Patients will gain insight into the importance of diet as part of an overall healthy lifestyle in mitigating the impact of genetics and other environmental insults. The purpose of this lesson is to provide patients with the opportunity to assess their personal nutrition priorities by looking at their family history, their own health history and current risk factors. Patients will also be able to discuss ways of prioritizing and modifying the Pritikin Eating Plan for their highest risk areas  Menu  Clinical staff led group instruction and group discussion with PowerPoint presentation and patient guidebook. To enhance the learning environment the use of posters, models and videos may be added. Using menus brought in from [employer], or printed from [employer], patients will apply the Pritikin dining out guidelines that were presented in the [employer] video. Patients will also be able to practice these guidelines in a variety of provided scenarios. The purpose of this lesson is to provide patients with the opportunity to practice hands-on learning of the Pritikin Dining Out guidelines  with actual menus and practice scenarios.  Label Reading Clinical staff led group instruction and group discussion with PowerPoint presentation and patient guidebook. To enhance the learning environment the use of posters, models and videos may be added. Patients will review and discuss the Pritikin label reading guidelines presented in Pritikin's Label Reading Educational series video. Using fool labels brought in from local grocery stores and markets, patients will apply the label reading guidelines and determine if the packaged food meet the Pritikin guidelines. The purpose of this lesson is to provide patients with the opportunity to review, discuss,  and practice hands-on learning of the Pritikin Label Reading guidelines with actual packaged food labels. Cooking School  Pritikin's [employer] are designed to teach patients ways to prepare quick, simple, and affordable recipes at home. The importance of nutrition's role in chronic disease risk reduction is reflected in its emphasis in the overall Pritikin program. By learning how to prepare essential core Pritikin Eating Plan recipes, patients will increase control over what they eat; be able to customize the flavor of foods without the use of added salt, sugar, or fat; and improve the quality of the food they consume. By learning a set of core recipes which are easily assembled, quickly prepared, and affordable, patients are more likely to prepare more healthy foods at home. These workshops focus on convenient breakfasts, simple entres, side dishes, and desserts which can be prepared with minimal effort and are consistent with nutrition recommendations for cardiovascular risk reduction. Cooking [employer] are taught by a Armed forces logistics/support/administrative officer (RD) who has been trained by the [employer]. The chef or RD has a clear understanding of the importance of minimizing - if not completely eliminating - added fat, sugar, and  sodium in recipes. Throughout the series of Cooking School Workshop sessions, patients will learn about healthy ingredients and efficient methods of cooking to build confidence in their capability to prepare    [employer] weekly topics:  Adding Flavor- Sodium-Free  Fast and Healthy Breakfasts  Powerhouse Plant-Based Proteins  Satisfying Salads and Dressings  Simple Sides and Sauces  International Cuisine-Spotlight on the [employer] Zones  Delicious Desserts  Savory Soups  [employer] - Meals in a Astronomer Appetizers and Snacks  Comforting Weekend Breakfasts  One-Pot Wonders   Fast Evening Meals  Landscape architect Your Pritikin Plate  WORKSHOPS   Healthy Mindset (Psychosocial):  Focused Goals, Sustainable Changes Clinical staff led group instruction and group discussion with PowerPoint presentation and patient guidebook. To enhance the learning environment the use of posters, models and videos may be added. Patients will be able to apply effective goal setting strategies to establish at least one personal goal, and then take consistent, meaningful action toward that goal. They will learn to identify common barriers to achieving personal goals and develop strategies to overcome them. Patients will also gain an understanding of how our mind-set can impact our ability to achieve goals and the importance of cultivating a positive and growth-oriented mind-set. The purpose of this lesson is to provide patients with a deeper understanding of how to set and achieve personal goals, as well as the tools and strategies needed to overcome common obstacles which may arise along the way.  From Head to Heart: The Power of a Healthy Outlook  Clinical staff led group instruction and group discussion with PowerPoint presentation and patient guidebook. To enhance the learning environment the use of posters, models and videos may be added. Patients will be able to recognize and  describe the impact of emotions and mood on physical health. They will discover the importance of self-care and explore self-care practices which may work for them. Patients will also learn how to utilize the 4 C's to cultivate a healthier outlook and better manage stress and challenges. The purpose of this lesson is to demonstrate to patients how a healthy outlook is an essential part of maintaining good health, especially as they continue their cardiac rehab journey.  Healthy Sleep for a Healthy Heart Clinical staff led group instruction and group discussion  with PowerPoint presentation and patient guidebook. To enhance the learning environment the use of posters, models and videos may be added. At the conclusion of this workshop, patients will be able to demonstrate knowledge of the importance of sleep to overall health, well-being, and quality of life. They will understand the symptoms of, and treatments for, common sleep disorders. Patients will also be able to identify daytime and nighttime behaviors which impact sleep, and they will be able to apply these tools to help manage sleep-related challenges. The purpose of this lesson is to provide patients with a general overview of sleep and outline the importance of quality sleep. Patients will learn about a few of the most common sleep disorders. Patients will also be introduced to the concept of "sleep hygiene," and discover ways to self-manage certain sleeping problems through simple daily behavior changes. Finally, the workshop will motivate patients by clarifying the links between quality sleep and their goals of heart-healthy living.   Recognizing and Reducing Stress Clinical staff led group instruction and group discussion with PowerPoint presentation and patient guidebook. To enhance the learning environment the use of posters, models and videos may be added. At the conclusion of this workshop, patients will be able to understand the types of stress  reactions, differentiate between acute and chronic stress, and recognize the impact that chronic stress has on their health. They will also be able to apply different coping mechanisms, such as reframing negative self-talk. Patients will have the opportunity to practice a variety of stress management techniques, such as deep abdominal breathing, progressive muscle relaxation, and/or guided imagery.  The purpose of this lesson is to educate patients on the role of stress in their lives and to provide healthy techniques for coping with it.  Learning Barriers/Preferences:  Learning Barriers/Preferences - 03/01/23 1416       Learning Barriers/Preferences   Learning Barriers None    Learning Preferences Group Instruction;Individual Instruction;Pictoral;Video;Written Material;Skilled Demonstration;Computer/Internet             Education Topics:  Knowledge Questionnaire Score:  Knowledge Questionnaire Score - 03/01/23 1416       Knowledge Questionnaire Score   Pre Score 19/24             Core Components/Risk Factors/Patient Goals at Admission:  Personal Goals and Risk Factors at Admission - 03/01/23 1422       Core Components/Risk Factors/Patient Goals on Admission    Weight Management Yes;Obesity;Weight Loss    Intervention Weight Management: Develop a combined nutrition and exercise program designed to reach desired caloric intake, while maintaining appropriate intake of nutrient and fiber, sodium and fats, and appropriate energy expenditure required for the weight goal.;Weight Management: Provide education and appropriate resources to help participant work on and attain dietary goals.;Weight Management/Obesity: Establish reasonable short term and long term weight goals.;Obesity: Provide education and appropriate resources to help participant work on and attain dietary goals.    Admit Weight 220 lb 14.4 oz (100.2 kg)    Expected Outcomes Short Term: Continue to assess and modify  interventions until short term weight is achieved;Long Term: Adherence to nutrition and physical activity/exercise program aimed toward attainment of established weight goal;Weight Loss: Understanding of general recommendations for a balanced deficit meal plan, which promotes 1-2 lb weight loss per week and includes a negative energy balance of [phone] kcal/d;Understanding recommendations for meals to include 15-35% energy as protein, 25-35% energy from fat, 35-60% energy from carbohydrates, less than 200mg  of dietary cholesterol, 20-35 gm of total fiber daily;Understanding  of distribution of calorie intake throughout the day with the consumption of 4-5 meals/snacks    Hypertension Yes    Intervention Provide education on lifestyle modifcations including regular physical activity/exercise, weight management, moderate sodium restriction and increased consumption of fresh fruit, vegetables, and low fat dairy, alcohol moderation, and smoking cessation.;Monitor prescription use compliance.    Expected Outcomes Short Term: Continued assessment and intervention until BP is < 140/70mm HG in hypertensive participants. < 130/74mm HG in hypertensive participants with diabetes, heart failure or chronic kidney disease.;Long Term: Maintenance of blood pressure at goal levels.    Lipids Yes    Intervention Provide education and support for participant on nutrition & aerobic/resistive exercise along with prescribed medications to achieve LDL 70mg , HDL >40mg .    Expected Outcomes Short Term: Participant states understanding of desired cholesterol values and is compliant with medications prescribed. Participant is following exercise prescription and nutrition guidelines.;Long Term: Cholesterol controlled with medications as prescribed, with individualized exercise RX and with personalized nutrition plan. Value goals: LDL < 70mg , HDL > 40 mg.    Stress Yes    Intervention Offer individual and/or small group education and  counseling on adjustment to heart disease, stress management and health-related lifestyle change. Teach and support self-help strategies.;Refer participants experiencing significant psychosocial distress to appropriate mental health specialists for further evaluation and treatment. When possible, include family members and significant others in education/counseling sessions.    Expected Outcomes Short Term: Participant demonstrates changes in health-related behavior, relaxation and other stress management skills, ability to obtain effective social support, and compliance with psychotropic medications if prescribed.;Long Term: Emotional wellbeing is indicated by absence of clinically significant psychosocial distress or social isolation.    Personal Goal Other Yes    Personal Goal Pt would like to get back to normal, and have long life, know more about sternal precautions    Intervention Will continue to monitor pt and progress workloads as tolerated without sign or symptom    Expected Outcomes Pt will achieve his goals             Core Components/Risk Factors/Patient Goals Review:   Goals and Risk Factor Review     Row Name 03/15/23 0959             Core Components/Risk Factors/Patient Goals Review   Personal Goals Review Weight Management/Obesity;Hypertension;Lipids;Stress       Review Delando started cardiac rehab on 03/05/23 and is off to a good start to exercise. Vital signs have been stable. HDL 19, other Lipid values WNL       Expected Outcomes Coery will continue to participate in cardiac rehab for exercise, nutrition and lifestyle modifications                Core Components/Risk Factors/Patient Goals at Discharge (Final Review):   Goals and Risk Factor Review - 03/15/23 0959       Core Components/Risk Factors/Patient Goals Review   Personal Goals Review Weight Management/Obesity;Hypertension;Lipids;Stress    Review Rawley started cardiac rehab on 03/05/23 and is off to a  good start to exercise. Vital signs have been stable. HDL 19, other Lipid values WNL    Expected Outcomes Coery will continue to participate in cardiac rehab for exercise, nutrition and lifestyle modifications             ITP Comments:  ITP Comments     Row Name 03/01/23 1100 03/15/23 0940         ITP Comments Dr. Armanda Magic medical director. Introduction  to [facility] education / intensive cardiac rehab. Initial orientation packet reviewed with patient. 30 Day ITP Review. Nadia started cardiac rehab on 03/05/23 and is off to a good start to exercise.               Comments: See ITP comments,Neamiah Sciarra Mila Palmer RN BSN

## 2023-03-16 ENCOUNTER — Ambulatory Visit (HOSPITAL_COMMUNITY): Payer: No Typology Code available for payment source

## 2023-03-16 ENCOUNTER — Encounter (HOSPITAL_COMMUNITY)
Admission: RE | Admit: 2023-03-16 | Discharge: 2023-03-16 | Disposition: A | Discharge status: Home / Self Care | Payer: No Typology Code available for payment source | Source: Ambulatory Visit | Attending: Cardiology

## 2023-03-16 DIAGNOSIS — Z951 Presence of aortocoronary bypass graft: Secondary | ICD-10-CM

## 2023-03-19 ENCOUNTER — Encounter (HOSPITAL_COMMUNITY)
Admission: RE | Admit: 2023-03-19 | Discharge: 2023-03-19 | Disposition: A | Discharge status: Home / Self Care | Payer: No Typology Code available for payment source | Source: Ambulatory Visit | Attending: Cardiology

## 2023-03-19 ENCOUNTER — Ambulatory Visit (HOSPITAL_COMMUNITY): Payer: No Typology Code available for payment source

## 2023-03-19 DIAGNOSIS — Z951 Presence of aortocoronary bypass graft: Secondary | ICD-10-CM | POA: Diagnosis not present

## 2023-03-19 NOTE — Progress Notes (Signed)
Daily Session Note  Patient Details  Name: Christopher [name] MRN: [id number] Date of Birth: [birth year] Referring Provider:   Flowsheet Row INTENSIVE CARDIAC REHAB ORIENT from 03/01/2023 in [facility]  Referring Provider Dr. Carleene Cooper, MD (Dr. Armanda Magic covering)       Encounter Date: 03/19/2023  Check In:  Session Check In - 03/19/23 0705       Check-In   Supervising physician immediately available to respond to emergencies [facility] MD immediately available    Physician(s) Edd Fabian, NP    Location [facility]    Staff Present Hughie Closs BS, ACSM-CEP, Exercise Physiologist;Olinty Peggye Pitt, MS, ACSM-CEP, Exercise Physiologist;Johnny Hale Bogus, MS, Exercise Physiologist;Lakedra Washington Remus Loffler, RN, [place]    Virtual Visit No    Medication changes reported     Yes    Comments Started Chlorzoxazone 500 mg TID as needed    Fall or balance concerns reported    No    Tobacco Cessation No Change    Warm-up and Cool-down Performed as group-led instruction    Resistance Training Performed Yes    VAD Patient? No    PAD/SET Patient? No      Pain Assessment   Currently in Pain? No/denies    Multiple Pain Sites No             Capillary Blood Glucose: No results found for this or any previous visit (from the past 24 hour(s)).    Social History   Tobacco Use  Smoking Status Never  Smokeless Tobacco Never    Goals Met:  Independence with exercise equipment Exercise tolerated well No report of concerns or symptoms today Strength training completed today  Goals Unmet:  Not Applicable  Comments: QUALITY OF LIFE SCORE REVIEW Pt completed Quality of Life survey as a participant in Cardiac Rehab.  Scores 19.0 or below are considered low.  Overall 18.52, Health and Function 13.93, socioeconomic 26.67, physiological and spiritual 20.00, family 20.40. Patient quality of life slightly altered by physical  constraints which limits ability to perform as prior to recent cardiac illness. Offered emotional support and reassurance.  Will continue to monitor and intervene as necessary.

## 2023-03-19 NOTE — Progress Notes (Signed)
CARDIAC REHAB PHASE 2  Reviewed home exercise with pt today. Pt is tolerating exercise well. Pt will continue to exercise on his own by walking, stretching and bike rides for 30-45 minutes per session 2-3 days a week in addition to the 3 days in CRP2. Advised pt on THRR, RPE scale, hydration and temperature/humidity precautions. Reinforced NTG use, S/S to stop exercise and when to call MD vs 911. Encouraged warm up cool down and stretches with exercise sessions. Pt verbalized understanding, all questions were answered and pt was given a copy to take home.    Harrie Jeans ACSM-CEP 03/19/2023 8:47 AM

## 2023-03-21 ENCOUNTER — Encounter (HOSPITAL_COMMUNITY)
Admission: RE | Admit: 2023-03-21 | Discharge: 2023-03-21 | Disposition: A | Discharge status: Home / Self Care | Payer: No Typology Code available for payment source | Source: Ambulatory Visit | Attending: Cardiology | Admitting: Cardiology

## 2023-03-21 ENCOUNTER — Ambulatory Visit (HOSPITAL_COMMUNITY): Payer: No Typology Code available for payment source

## 2023-03-21 DIAGNOSIS — Z951 Presence of aortocoronary bypass graft: Secondary | ICD-10-CM | POA: Diagnosis not present

## 2023-03-23 ENCOUNTER — Ambulatory Visit (HOSPITAL_COMMUNITY): Payer: No Typology Code available for payment source

## 2023-03-23 ENCOUNTER — Encounter (HOSPITAL_COMMUNITY)
Admission: RE | Admit: 2023-03-23 | Discharge: 2023-03-23 | Disposition: A | Discharge status: Home / Self Care | Payer: No Typology Code available for payment source | Source: Ambulatory Visit | Attending: Cardiology | Admitting: Cardiology

## 2023-03-23 DIAGNOSIS — Z951 Presence of aortocoronary bypass graft: Secondary | ICD-10-CM | POA: Diagnosis not present

## 2023-03-26 ENCOUNTER — Ambulatory Visit (HOSPITAL_COMMUNITY): Payer: No Typology Code available for payment source

## 2023-03-26 ENCOUNTER — Encounter (HOSPITAL_COMMUNITY): Payer: No Typology Code available for payment source

## 2023-03-28 ENCOUNTER — Encounter (HOSPITAL_COMMUNITY)
Admission: RE | Admit: 2023-03-28 | Discharge: 2023-03-28 | Disposition: A | Discharge status: Home / Self Care | Payer: No Typology Code available for payment source | Source: Ambulatory Visit | Attending: Cardiology | Admitting: Cardiology

## 2023-03-28 ENCOUNTER — Ambulatory Visit (HOSPITAL_COMMUNITY): Payer: No Typology Code available for payment source

## 2023-03-28 DIAGNOSIS — Z951 Presence of aortocoronary bypass graft: Secondary | ICD-10-CM | POA: Diagnosis not present

## 2023-03-30 ENCOUNTER — Encounter (HOSPITAL_COMMUNITY)
Admission: RE | Admit: 2023-03-30 | Discharge: 2023-03-30 | Disposition: A | Discharge status: Home / Self Care | Payer: No Typology Code available for payment source | Source: Ambulatory Visit | Attending: Cardiology | Admitting: Cardiology

## 2023-03-30 ENCOUNTER — Ambulatory Visit (HOSPITAL_COMMUNITY): Payer: No Typology Code available for payment source

## 2023-03-30 DIAGNOSIS — Z951 Presence of aortocoronary bypass graft: Secondary | ICD-10-CM | POA: Diagnosis present

## 2023-04-02 ENCOUNTER — Encounter (HOSPITAL_COMMUNITY)
Admission: RE | Admit: 2023-04-02 | Discharge: 2023-04-02 | Disposition: A | Discharge status: Home / Self Care | Payer: No Typology Code available for payment source | Source: Ambulatory Visit | Attending: Cardiology

## 2023-04-02 ENCOUNTER — Ambulatory Visit (HOSPITAL_COMMUNITY): Payer: No Typology Code available for payment source

## 2023-04-02 DIAGNOSIS — Z951 Presence of aortocoronary bypass graft: Secondary | ICD-10-CM | POA: Diagnosis not present

## 2023-04-04 ENCOUNTER — Ambulatory Visit (HOSPITAL_COMMUNITY): Payer: No Typology Code available for payment source

## 2023-04-04 ENCOUNTER — Encounter (HOSPITAL_COMMUNITY)
Admission: RE | Admit: 2023-04-04 | Discharge: 2023-04-04 | Disposition: A | Discharge status: Home / Self Care | Payer: No Typology Code available for payment source | Source: Ambulatory Visit | Attending: Cardiology

## 2023-04-04 DIAGNOSIS — Z951 Presence of aortocoronary bypass graft: Secondary | ICD-10-CM

## 2023-04-06 ENCOUNTER — Ambulatory Visit (HOSPITAL_COMMUNITY): Payer: No Typology Code available for payment source

## 2023-04-06 ENCOUNTER — Encounter (HOSPITAL_COMMUNITY): Payer: No Typology Code available for payment source

## 2023-04-09 ENCOUNTER — Ambulatory Visit (HOSPITAL_COMMUNITY): Payer: No Typology Code available for payment source

## 2023-04-09 ENCOUNTER — Encounter (HOSPITAL_COMMUNITY): Payer: No Typology Code available for payment source

## 2023-04-11 ENCOUNTER — Ambulatory Visit (HOSPITAL_COMMUNITY): Payer: No Typology Code available for payment source

## 2023-04-11 ENCOUNTER — Encounter (HOSPITAL_COMMUNITY)
Admission: RE | Admit: 2023-04-11 | Discharge: 2023-04-11 | Disposition: A | Discharge status: Home / Self Care | Payer: No Typology Code available for payment source | Source: Ambulatory Visit | Attending: Cardiology | Admitting: Cardiology

## 2023-04-11 DIAGNOSIS — Z951 Presence of aortocoronary bypass graft: Secondary | ICD-10-CM

## 2023-04-13 ENCOUNTER — Encounter (HOSPITAL_COMMUNITY): Payer: No Typology Code available for payment source

## 2023-04-13 ENCOUNTER — Ambulatory Visit (HOSPITAL_COMMUNITY): Payer: No Typology Code available for payment source

## 2023-04-16 ENCOUNTER — Ambulatory Visit (HOSPITAL_COMMUNITY): Payer: No Typology Code available for payment source

## 2023-04-16 ENCOUNTER — Encounter (HOSPITAL_COMMUNITY)
Admission: RE | Admit: 2023-04-16 | Discharge: 2023-04-16 | Disposition: A | Discharge status: Home / Self Care | Payer: No Typology Code available for payment source | Source: Ambulatory Visit | Attending: Cardiology

## 2023-04-16 DIAGNOSIS — Z951 Presence of aortocoronary bypass graft: Secondary | ICD-10-CM

## 2023-04-16 NOTE — Progress Notes (Signed)
Cardiac Individual Treatment Plan  Patient Details  Name: Christopher [name] MRN: [id number] Date of Birth: Oct 08, 1965 Referring Provider:   Flowsheet Row INTENSIVE CARDIAC REHAB ORIENT from 03/01/2023 in [facility] for Heart, Vascular, & Lung Health  Referring Provider Dr. Carleene Cooper, MD (Dr. 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  Patient's Home Medications on Admission:  Current Outpatient Medications:    acetaminophen (TYLENOL) 500 MG tablet, Take 2 tablets (1,000 mg total) by mouth 4 (four) times daily., Disp: 100 tablet, Rfl: 0   amLODipine (NORVASC) 2.5 MG tablet, Take 1 tablet (2.5 mg total) by mouth daily., Disp: 30 tablet, Rfl: 1   cetirizine (ZYRTEC) 10 MG tablet, Take 10 mg by mouth daily., Disp: , Rfl:    chlorzoxazone (PARAFON) 500 MG tablet, Take 500 mg by mouth 3 (three) times daily as needed for muscle spasms., Disp: , Rfl:    clobetasol ointment (TEMOVATE) 0.05 %, Apply 1 Application topically 2 (two) times daily as needed (dermatitis flare)., Disp: , Rfl:    furosemide (LASIX) 40 MG tablet, Take 1 tablet (40 mg total) by mouth daily. (Patient not taking: Reported on 03/01/2023), Disp: 5 tablet, Rfl: 0   latanoprost (XALATAN) 0.005 % ophthalmic solution, Place 1 drop into both eyes at bedtime., Disp: , Rfl:    metoprolol succinate (TOPROL-XL) 50 MG 24 hr tablet, Take 25 mg by mouth daily., Disp: , Rfl:    nitroGLYCERIN (NITROSTAT) 0.3 MG SL tablet, Place 1 tablet (0.3 mg total) under the tongue every 5 (five) minutes as needed for chest pain., Disp: 90 tablet, Rfl: 0   Polyvinyl Alcohol-Povidone (REFRESH OP), Place 1 drop into both eyes daily., Disp: , Rfl:    rosuvastatin (CRESTOR) 40 MG tablet, Take 1 tablet (40 mg total) by mouth daily.,  Disp: 30 tablet, Rfl: 2   Simethicone (GAS-X PO), Take 2 tablets by mouth daily as needed (gas)., Disp: , Rfl:    triamcinolone ointment (KENALOG) 0.1 %, Apply 1 Application topically 2 (two) times daily as needed (dermatitis flare)., Disp: , Rfl:   Past Medical History: Past Medical History:  Diagnosis Date   Allergy    Anginal pain (HCC)    with exertion   Back pain    Coronary artery disease    Fatty liver    GERD (gastroesophageal reflux disease)    Glaucoma    Hypertension    MRSA carrier    Sleep apnea    On CPAP    Tobacco Use: Social History   Tobacco Use  Smoking Status Never  Smokeless Tobacco Never    Labs: Review Flowsheet  More data exists      Latest Ref Rng & Units 09/10/2017 10/31/2017 01/09/2023 01/17/2023 01/18/2023  Labs for ITP Cardiac and Pulmonary Rehab  Cholestrol 0 - 200 mg/dL 409  811  - - 61   LDL (calc) 0 - 99 mg/dL 914  782  - - 26   HDL-C >40 mg/dL 31  38  - - 19   Trlycerides <150 mg/dL 956  213  - - 79   Hemoglobin A1c 4.8 - 5.6 % - 5.5  5.7  - -  PH, Arterial 7.35 - 7.45 - - 7.42  7.301  7.234  7.281  7.275  7.323  7.314  7.317  -  PCO2 arterial 32 - 48 mmHg - - 36  45.9  28.6  42.5  39.6  46.0  38.4  44.4  -  Bicarbonate 20.0 - 28.0 mmol/L - - 23.4  22.4  11.9  20.0  18.4  23.9  19.5  22.7  -  TCO2 22 - 32 mmol/L - - - 24  13  21  20  23  25  24  21  21  22  22  24   -  Acid-base deficit 0.0 - 2.0 mmol/L - - 0.7  4.0  14.0  6.0  8.0  2.0  6.0  4.0  -  O2 Saturation % - - 100  96  97  94  96  99  100  99  -    Details       Multiple values from one day are sorted in reverse-chronological order         Capillary Blood Glucose: Lab Results  Component Value Date   GLUCAP 97 01/23/2023   GLUCAP 113 (H) 01/22/2023   GLUCAP 82 01/22/2023   GLUCAP 86 01/22/2023   GLUCAP 98 01/22/2023     Exercise Target Goals: Exercise Program Goal: Individual exercise prescription set using results from initial 6 min walk test and THRR while  considering  patient's activity barriers and safety.   Exercise Prescription Goal: Initial exercise prescription builds to 30-45 minutes a day of aerobic activity, 2-3 days per week.  Home exercise guidelines will be given to patient during program as part of exercise prescription that the participant will acknowledge.  Activity Barriers & Risk Stratification:  Activity Barriers & Cardiac Risk Stratification - 03/01/23 1401       Activity Barriers & Cardiac Risk Stratification   Activity Barriers Deconditioning;Back Problems;Neck/Spine Problems    Cardiac Risk Stratification High   Under 5 MET's on            6 Minute Walk:  6 Minute Walk     Row Name 03/01/23 1359         6 Minute Walk   Phase Initial     Distance 1265 feet     Walk Time 6 minutes     # of Rest Breaks 0     MPH 2.4     METS 3.16     RPE 7     Perceived Dyspnea  0     VO2 Peak 11.06     Symptoms Yes (comment)     Comments incision site pain 2/10, right side of scar, resolved with rest     Resting HR 75 bpm     Resting BP 140/72     Resting Oxygen Saturation  99 %     Exercise Oxygen Saturation  during 6 min walk 100 %     Max Ex. HR 92 bpm     Max Ex. BP 150/84     2 Minute Post BP 148/82              Oxygen Initial Assessment:   Oxygen Re-Evaluation:   Oxygen Discharge (Final Oxygen Re-Evaluation):   Initial Exercise Prescription:  Initial Exercise Prescription - 03/01/23 1400       Date of Initial Exercise RX and Referring Provider   Date 03/01/23    Referring Provider Dr. Carleene Cooper, MD (Dr. Armanda Magic covering)    Expected Discharge Date 05/21/23      Recumbant Bike  Level 2    RPM 60    Watts 15    Minutes 15    METs 1.9      NuStep   Level 2    SPM 80    Minutes 15    METs 2      Prescription Details   Frequency (times per week) 3    Duration Progress to 30 minutes of continuous aerobic without signs/symptoms of physical distress      Intensity    THRR 40-80% of Max Heartrate 66-132    Ratings of Perceived Exertion 11-13    Perceived Dyspnea 0-4      Progression   Progression Continue progressive overload as per policy without signs/symptoms or physical distress.      Resistance Training   Training Prescription Yes    Weight 3    Reps 10-15             Perform Capillary Blood Glucose checks as needed.  Exercise Prescription Changes:   Exercise Prescription Changes     Row Name 03/05/23 0837 03/19/23 0840 04/02/23 1715 04/16/23 1416       Response to Exercise   Blood Pressure (Admit) 140/82 134/72 122/64 130/70    Blood Pressure (Exercise) 140/88 136/82 --  Over 10 exercise sessions, no BP 128/80  Over 10 exercise sessions, no BP    Blood Pressure (Exit) 110/60 128/80 124/82 108/64    Heart Rate (Admit) 78 bpm 78 bpm 70 bpm 74 bpm    Heart Rate (Exercise) 92 bpm 98 bpm 94 bpm 97 bpm    Heart Rate (Exit) 73 bpm 82 bpm 73 bpm 70 bpm    Rating of Perceived Exertion (Exercise) 10 11 12  12.5    Perceived Dyspnea (Exercise) 0 0 0 0    Symptoms 0 0 0 0    Comments Pt first day in the [employer] program Reviewed MET's, goals and home ExRx REVD MET's REVD MET's and goals    Duration Progress to 30 minutes of  aerobic without signs/symptoms of physical distress Progress to 30 minutes of  aerobic without signs/symptoms of physical distress Progress to 30 minutes of  aerobic without signs/symptoms of physical distress Progress to 30 minutes of  aerobic without signs/symptoms of physical distress    Intensity THRR unchanged THRR unchanged THRR unchanged THRR unchanged      Progression   Progression Continue to progress workloads to maintain intensity without signs/symptoms of physical distress. Continue to progress workloads to maintain intensity without signs/symptoms of physical distress. Continue to progress workloads to maintain intensity without signs/symptoms of physical distress. Continue to progress workloads to maintain  intensity without signs/symptoms of physical distress.    Average METs 1.95 2.6 2.95 3      Resistance Training   Training Prescription Yes Yes Yes Yes    Weight 3 3 4  lbs wts 4 lbs wts    Reps 10-15 10-15 10-15 10-15    Time 10 Minutes 10 Minutes 10 Minutes 10 Minutes      Recumbant Bike   Level 2 3 3 3     RPM 65 67 74 69    Watts 25 34 40 30    Minutes 15 15 15 15     METs 2.1 2.9 3.3 3      NuStep   Level 2 2 2 5     SPM 70 83 86 84    Minutes 15 15 15 15     METs 1.8 2.3 2.6 3  Home Exercise Plan   Plans to continue exercise at -- Home (comment) Home (comment) Home (comment)    Frequency -- Add 3 additional days to program exercise sessions. Add 3 additional days to program exercise sessions. Add 3 additional days to program exercise sessions.    Initial Home Exercises Provided -- 03/19/23 03/19/23 03/19/23             Exercise Comments:   Exercise Comments     Row Name 03/05/23 0844 03/19/23 0847 04/02/23 0830 04/16/23 1420     Exercise Comments Pt first day in the [facility] program. Pt tolerated exercise well with an average MET level of 1.95. Pt is learning his THRR, RPE and ExRx. Off to a great start. Reviewed MET's, goals and home ExRx. Pt tolerated exercise well with an average MET level of 2.6. Pt is feeling good about his goals and is increasing strength and stamina. He will continue to exercise on his own by walking, stretching and bike riding for 30-45 mins 2-3 days Reviewed MET's. Pt tolerated exercise well with an average MET level of 2.95. Pt is doing well and progressing his MET's Reviewed MET's and goals. Pt tolerated exercise well with an average MET level of 3.0. Pt is feeling good about his goals. He has been having some issues with chest pain, maybe muscular, due to moving homes. Nurse aware. Overall pt doing well and progressing MET's             Exercise Goals and Review:   Exercise Goals     Row Name 03/01/23 1409              Exercise Goals   Increase Physical Activity Yes       Intervention Provide advice, education, support and counseling about physical activity/exercise needs.;Develop an individualized exercise prescription for aerobic and resistive training based on initial evaluation findings, risk stratification, comorbidities and participant's personal goals.       Expected Outcomes Short Term: Attend rehab on a regular basis to increase amount of physical activity.;Long Term: Exercising regularly at least 3-5 days a week.;Long Term: Add in home exercise to make exercise part of routine and to increase amount of physical activity.       Increase Strength and Stamina Yes       Intervention Provide advice, education, support and counseling about physical activity/exercise needs.;Develop an individualized exercise prescription for aerobic and resistive training based on initial evaluation findings, risk stratification, comorbidities and participant's personal goals.       Expected Outcomes Short Term: Increase workloads from initial exercise prescription for resistance, speed, and METs.;Short Term: Perform resistance training exercises routinely during rehab and add in resistance training at home;Long Term: Improve cardiorespiratory fitness, muscular endurance and strength as measured by increased METs and functional capacity ( )       Able to understand and use rate of perceived exertion (RPE) scale Yes       Intervention Provide education and explanation on how to use RPE scale       Expected Outcomes Short Term: Able to use RPE daily in rehab to express subjective intensity level;Long Term:  Able to use RPE to guide intensity level when exercising independently       Knowledge and understanding of Target Heart Rate Range (THRR) Yes       Intervention Provide education and explanation of THRR including how the numbers were predicted and where they are located for reference       Expected  Outcomes Short Term: Able to  state/look up THRR;Long Term: Able to use THRR to govern intensity when exercising independently;Short Term: Able to use daily as guideline for intensity in rehab       Understanding of Exercise Prescription Yes       Intervention Provide education, explanation, and written materials on patient's individual exercise prescription       Expected Outcomes Short Term: Able to explain program exercise prescription;Long Term: Able to explain home exercise prescription to exercise independently                Exercise Goals Re-Evaluation :  Exercise Goals Re-Evaluation     Row Name 03/05/23 0842 03/19/23 0843 04/16/23 1418         Exercise Goal Re-Evaluation   Exercise Goals Review Increase Physical Activity;Understanding of Exercise Prescription;Increase Strength and Stamina;Knowledge and understanding of Target Heart Rate Range (THRR);Able to understand and use rate of perceived exertion (RPE) scale Increase Physical Activity;Understanding of Exercise Prescription;Increase Strength and Stamina;Knowledge and understanding of Target Heart Rate Range (THRR);Able to understand and use rate of perceived exertion (RPE) scale Increase Physical Activity;Understanding of Exercise Prescription;Increase Strength and Stamina;Knowledge and understanding of Target Heart Rate Range (THRR);Able to understand and use rate of perceived exertion (RPE) scale     Comments Pt first day in the [facility] program. Pt tolerated exercise well with an average MET level of 1.95. Pt is learning his THRR, RPE and ExRx. Off to a great start. Reviewed MET's, goals and home ExRx. Pt tolerated exercise well with an average MET level of 2.6. Pt is feeling good about his goals and is increasing strength and stamina. He will continue to exercise on his own by walking, stretching and bike riding for 30-45 mins 2-3 days Reviewed MET's and goals. Pt tolerated exercise well with an average MET level of 3.0. Pt is feeling good about his  goals. He has been having some issues with chest pain, maybe muscular, due to moving homes. Nurse aware. Overall pt doing well and progressing MET's     Expected Outcomes Will continue to monitor pt and progress workloads as tolerated without sign or symptom Will continue to monitor pt and progress workloads as tolerated without sign or symptom Will continue to monitor pt and progress workloads as tolerated without sign or symptom              Discharge Exercise Prescription (Final Exercise Prescription Changes):  Exercise Prescription Changes - 04/16/23 1416       Response to Exercise   Blood Pressure (Admit) 130/70    Blood Pressure (Exercise) 128/80   Over 10 exercise sessions, no BP   Blood Pressure (Exit) 108/64    Heart Rate (Admit) 74 bpm    Heart Rate (Exercise) 97 bpm    Heart Rate (Exit) 70 bpm    Rating of Perceived Exertion (Exercise) 12.5    Perceived Dyspnea (Exercise) 0    Symptoms 0    Comments REVD MET's and goals    Duration Progress to 30 minutes of  aerobic without signs/symptoms of physical distress    Intensity THRR unchanged      Progression   Progression Continue to progress workloads to maintain intensity without signs/symptoms of physical distress.    Average METs 3      Resistance Training   Training Prescription Yes    Weight 4 lbs wts    Reps 10-15    Time 10 Minutes      Recumbant  Bike   Level 3    RPM 69    Watts 30    Minutes 15    METs 3      NuStep   Level 5    SPM 84    Minutes 15    METs 3      Home Exercise Plan   Plans to continue exercise at Home (comment)    Frequency Add 3 additional days to program exercise sessions.    Initial Home Exercises Provided 03/19/23             Nutrition:  Target Goals: Understanding of nutrition guidelines, daily intake of sodium 1500mg , cholesterol 200mg , calories 30% from fat and 7% or less from saturated fats, daily to have 5 or more servings of fruits and  vegetables.  Biometrics:  Pre Biometrics - 03/01/23 1410       Pre Biometrics   Height 5\' 5"  (1.651 m)    Weight 100.6 kg    Waist Circumference 46.75 inches    Hip Circumference 41 inches    Waist to Hip Ratio 1.14 %    BMI (Calculated) 36.91    Triceps Skinfold 12 mm    % Body Fat 33.3 %    Grip Strength 42 kg    Flexibility 5 in    Single Leg Stand 30 seconds              Nutrition Therapy Plan and Nutrition Goals:  Nutrition Therapy & Goals - 04/06/23 0912       Nutrition Therapy   Diet Heart Healthy Diet    Drug/Food Interactions Statins/Certain Fruits      Personal Nutrition Goals   Nutrition Goal Patient to identify strategies for reducing cardiovascular risk by attending the [facility] education and nutrition series weekly.   goal not met.   Personal Goal #2 Patient to improve diet quality by using the plate method as a guide for meal planning to include lean protein/plant protein, fruits, vegetables, whole grains, nonfat dairy as part of a well-balanced diet.   goal in progress.   Comments Goal in progress. However, Christopher [name] does not attend the [facility] education and nutrition series. Christopher [name] has medical history of CABGx4, HTN, CAD, OSA. His cholesterol is well controlled. His A1c remains in a pre-diabetic range. He is up 4.4# since starting with our program. Patient will benefit from participation in intensive cardiac rehab for nutrition, exercise, and lifestyle modification.      Intervention Plan   Intervention Prescribe, educate and counsel regarding individualized specific dietary modifications aiming towards targeted core components such as weight, hypertension, lipid management, diabetes, heart failure and other comorbidities.;Nutrition handout(s) given to patient.    Expected Outcomes Short Term Goal: Understand basic principles of dietary content, such as calories, fat, sodium, cholesterol and nutrients.;Long Term Goal: Adherence to prescribed nutrition plan.              Nutrition Assessments:  Nutrition Assessments - 03/05/23 1126       Rate Your Plate Scores   Pre Score 60            MEDIFICTS Score Key: >=70 Need to make dietary changes  40-70 Heart Healthy Diet <= 40 Therapeutic Level Cholesterol Diet   Flowsheet Row INTENSIVE CARDIAC REHAB from 03/05/2023 in [facility] for Heart, Vascular, & Lung Health  Picture Your Plate Total Score on Admission 60      Picture Your Plate Scores: <01 Unhealthy dietary pattern with much room  for improvement. 41-50 Dietary pattern unlikely to meet recommendations for good health and room for improvement. 51-60 More healthful dietary pattern, with some room for improvement.  >60 Healthy dietary pattern, although there may be some specific behaviors that could be improved.    Nutrition Goals Re-Evaluation:  Nutrition Goals Re-Evaluation     Row Name 03/05/23 0956 04/06/23 0912           Goals   Current Weight 222 lb 10.6 oz (101 kg) 226 lb 3.1 oz (102.6 kg)      Comment HDL 19, LDL 26, A1c 5.7 no new labs; most recent labs  HDL 19, LDL 26, A1c 5.7      Expected Outcome Christopher [name] has medical history of CABGx4, HTN, CAD, OSA. His cholesterol is well controlled. His A1c remains in a pre-diabetic range. Patient will benefit from participation in intensive cardiac rehab for nutrition, exercise, and lifestyle modification. Goal in progress. However, Christopher [name] does not attend the [facility] education and nutrition series. Christopher [name] has medical history of CABGx4, HTN, CAD, OSA. His cholesterol is well controlled. His A1c remains in a pre-diabetic range. He is up 4.4# since starting with our program. Patient will benefit from participation in intensive cardiac rehab for nutrition, exercise, and lifestyle modification.               Nutrition Goals Re-Evaluation:  Nutrition Goals Re-Evaluation     Row Name 03/05/23 0956 04/06/23 0912           Goals   Current Weight 222 lb  10.6 oz (101 kg) 226 lb 3.1 oz (102.6 kg)      Comment HDL 19, LDL 26, A1c 5.7 no new labs; most recent labs  HDL 19, LDL 26, A1c 5.7      Expected Outcome Christopher [name] has medical history of CABGx4, HTN, CAD, OSA. His cholesterol is well controlled. His A1c remains in a pre-diabetic range. Patient will benefit from participation in intensive cardiac rehab for nutrition, exercise, and lifestyle modification. Goal in progress. However, Christopher [name] does not attend the [facility] education and nutrition series. Christopher [name] has medical history of CABGx4, HTN, CAD, OSA. His cholesterol is well controlled. His A1c remains in a pre-diabetic range. He is up 4.4# since starting with our program. Patient will benefit from participation in intensive cardiac rehab for nutrition, exercise, and lifestyle modification.               Nutrition Goals Discharge (Final Nutrition Goals Re-Evaluation):  Nutrition Goals Re-Evaluation - 04/06/23 0912       Goals   Current Weight 226 lb 3.1 oz (102.6 kg)    Comment no new labs; most recent labs  HDL 19, LDL 26, A1c 5.7    Expected Outcome Goal in progress. However, Christopher [name] does not attend the [facility] education and nutrition series. Jazper has medical history of CABGx4, HTN, CAD, OSA. His cholesterol is well controlled. His A1c remains in a pre-diabetic range. He is up 4.4# since starting with our program. Patient will benefit from participation in intensive cardiac rehab for nutrition, exercise, and lifestyle modification.             Psychosocial: Target Goals: Acknowledge presence or absence of significant depression and/or stress, maximize coping skills, provide positive support system. Participant is able to verbalize types and ability to use techniques and skills needed for reducing stress and depression.  Initial Review & Psychosocial Screening:  Initial Psych Review & Screening - 03/01/23 1412       Initial  Review   Current issues with Current Stress Concerns   some mild  stress with his health and his wifes health concerns. They are also planning a move. Does not feel the need for any additional resources at this time.   Source of Stress Concerns Family      Family Dynamics   Good Support System? Yes   Wife is good support     Barriers   Psychosocial barriers to participate in program The patient should benefit from training in stress management and relaxation.      Screening Interventions   Interventions Encouraged to exercise;Provide feedback about the scores to participant    Expected Outcomes Long Term Goal: Stressors or current issues are controlled or eliminated.;Short Term goal: Identification and review with participant of any Quality of Life or Depression concerns found by scoring the questionnaire.;Long Term goal: The participant improves quality of Life and PHQ9 Scores as seen by post scores and/or verbalization of changes             Quality of Life Scores:  Quality of Life - 03/01/23 1415       Quality of Life   Select Quality of Life      Quality of Life Scores   Health/Function Pre 13.93 %    Socioeconomic Pre 26.67 %    Psych/Spiritual Pre 20 %    Family Pre 20.4 %    GLOBAL Pre 18.52 %            Scores of 19 and below usually indicate a poorer quality of life in these areas.  A difference of  2-3 points is a clinically meaningful difference.  A difference of 2-3 points in the total score of the Quality of Life Index has been associated with significant improvement in overall quality of life, self-image, physical symptoms, and general health in studies assessing change in quality of life.  PHQ-9: Review Flowsheet  More data exists      03/01/2023 05/06/2018 03/14/2018 10/31/2017 09/10/2017  Depression screen PHQ 2/9  Decreased Interest 0 0 0 0 0  Down, Depressed, Hopeless 0 0 0 0 0  PHQ - 2 Score 0 0 0 0 0  Altered sleeping 0 - - - -  Tired, decreased energy 0 - - - -  Change in appetite 0 - - - -  Feeling bad or  failure about yourself  0 - - - -  Trouble concentrating 0 - - - -  Moving slowly or fidgety/restless 0 - - - -  Suicidal thoughts 0 - - - -  PHQ-9 Score 0 - - - -    Details           Interpretation of Total Score  Total Score Depression Severity:  1-4 = Minimal depression, 5-9 = Mild depression, 10-14 = Moderate depression, 15-19 = Moderately severe depression, 20-27 = Severe depression   Psychosocial Evaluation and Intervention:   Psychosocial Re-Evaluation:  Psychosocial Re-Evaluation     Row Name 03/15/23 0956             Psychosocial Re-Evaluation   Current issues with Current Stress Concerns       Comments Christopher [name] started cardiac rehab on 03/05/23. Christopher [name] has not voiced any increased concersn or stressors during exercise. Will review quality of life questionnaire in the upcoming week       Expected Outcomes Christopher [name] will have controlled or decreased stress upon completing cardiac rehab       Interventions Encouraged to  attend Cardiac Rehabilitation for the exercise;Relaxation education;Encouraged to attend Pulmonary Rehabilitation for the exercise       Continue Psychosocial Services  Follow up required by staff         Initial Review   Source of Stress Concerns Family       Comments will continue to monitor and offer support as needed                Psychosocial Discharge (Final Psychosocial Re-Evaluation):  Psychosocial Re-Evaluation - 03/15/23 0956       Psychosocial Re-Evaluation   Current issues with Current Stress Concerns    Comments Christopher [name] started cardiac rehab on 03/05/23. Christopher [name] has not voiced any increased concersn or stressors during exercise. Will review quality of life questionnaire in the upcoming week    Expected Outcomes Christopher [name] will have controlled or decreased stress upon completing cardiac rehab    Interventions Encouraged to attend Cardiac Rehabilitation for the exercise;Relaxation education;Encouraged to attend Pulmonary Rehabilitation for the  exercise    Continue Psychosocial Services  Follow up required by staff      Initial Review   Source of Stress Concerns Family    Comments will continue to monitor and offer support as needed             Vocational Rehabilitation: Provide vocational rehab assistance to qualifying candidates.   Vocational Rehab Evaluation & Intervention:  Vocational Rehab - 03/01/23 1420       Initial Vocational Rehab Evaluation & Intervention   Assessment shows need for Vocational Rehabilitation No   No needs at this time            Education: Education Goals: Education classes will be provided on a weekly basis, covering required topics. Participant will state understanding/return demonstration of topics presented.    Education     Row Name 03/12/23 0800     Education   Cardiac Education Topics Pritikin   Select Core Videos     Core Videos   Educator Exercise Physiologist   Select Exercise Education   Exercise Education Biomechanial Limitations   Instruction Review Code 1- Verbalizes Understanding   Class Start Time 0815   Class Stop Time 0855   Class Time Calculation (min) 40 min            Core Videos: Exercise    Move It!  Clinical staff conducted group or individual video education with verbal and written material and guidebook.  Patient learns the recommended Pritikin exercise program. Exercise with the goal of living a long, healthy life. Some of the health benefits of exercise include controlled diabetes, healthier blood pressure levels, improved cholesterol levels, improved heart and lung capacity, improved sleep, and better body composition. Everyone should speak with their doctor before starting or changing an exercise routine.  Biomechanical Limitations Clinical staff conducted group or individual video education with verbal and written material and guidebook.  Patient learns how biomechanical limitations can impact exercise and how we can mitigate and  possibly overcome limitations to have an impactful and balanced exercise routine.  Body Composition Clinical staff conducted group or individual video education with verbal and written material and guidebook.  Patient learns that body composition (ratio of muscle mass to fat mass) is a key component to assessing overall fitness, rather than body weight alone. Increased fat mass, especially visceral belly fat, can put [place] at increased risk for metabolic syndrome, type 2 diabetes, heart disease, and even death. It is recommended to combine diet and exercise (  cardiovascular and resistance training) to improve your body composition. Seek guidance from your physician and exercise physiologist before implementing an exercise routine.  Exercise Action Plan Clinical staff conducted group or individual video education with verbal and written material and guidebook.  Patient learns the recommended strategies to achieve and enjoy long-term exercise adherence, including variety, self-motivation, self-efficacy, and positive decision making. Benefits of exercise include fitness, good health, weight management, more energy, better sleep, less stress, and overall well-being.  Medical   Heart Disease Risk Reduction Clinical staff conducted group or individual video education with verbal and written material and guidebook.  Patient learns our heart is our most vital organ as it circulates oxygen, nutrients, white blood cells, and hormones throughout the entire body, and carries waste away. Data supports a plant-based eating plan like the Pritikin Program for its effectiveness in slowing progression of and reversing heart disease. The video provides a number of recommendations to address heart disease.   Metabolic Syndrome and Belly Fat  Clinical staff conducted group or individual video education with verbal and written material and guidebook.  Patient learns what metabolic syndrome is, how it leads to heart disease,  and how one can reverse it and keep it from coming back. You have metabolic syndrome if you have 3 of the following 5 criteria: abdominal obesity, high blood pressure, high triglycerides, low HDL cholesterol, and high blood sugar.  Hypertension and Heart Disease Clinical staff conducted group or individual video education with verbal and written material and guidebook.  Patient learns that high blood pressure, or hypertension, is very common in the [place]. Hypertension is largely due to excessive salt intake, but other important risk factors include being overweight, physical inactivity, drinking too much alcohol, smoking, and not eating enough potassium from fruits and vegetables. High blood pressure is a leading risk factor for heart attack, stroke, congestive heart failure, dementia, kidney failure, and premature death. Long-term effects of excessive salt intake include stiffening of the arteries and thickening of heart muscle and organ damage. Recommendations include ways to reduce hypertension and the risk of heart disease.  Diseases of Our Time - Focusing on Diabetes Clinical staff conducted group or individual video education with verbal and written material and guidebook.  Patient learns why the best way to stop diseases of our time is prevention, through food and other lifestyle changes. Medicine (such as prescription pills and surgeries) is often only a Band-Aid on the problem, not a long-term solution. Most common diseases of our time include obesity, type 2 diabetes, hypertension, heart disease, and cancer. The Pritikin Program is recommended and has been proven to help reduce, reverse, and/or prevent the damaging effects of metabolic syndrome.  Nutrition   Overview of the Pritikin Eating Plan  Clinical staff conducted group or individual video education with verbal and written material and guidebook.  Patient learns about the Pritikin Eating Plan for disease risk reduction. The  Pritikin Eating Plan emphasizes a wide variety of unrefined, minimally-processed carbohydrates, like fruits, vegetables, whole grains, and legumes. Go, Caution, and Stop food choices are explained. Plant-based and lean animal proteins are emphasized. Rationale provided for low sodium intake for blood pressure control, low added sugars for blood sugar stabilization, and low added fats and oils for coronary artery disease risk reduction and weight management.  Calorie Density  Clinical staff conducted group or individual video education with verbal and written material and guidebook.  Patient learns about calorie density and how it impacts the Pritikin Eating Plan. Knowing the  characteristics of the food you choose will help you decide whether those foods will lead to weight gain or weight loss, and whether you want to consume more or less of them. Weight loss is usually a side effect of the Pritikin Eating Plan because of its focus on low calorie-dense foods.  Label Reading  Clinical staff conducted group or individual video education with verbal and written material and guidebook.  Patient learns about the Pritikin recommended label reading guidelines and corresponding recommendations regarding calorie density, added sugars, sodium content, and whole grains.  Dining Out - Part 1  Clinical staff conducted group or individual video education with verbal and written material and guidebook.  Patient learns that restaurant meals can be sabotaging because they can be so high in calories, fat, sodium, and/or sugar. Patient learns recommended strategies on how to positively address this and avoid unhealthy pitfalls.  Facts on Fats  Clinical staff conducted group or individual video education with verbal and written material and guidebook.  Patient learns that lifestyle modifications can be just as effective, if not more so, as many medications for lowering your risk of heart disease. A Pritikin lifestyle can  help to reduce your risk of inflammation and atherosclerosis (cholesterol build-up, or plaque, in the artery walls). Lifestyle interventions such as dietary choices and physical activity address the cause of atherosclerosis. A review of the types of fats and their impact on blood cholesterol levels, along with dietary recommendations to reduce fat intake is also included.  Nutrition Action Plan  Clinical staff conducted group or individual video education with verbal and written material and guidebook.  Patient learns how to incorporate Pritikin recommendations into their lifestyle. Recommendations include planning and keeping personal health goals in mind as an important part of their success.  Healthy Mind-Set    Healthy Minds, Bodies, Hearts  Clinical staff conducted group or individual video education with verbal and written material and guidebook.  Patient learns how to identify when they are stressed. Video will discuss the impact of that stress, as well as the many benefits of stress management. Patient will also be introduced to stress management techniques. The way we think, act, and feel has an impact on our hearts.  How Our Thoughts Can Heal Our Hearts  Clinical staff conducted group or individual video education with verbal and written material and guidebook.  Patient learns that negative thoughts can cause depression and anxiety. This can result in negative lifestyle behavior and serious health problems. Cognitive behavioral therapy is an effective method to help control our thoughts in order to change and improve our emotional outlook.  Additional Videos:  Exercise    Improving Performance  Clinical staff conducted group or individual video education with verbal and written material and guidebook.  Patient learns to use a non-linear approach by alternating intensity levels and lengths of time spent exercising to help burn more calories and lose more body fat. Cardiovascular exercise  helps improve heart health, metabolism, hormonal balance, blood sugar control, and recovery from fatigue. Resistance training improves strength, endurance, balance, coordination, reaction time, metabolism, and muscle mass. Flexibility exercise improves circulation, posture, and balance. Seek guidance from your physician and exercise physiologist before implementing an exercise routine and learn your capabilities and proper form for all exercise.  Introduction to Yoga  Clinical staff conducted group or individual video education with verbal and written material and guidebook.  Patient learns about yoga, a discipline of the coming together of mind, breath, and body. The benefits of  yoga include improved flexibility, improved range of motion, better posture and core strength, increased lung function, weight loss, and positive self-image. Yoga's heart health benefits include lowered blood pressure, healthier heart rate, decreased cholesterol and triglyceride levels, improved immune function, and reduced stress. Seek guidance from your physician and exercise physiologist before implementing an exercise routine and learn your capabilities and proper form for all exercise.  Medical   Aging: Enhancing Your Quality of Life  Clinical staff conducted group or individual video education with verbal and written material and guidebook.  Patient learns key strategies and recommendations to stay in good physical health and enhance quality of life, such as prevention strategies, having an advocate, securing a Health Care Proxy and Power of Attorney, and keeping a list of medications and system for tracking them. It also discusses how to avoid risk for bone loss.  Biology of Weight Control  Clinical staff conducted group or individual video education with verbal and written material and guidebook.  Patient learns that weight gain occurs because we consume more calories than we burn (eating more, moving less). Even if  your body weight is normal, you may have higher ratios of fat compared to muscle mass. Too much body fat puts you at increased risk for cardiovascular disease, heart attack, stroke, type 2 diabetes, and obesity-related cancers. In addition to exercise, following the Pritikin Eating Plan can help reduce your risk.  Decoding Lab Results  Clinical staff conducted group or individual video education with verbal and written material and guidebook.  Patient learns that lab test reflects one measurement whose values change over time and are influenced by many factors, including medication, stress, sleep, exercise, food, hydration, pre-existing medical conditions, and more. It is recommended to use the knowledge from this video to become more involved with your lab results and evaluate your numbers to speak with your doctor.   Diseases of Our Time - Overview  Clinical staff conducted group or individual video education with verbal and written material and guidebook.  Patient learns that according to the CDC, 50% to 70% of chronic diseases (such as obesity, type 2 diabetes, elevated lipids, hypertension, and heart disease) are avoidable through lifestyle improvements including healthier food choices, listening to satiety cues, and increased physical activity.  Sleep Disorders Clinical staff conducted group or individual video education with verbal and written material and guidebook.  Patient learns how good quality and duration of sleep are important to overall health and well-being. Patient also learns about sleep disorders and how they impact health along with recommendations to address them, including discussing with a physician.  Nutrition  Dining Out - Part 2 Clinical staff conducted group or individual video education with verbal and written material and guidebook.  Patient learns how to plan ahead and communicate in order to maximize their dining experience in a healthy and nutritious manner.  Included are recommended food choices based on the type of restaurant the patient is visiting.   Fueling a Banker conducted group or individual video education with verbal and written material and guidebook.  There is a strong connection between our food choices and our health. Diseases like obesity and type 2 diabetes are very prevalent and are in large-part due to lifestyle choices. The Pritikin Eating Plan provides plenty of food and hunger-curbing satisfaction. It is easy to follow, affordable, and helps reduce health risks.  Menu Workshop  Clinical staff conducted group or individual video education with verbal and written material and guidebook.  Patient learns that restaurant meals can sabotage health goals because they are often packed with calories, fat, sodium, and sugar. Recommendations include strategies to plan ahead and to communicate with the manager, chef, or server to help order a healthier meal.  Planning Your Eating Strategy  Clinical staff conducted group or individual video education with verbal and written material and guidebook.  Patient learns about the Pritikin Eating Plan and its benefit of reducing the risk of disease. The Pritikin Eating Plan does not focus on calories. Instead, it emphasizes high-quality, nutrient-rich foods. By knowing the characteristics of the foods, we choose, we can determine their calorie density and make informed decisions.  Targeting Your Nutrition Priorities  Clinical staff conducted group or individual video education with verbal and written material and guidebook.  Patient learns that lifestyle habits have a tremendous impact on disease risk and progression. This video provides eating and physical activity recommendations based on your personal health goals, such as reducing LDL cholesterol, losing weight, preventing or controlling type 2 diabetes, and reducing high blood pressure.  Vitamins and Minerals  Clinical staff  conducted group or individual video education with verbal and written material and guidebook.  Patient learns different ways to obtain key vitamins and minerals, including through a recommended healthy diet. It is important to discuss all supplements you take with your doctor.   Healthy Mind-Set    Smoking Cessation  Clinical staff conducted group or individual video education with verbal and written material and guidebook.  Patient learns that cigarette smoking and tobacco addiction pose a serious health risk which affects millions of people. Stopping smoking will significantly reduce the risk of heart disease, lung disease, and many forms of cancer. Recommended strategies for quitting are covered, including working with your doctor to develop a successful plan.  Culinary   Becoming a Set designer conducted group or individual video education with verbal and written material and guidebook.  Patient learns that cooking at home can be healthy, cost-effective, quick, and puts them in control. Keys to cooking healthy recipes will include looking at your recipe, assessing your equipment needs, planning ahead, making it simple, choosing cost-effective seasonal ingredients, and limiting the use of added fats, salts, and sugars.  Cooking - Breakfast and Snacks  Clinical staff conducted group or individual video education with verbal and written material and guidebook.  Patient learns how important breakfast is to satiety and nutrition through the entire day. Recommendations include key foods to eat during breakfast to help stabilize blood sugar levels and to prevent overeating at meals later in the day. Planning ahead is also a key component.  Cooking - Educational psychologist conducted group or individual video education with verbal and written material and guidebook.  Patient learns eating strategies to improve overall health, including an approach to cook more at home.  Recommendations include thinking of animal protein as a side on your plate rather than center stage and focusing instead on lower calorie dense options like vegetables, fruits, whole grains, and plant-based proteins, such as beans. Making sauces in large quantities to freeze for later and leaving the skin on your vegetables are also recommended to maximize your experience.  Cooking - Healthy Salads and Dressing Clinical staff conducted group or individual video education with verbal and written material and guidebook.  Patient learns that vegetables, fruits, whole grains, and legumes are the foundations of the Pritikin Eating Plan. Recommendations include how to incorporate each of these in flavorful and  healthy salads, and how to create homemade salad dressings. Proper handling of ingredients is also covered. Cooking - Soups and [employer] - Soups and Desserts Clinical staff conducted group or individual video education with verbal and written material and guidebook.  Patient learns that Pritikin soups and desserts make for easy, nutritious, and delicious snacks and meal components that are low in sodium, fat, sugar, and calorie density, while high in vitamins, minerals, and filling fiber. Recommendations include simple and healthy ideas for soups and desserts.   Overview     The Pritikin Solution Program Overview Clinical staff conducted group or individual video education with verbal and written material and guidebook.  Patient learns that the results of the Pritikin Program have been documented in more than 100 articles published in peer-reviewed journals, and the benefits include reducing risk factors for (and, in some cases, even reversing) high cholesterol, high blood pressure, type 2 diabetes, obesity, and more! An overview of the three key pillars of the Pritikin Program will be covered: eating well, doing regular exercise, and having a healthy mind-set.  WORKSHOPS   Exercise: Exercise Basics: Building Your Action Plan Clinical staff led group instruction and group discussion with PowerPoint presentation and patient guidebook. To enhance the learning environment the use of posters, models and videos may be added. At the conclusion of this workshop, patients will comprehend the difference between physical activity and exercise, as well as the benefits of incorporating both, into their routine. Patients will understand the FITT (Frequency, Intensity, Time, and Type) principle and how to use it to build an exercise action plan. In addition, safety concerns and other considerations for exercise and cardiac rehab will be addressed by the presenter. The purpose of this lesson is to promote a comprehensive and effective weekly exercise routine in order to improve patients' overall level of fitness.   Managing Heart Disease: Your Path to a Healthier Heart Clinical staff led group instruction and group discussion with PowerPoint presentation and patient guidebook. To enhance the learning environment the use of posters, models and videos may be added.At the conclusion of this workshop, patients will understand the anatomy and physiology of the heart. Additionally, they will understand how Pritikin's three pillars impact the risk factors, the progression, and the management of heart disease.  The purpose of this lesson is to provide a high-level overview of the heart, heart disease, and how the Pritikin lifestyle positively impacts risk factors.  Exercise Biomechanics Clinical staff led group instruction and group discussion with PowerPoint presentation and patient guidebook. To enhance the learning environment the use of posters, models and videos may be added. Patients will learn how the structural parts of their bodies function and how these functions impact their daily activities, movement, and exercise. Patients will learn how to promote a neutral spine, learn how  to manage pain, and identify ways to improve their physical movement in order to promote healthy living. The purpose of this lesson is to expose patients to common physical limitations that impact physical activity. Participants will learn practical ways to adapt and manage aches and pains, and to minimize their effect on regular exercise. Patients will learn how to maintain good posture while sitting, walking, and lifting.  Balance Training and Fall Prevention  Clinical staff led group instruction and group discussion with PowerPoint presentation and patient guidebook. To enhance the learning environment the use of posters, models and videos may be added. At the conclusion of this workshop, patients will understand the importance of their  sensorimotor skills (vision, proprioception, and the vestibular system) in maintaining their ability to balance as they age. Patients will apply a variety of balancing exercises that are appropriate for their current level of function. Patients will understand the common causes for poor balance, possible solutions to these problems, and ways to modify their physical environment in order to minimize their fall risk. The purpose of this lesson is to teach patients about the importance of maintaining balance as they age and ways to minimize their risk of falling.  WORKSHOPS   Nutrition:  Fueling a Ship broker led group instruction and group discussion with PowerPoint presentation and patient guidebook. To enhance the learning environment the use of posters, models and videos may be added. Patients will review the foundational principles of the Pritikin Eating Plan and understand what constitutes a serving size in each of the food groups. Patients will also learn Pritikin-friendly foods that are better choices when away from home and review make-ahead meal and snack options. Calorie density will be reviewed and applied to three nutrition priorities:  weight maintenance, weight loss, and weight gain. The purpose of this lesson is to reinforce (in a group setting) the key concepts around what patients are recommended to eat and how to apply these guidelines when away from home by planning and selecting Pritikin-friendly options. Patients will understand how calorie density may be adjusted for different weight management goals.  Mindful Eating  Clinical staff led group instruction and group discussion with PowerPoint presentation and patient guidebook. To enhance the learning environment the use of posters, models and videos may be added. Patients will briefly review the concepts of the Pritikin Eating Plan and the importance of low-calorie dense foods. The concept of mindful eating will be introduced as well as the importance of paying attention to internal hunger signals. Triggers for non-hunger eating and techniques for dealing with triggers will be explored. The purpose of this lesson is to provide patients with the opportunity to review the basic principles of the Pritikin Eating Plan, discuss the value of eating mindfully and how to measure internal cues of hunger and fullness using the Hunger Scale. Patients will also discuss reasons for non-hunger eating and learn strategies to use for controlling emotional eating.  Targeting Your Nutrition Priorities Clinical staff led group instruction and group discussion with PowerPoint presentation and patient guidebook. To enhance the learning environment the use of posters, models and videos may be added. Patients will learn how to determine their genetic susceptibility to disease by reviewing their family history. Patients will gain insight into the importance of diet as part of an overall healthy lifestyle in mitigating the impact of genetics and other environmental insults. The purpose of this lesson is to provide patients with the opportunity to assess their personal nutrition priorities by looking at  their family history, their own health history and current risk factors. Patients will also be able to discuss ways of prioritizing and modifying the Pritikin Eating Plan for their highest risk areas  Menu  Clinical staff led group instruction and group discussion with PowerPoint presentation and patient guidebook. To enhance the learning environment the use of posters, models and videos may be added. Using menus brought in from [employer], or printed from [employer], patients will apply the Pritikin dining out guidelines that were presented in the [employer] video. Patients will also be able to practice these guidelines in a variety of provided scenarios. The purpose of this lesson is to  provide patients with the opportunity to practice hands-on learning of the [employer] guidelines with actual menus and practice scenarios.  Label Reading Clinical staff led group instruction and group discussion with PowerPoint presentation and patient guidebook. To enhance the learning environment the use of posters, models and videos may be added. Patients will review and discuss the Pritikin label reading guidelines presented in [employer]'s Label Reading Educational series video. Using fool labels brought in from local grocery stores and markets, patients will apply the label reading guidelines and determine if the packaged food meet the Pritikin guidelines. The purpose of this lesson is to provide patients with the opportunity to review, discuss, and practice hands-on learning of the Pritikin Label Reading guidelines with actual packaged food labels. Cooking School  Pritikin's [employer] are designed to teach patients ways to prepare quick, simple, and affordable recipes at home. The importance of nutrition's role in chronic disease risk reduction is reflected in its emphasis in the overall Pritikin program. By learning how to prepare essential core Pritikin Eating  Plan recipes, patients will increase control over what they eat; be able to customize the flavor of foods without the use of added salt, sugar, or fat; and improve the quality of the food they consume. By learning a set of core recipes which are easily assembled, quickly prepared, and affordable, patients are more likely to prepare more healthy foods at home. These workshops focus on convenient breakfasts, simple entres, side dishes, and desserts which can be prepared with minimal effort and are consistent with nutrition recommendations for cardiovascular risk reduction. Cooking [employer] are taught by a Armed forces logistics/support/administrative officer (RD) who has been trained by the [employer]. The chef or RD has a clear understanding of the importance of minimizing - if not completely eliminating - added fat, sugar, and sodium in recipes. Throughout the series of Cooking School Workshop sessions, patients will learn about healthy ingredients and efficient methods of cooking to build confidence in their capability to prepare    Cooking School weekly topics:  Adding Flavor- Sodium-Free  Fast and Healthy Breakfasts  Powerhouse Plant-Based Proteins  Satisfying Salads and Dressings  Simple Sides and Sauces  International Cuisine-Spotlight on the [employer] Zones  Delicious Desserts  Savory Soups  [employer] - Meals in a Astronomer Appetizers and Snacks  Comforting Weekend Breakfasts  One-Pot Wonders   Fast Evening Meals  Landscape architect Your Pritikin Plate  WORKSHOPS   Healthy Mindset (Psychosocial):  Focused Goals, Sustainable Changes Clinical staff led group instruction and group discussion with PowerPoint presentation and patient guidebook. To enhance the learning environment the use of posters, models and videos may be added. Patients will be able to apply effective goal setting strategies to establish at least one personal goal, and then take consistent, meaningful  action toward that goal. They will learn to identify common barriers to achieving personal goals and develop strategies to overcome them. Patients will also gain an understanding of how our mind-set can impact our ability to achieve goals and the importance of cultivating a positive and growth-oriented mind-set. The purpose of this lesson is to provide patients with a deeper understanding of how to set and achieve personal goals, as well as the tools and strategies needed to overcome common obstacles which may arise along the way.  From Head to Heart: The Power of a Healthy Outlook  Clinical staff led group instruction and group discussion with PowerPoint presentation and patient guidebook.  To enhance the learning environment the use of posters, models and videos may be added. Patients will be able to recognize and describe the impact of emotions and mood on physical health. They will discover the importance of self-care and explore self-care practices which may work for them. Patients will also learn how to utilize the 4 C's to cultivate a healthier outlook and better manage stress and challenges. The purpose of this lesson is to demonstrate to patients how a healthy outlook is an essential part of maintaining good health, especially as they continue their cardiac rehab journey.  Healthy Sleep for a Healthy Heart Clinical staff led group instruction and group discussion with PowerPoint presentation and patient guidebook. To enhance the learning environment the use of posters, models and videos may be added. At the conclusion of this workshop, patients will be able to demonstrate knowledge of the importance of sleep to overall health, well-being, and quality of life. They will understand the symptoms of, and treatments for, common sleep disorders. Patients will also be able to identify daytime and nighttime behaviors which impact sleep, and they will be able to apply these tools to help manage sleep-related  challenges. The purpose of this lesson is to provide patients with a general overview of sleep and outline the importance of quality sleep. Patients will learn about a few of the most common sleep disorders. Patients will also be introduced to the concept of "sleep hygiene," and discover ways to self-manage certain sleeping problems through simple daily behavior changes. Finally, the workshop will motivate patients by clarifying the links between quality sleep and their goals of heart-healthy living.   Recognizing and Reducing Stress Clinical staff led group instruction and group discussion with PowerPoint presentation and patient guidebook. To enhance the learning environment the use of posters, models and videos may be added. At the conclusion of this workshop, patients will be able to understand the types of stress reactions, differentiate between acute and chronic stress, and recognize the impact that chronic stress has on their health. They will also be able to apply different coping mechanisms, such as reframing negative self-talk. Patients will have the opportunity to practice a variety of stress management techniques, such as deep abdominal breathing, progressive muscle relaxation, and/or guided imagery.  The purpose of this lesson is to educate patients on the role of stress in their lives and to provide healthy techniques for coping with it.  Learning Barriers/Preferences:  Learning Barriers/Preferences - 03/01/23 1416       Learning Barriers/Preferences   Learning Barriers None    Learning Preferences Group Instruction;Individual Instruction;Pictoral;Video;Written Material;Skilled Demonstration;Computer/Internet             Education Topics:  Knowledge Questionnaire Score:  Knowledge Questionnaire Score - 03/01/23 1416       Knowledge Questionnaire Score   Pre Score 19/24             Core Components/Risk Factors/Patient Goals at Admission:  Personal Goals and Risk  Factors at Admission - 03/01/23 1422       Core Components/Risk Factors/Patient Goals on Admission    Weight Management Yes;Obesity;Weight Loss    Intervention Weight Management: Develop a combined nutrition and exercise program designed to reach desired caloric intake, while maintaining appropriate intake of nutrient and fiber, sodium and fats, and appropriate energy expenditure required for the weight goal.;Weight Management: Provide education and appropriate resources to help participant work on and attain dietary goals.;Weight Management/Obesity: Establish reasonable short term and long term weight goals.;Obesity: Provide education  and appropriate resources to help participant work on and attain dietary goals.    Admit Weight 220 lb 14.4 oz (100.2 kg)    Expected Outcomes Short Term: Continue to assess and modify interventions until short term weight is achieved;Long Term: Adherence to nutrition and physical activity/exercise program aimed toward attainment of established weight goal;Weight Loss: Understanding of general recommendations for a balanced deficit meal plan, which promotes 1-2 lb weight loss per week and includes a negative energy balance of [phone] kcal/d;Understanding recommendations for meals to include 15-35% energy as protein, 25-35% energy from fat, 35-60% energy from carbohydrates, less than 200mg  of dietary cholesterol, 20-35 gm of total fiber daily;Understanding of distribution of calorie intake throughout the day with the consumption of 4-5 meals/snacks    Hypertension Yes    Intervention Provide education on lifestyle modifcations including regular physical activity/exercise, weight management, moderate sodium restriction and increased consumption of fresh fruit, vegetables, and low fat dairy, alcohol moderation, and smoking cessation.;Monitor prescription use compliance.    Expected Outcomes Short Term: Continued assessment and intervention until BP is < 140/59mm HG in  hypertensive participants. < 130/38mm HG in hypertensive participants with diabetes, heart failure or chronic kidney disease.;Long Term: Maintenance of blood pressure at goal levels.    Lipids Yes    Intervention Provide education and support for participant on nutrition & aerobic/resistive exercise along with prescribed medications to achieve LDL 70mg , HDL >40mg .    Expected Outcomes Short Term: Participant states understanding of desired cholesterol values and is compliant with medications prescribed. Participant is following exercise prescription and nutrition guidelines.;Long Term: Cholesterol controlled with medications as prescribed, with individualized exercise RX and with personalized nutrition plan. Value goals: LDL < 70mg , HDL > 40 mg.    Stress Yes    Intervention Offer individual and/or small group education and counseling on adjustment to heart disease, stress management and health-related lifestyle change. Teach and support self-help strategies.;Refer participants experiencing significant psychosocial distress to appropriate mental health specialists for further evaluation and treatment. When possible, include family members and significant others in education/counseling sessions.    Expected Outcomes Short Term: Participant demonstrates changes in health-related behavior, relaxation and other stress management skills, ability to obtain effective social support, and compliance with psychotropic medications if prescribed.;Long Term: Emotional wellbeing is indicated by absence of clinically significant psychosocial distress or social isolation.    Personal Goal Other Yes    Personal Goal Pt would like to get back to normal, and have long life, know more about sternal precautions    Intervention Will continue to monitor pt and progress workloads as tolerated without sign or symptom    Expected Outcomes Pt will achieve his goals             Core Components/Risk Factors/Patient Goals  Review:   Goals and Risk Factor Review     Row Name 03/15/23 0959 04/16/23 1717           Core Components/Risk Factors/Patient Goals Review   Personal Goals Review Weight Management/Obesity;Hypertension;Lipids;Stress Weight Management/Obesity;Hypertension;Lipids;Stress      Review Christopher [name] started cardiac rehab on 03/05/23 and is off to a good start to exercise. Vital signs have been stable. HDL 19, other Lipid values WNL Christopher [name] continues to do well with exercise at cardiac rehab. Vital signs remain stable.      Expected Outcomes Christopher [name] will continue to participate in cardiac rehab for exercise, nutrition and lifestyle modifications Christopher [name] will continue to participate in cardiac rehab for exercise, nutrition and lifestyle modifications  Core Components/Risk Factors/Patient Goals at Discharge (Final Review):   Goals and Risk Factor Review - 04/16/23 1717       Core Components/Risk Factors/Patient Goals Review   Personal Goals Review Weight Management/Obesity;Hypertension;Lipids;Stress    Review Christopher [name] continues to do well with exercise at cardiac rehab. Vital signs remain stable.    Expected Outcomes Christopher [name] will continue to participate in cardiac rehab for exercise, nutrition and lifestyle modifications             ITP Comments:  ITP Comments     Row Name 03/01/23 1100 03/15/23 0940 04/16/23 1715       ITP Comments Dr. Armanda Magic medical director. Introduction to [facility] education / intensive cardiac rehab. Initial orientation packet reviewed with patient. 30 Day ITP Review. Damoney started cardiac rehab on 03/05/23 and is off to a good start to exercise. 30 Day ITP Review. Christopher [name] has good attendance and participation with exercise at cardiac rehab.              Comments: See ITP comments.Thayer Headings RN BSN

## 2023-04-16 NOTE — Progress Notes (Signed)
Advised Cardiac rehab staff of feeling of chest tightness while exercising on the Recumbent bike.  Pt describes this as 4/10  with movement of his upper body.  BP 128/80.   No changes in lead II on the telemetry monitor. Pt remarked that he was moving over the weekend.  Lifted  several boxes of about 40-50 pounds over the weekend. Advised to lower workloads for today.  Ambulated around the track and the tightness went away 0/10  Adjusted seat position on second station which helped, tightness of  2/10. Took some ibuprofen this morning and felt it was helping. Will continue to monitor. Alanson Aly, BSN Cardiac and Emergency planning/management officer

## 2023-04-18 ENCOUNTER — Ambulatory Visit (HOSPITAL_COMMUNITY): Payer: No Typology Code available for payment source

## 2023-04-18 ENCOUNTER — Encounter (HOSPITAL_COMMUNITY)
Admission: RE | Admit: 2023-04-18 | Discharge: 2023-04-18 | Disposition: A | Discharge status: Home / Self Care | Payer: No Typology Code available for payment source | Source: Ambulatory Visit | Attending: Cardiology

## 2023-04-18 DIAGNOSIS — Z951 Presence of aortocoronary bypass graft: Secondary | ICD-10-CM | POA: Diagnosis not present

## 2023-04-20 ENCOUNTER — Ambulatory Visit (HOSPITAL_COMMUNITY): Payer: No Typology Code available for payment source

## 2023-04-20 ENCOUNTER — Encounter (HOSPITAL_COMMUNITY)
Admission: RE | Admit: 2023-04-20 | Discharge: 2023-04-20 | Disposition: A | Discharge status: Home / Self Care | Payer: No Typology Code available for payment source | Source: Ambulatory Visit | Attending: Cardiology | Admitting: Cardiology

## 2023-04-20 DIAGNOSIS — Z951 Presence of aortocoronary bypass graft: Secondary | ICD-10-CM | POA: Diagnosis not present

## 2023-04-23 ENCOUNTER — Encounter (HOSPITAL_COMMUNITY)
Admission: RE | Admit: 2023-04-23 | Discharge: 2023-04-23 | Disposition: A | Discharge status: Home / Self Care | Payer: No Typology Code available for payment source | Source: Ambulatory Visit | Attending: Cardiology

## 2023-04-23 ENCOUNTER — Ambulatory Visit (HOSPITAL_COMMUNITY): Payer: No Typology Code available for payment source

## 2023-04-23 DIAGNOSIS — Z951 Presence of aortocoronary bypass graft: Secondary | ICD-10-CM | POA: Diagnosis not present

## 2023-04-25 ENCOUNTER — Encounter (HOSPITAL_COMMUNITY)
Admission: RE | Admit: 2023-04-25 | Discharge: 2023-04-25 | Disposition: A | Discharge status: Home / Self Care | Payer: No Typology Code available for payment source | Source: Ambulatory Visit | Attending: Cardiology | Admitting: Cardiology

## 2023-04-25 ENCOUNTER — Ambulatory Visit (HOSPITAL_COMMUNITY): Payer: No Typology Code available for payment source

## 2023-04-25 DIAGNOSIS — Z951 Presence of aortocoronary bypass graft: Secondary | ICD-10-CM

## 2023-04-27 ENCOUNTER — Encounter (HOSPITAL_COMMUNITY): Payer: No Typology Code available for payment source

## 2023-04-27 ENCOUNTER — Ambulatory Visit (HOSPITAL_COMMUNITY): Payer: No Typology Code available for payment source

## 2023-04-30 ENCOUNTER — Ambulatory Visit (HOSPITAL_COMMUNITY): Payer: No Typology Code available for payment source

## 2023-04-30 ENCOUNTER — Encounter (HOSPITAL_COMMUNITY)
Admission: RE | Admit: 2023-04-30 | Discharge: 2023-04-30 | Disposition: A | Discharge status: Home / Self Care | Payer: No Typology Code available for payment source | Source: Ambulatory Visit | Attending: Cardiology | Admitting: Cardiology

## 2023-04-30 DIAGNOSIS — Z48812 Encounter for surgical aftercare following surgery on the circulatory system: Secondary | ICD-10-CM | POA: Diagnosis not present

## 2023-04-30 DIAGNOSIS — Z951 Presence of aortocoronary bypass graft: Secondary | ICD-10-CM | POA: Insufficient documentation

## 2023-05-02 ENCOUNTER — Encounter (HOSPITAL_COMMUNITY)
Admission: RE | Admit: 2023-05-02 | Discharge: 2023-05-02 | Disposition: A | Discharge status: Home / Self Care | Payer: No Typology Code available for payment source | Source: Ambulatory Visit | Attending: Cardiology | Admitting: Cardiology

## 2023-05-02 ENCOUNTER — Ambulatory Visit (HOSPITAL_COMMUNITY): Payer: No Typology Code available for payment source

## 2023-05-02 DIAGNOSIS — Z951 Presence of aortocoronary bypass graft: Secondary | ICD-10-CM | POA: Diagnosis not present

## 2023-05-04 ENCOUNTER — Ambulatory Visit (HOSPITAL_COMMUNITY): Payer: No Typology Code available for payment source

## 2023-05-04 ENCOUNTER — Encounter (HOSPITAL_COMMUNITY)
Admission: RE | Admit: 2023-05-04 | Discharge: 2023-05-04 | Disposition: A | Discharge status: Home / Self Care | Payer: No Typology Code available for payment source | Source: Ambulatory Visit | Attending: Cardiology

## 2023-05-04 DIAGNOSIS — Z951 Presence of aortocoronary bypass graft: Secondary | ICD-10-CM | POA: Diagnosis not present

## 2023-05-04 NOTE — Progress Notes (Signed)
Dhruvan reports still experiencing chest tightness that he shared with staff on 04/16/2023. During cardiac rehab today he shared he experiences the tightness with exercise on recumbent bike and recumbent stepper when trying to increase workloads but not with hand-held weights, not with walking and not at rest or other activity in cardiac rehab. The tightness eases off when decreases pace and or workload.  He shared he also had tightness occur when riding a bike up a hill recently, but it eased off when stopped cycling and did not experience it while not hill climbing. He rates the chest tightness 4/10 on a 0/10 scale. BP check while exercising on the stepper was 140/82, Exit BP post relaxation 114/80. HR with exercise 99 SR with no noted ecg changes.  The area of chest tightness occurs in mid-to upper sternal area about 3 inches to both sides of sternum.  He experiences no other symptoms. He does notice the tightness also while taking deep breaths which he reproduced at cardiac rehab this morning while I had him take a few deep breaths.  The weekend prior to 11/18 he had moved and shared with staff he did lift several boxes.  He has an appointment on Wednesday 05/09/23 with his Cardiologist Dr. Newt Lukes so he will report this to his cardiologist. We will continue to monitor in cardiac rehab.

## 2023-05-07 ENCOUNTER — Ambulatory Visit (HOSPITAL_COMMUNITY): Payer: No Typology Code available for payment source

## 2023-05-07 ENCOUNTER — Encounter (HOSPITAL_COMMUNITY)
Admission: RE | Admit: 2023-05-07 | Discharge: 2023-05-07 | Disposition: A | Discharge status: Home / Self Care | Payer: No Typology Code available for payment source | Source: Ambulatory Visit | Attending: Cardiology

## 2023-05-07 DIAGNOSIS — Z951 Presence of aortocoronary bypass graft: Secondary | ICD-10-CM | POA: Diagnosis not present

## 2023-05-07 NOTE — Progress Notes (Signed)
Cardiac Individual Treatment Plan  Patient Details  Name: Thelmer Klitz MRN: [id number] Date of Birth: Oct 16, 1965 Referring Provider:   Flowsheet Row INTENSIVE CARDIAC REHAB ORIENT from 03/01/2023 in [facility]  Referring Provider Dr. Carleene Cooper, MD (Dr. 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  Patient's Home Medications on Admission:  Current Outpatient Medications:    acetaminophen (TYLENOL) 500 MG tablet, Take 2 tablets (1,000 mg total) by mouth 4 (four) times daily., Disp: 100 tablet, Rfl: 0   amLODipine (NORVASC) 2.5 MG tablet, Take 1 tablet (2.5 mg total) by mouth daily., Disp: 30 tablet, Rfl: 1   cetirizine (ZYRTEC) 10 MG tablet, Take 10 mg by mouth daily., Disp: , Rfl:    chlorzoxazone (PARAFON) 500 MG tablet, Take 500 mg by mouth 3 (three) times daily as needed for muscle spasms., Disp: , Rfl:    clobetasol ointment (TEMOVATE) 0.05 %, Apply 1 Application topically 2 (two) times daily as needed (dermatitis flare)., Disp: , Rfl:    furosemide (LASIX) 40 MG tablet, Take 1 tablet (40 mg total) by mouth daily. (Patient not taking: Reported on 03/01/2023), Disp: 5 tablet, Rfl: 0   latanoprost (XALATAN) 0.005 % ophthalmic solution, Place 1 drop into both eyes at bedtime., Disp: , Rfl:    metoprolol succinate (TOPROL-XL) 50 MG 24 hr tablet, Take 25 mg by mouth daily., Disp: , Rfl:    nitroGLYCERIN (NITROSTAT) 0.3 MG SL tablet, Place 1 tablet (0.3 mg total) under the tongue every 5 (five) minutes as needed for chest pain., Disp: 90 tablet, Rfl: 0   Polyvinyl Alcohol-Povidone (REFRESH OP), Place 1 drop into both eyes daily., Disp: , Rfl:    rosuvastatin (CRESTOR) 40 MG tablet, Take 1 tablet (40 mg total) by mouth daily.,  Disp: 30 tablet, Rfl: 2   Simethicone (GAS-X PO), Take 2 tablets by mouth daily as needed (gas)., Disp: , Rfl:    triamcinolone ointment (KENALOG) 0.1 %, Apply 1 Application topically 2 (two) times daily as needed (dermatitis flare)., Disp: , Rfl:   Past Medical History: Past Medical History:  Diagnosis Date   Allergy    Anginal pain (HCC)    with exertion   Back pain    Coronary artery disease    Fatty liver    GERD (gastroesophageal reflux disease)    Glaucoma    Hypertension    MRSA carrier    Sleep apnea    On CPAP    Tobacco Use: Social History   Tobacco Use  Smoking Status Never  Smokeless Tobacco Never    Labs: Review Flowsheet  More data exists      Latest Ref Rng & Units 09/10/2017 10/31/2017 01/09/2023 01/17/2023 01/18/2023  Labs for ITP Cardiac and Pulmonary Rehab  Cholestrol 0 - 200 mg/dL 409  811  - - 61   LDL (calc) 0 - 99 mg/dL 914  782  - - 26   HDL-C >40 mg/dL 31  38  - - 19   Trlycerides <150 mg/dL 956  213  - - 79   Hemoglobin A1c 4.8 - 5.6 % - 5.5  5.7  - -  PH, Arterial 7.35 - 7.45 - - 7.42  7.301  7.234  7.281  7.275  7.323  7.314  7.317  -  PCO2 arterial 32 - 48 mmHg - - 36  45.9  28.6  42.5  39.6  46.0  38.4  44.4  -  Bicarbonate 20.0 - 28.0 mmol/L - - 23.4  22.4  11.9  20.0  18.4  23.9  19.5  22.7  -  TCO2 22 - 32 mmol/L - - - 24  13  21  20  23  25  24  21  21  22  22  24   -  Acid-base deficit 0.0 - 2.0 mmol/L - - 0.7  4.0  14.0  6.0  8.0  2.0  6.0  4.0  -  O2 Saturation % - - 100  96  97  94  96  99  100  99  -    Details       Multiple values from one day are sorted in reverse-chronological order         Capillary Blood Glucose: Lab Results  Component Value Date   GLUCAP 97 01/23/2023   GLUCAP 113 (H) 01/22/2023   GLUCAP 82 01/22/2023   GLUCAP 86 01/22/2023   GLUCAP 98 01/22/2023     Exercise Target Goals: Exercise Program Goal: Individual exercise prescription set using results from initial 6 min walk test and THRR while  considering  patient's activity barriers and safety.   Exercise Prescription Goal: Starting with aerobic activity 30 plus minutes a day, 3 days per week for initial exercise prescription. Provide home exercise prescription and guidelines that participant acknowledges understanding prior to discharge.  Activity Barriers & Risk Stratification:  Activity Barriers & Cardiac Risk Stratification - 03/01/23 1401       Activity Barriers & Cardiac Risk Stratification   Activity Barriers Deconditioning;Back Problems;Neck/Spine Problems    Cardiac Risk Stratification High   Under 5 MET's on            6 Minute Walk:  6 Minute Walk     Row Name 03/01/23 1359         6 Minute Walk   Phase Initial     Distance 1265 feet     Walk Time 6 minutes     # of Rest Breaks 0     MPH 2.4     METS 3.16     RPE 7     Perceived Dyspnea  0     VO2 Peak 11.06     Symptoms Yes (comment)     Comments incision site pain 2/10, right side of scar, resolved with rest     Resting HR 75 bpm     Resting BP 140/72     Resting Oxygen Saturation  99 %     Exercise Oxygen Saturation  during 6 min walk 100 %     Max Ex. HR 92 bpm     Max Ex. BP 150/84     2 Minute Post BP 148/82              Oxygen Initial Assessment:   Oxygen Re-Evaluation:   Oxygen Discharge (Final Oxygen Re-Evaluation):   Initial Exercise Prescription:  Initial Exercise Prescription - 03/01/23 1400       Date of Initial Exercise RX and Referring Provider   Date 03/01/23    Referring Provider Dr. Carleene Cooper, MD (Dr. Armanda Magic covering)    Expected Discharge Date 05/21/23      Recumbant Bike   Level 2  RPM 60    Watts 15    Minutes 15    METs 1.9      NuStep   Level 2    SPM 80    Minutes 15    METs 2      Prescription Details   Frequency (times per week) 3    Duration Progress to 30 minutes of continuous aerobic without signs/symptoms of physical distress      Intensity   THRR 40-80% of Max  Heartrate 66-132    Ratings of Perceived Exertion 11-13    Perceived Dyspnea 0-4      Progression   Progression Continue progressive overload as per policy without signs/symptoms or physical distress.      Resistance Training   Training Prescription Yes    Weight 3    Reps 10-15             Perform Capillary Blood Glucose checks as needed.  Exercise Prescription Changes:   Exercise Prescription Changes     Row Name 03/05/23 0837 03/19/23 0840 04/02/23 1715 04/16/23 1416 04/30/23 1648     Response to Exercise   Blood Pressure (Admit) 140/82 134/72 122/64 130/70 130/80   Blood Pressure (Exercise) 140/88 136/82 --  Over 10 exercise sessions, no BP 128/80  Over 10 exercise sessions, no BP --   Blood Pressure (Exit) 110/60 128/80 124/82 108/64 124/80   Heart Rate (Admit) 78 bpm 78 bpm 70 bpm 74 bpm 75 bpm   Heart Rate (Exercise) 92 bpm 98 bpm 94 bpm 97 bpm 102 bpm   Heart Rate (Exit) 73 bpm 82 bpm 73 bpm 70 bpm 83 bpm   Rating of Perceived Exertion (Exercise) 10 11 12  12.5 12   Perceived Dyspnea (Exercise) 0 0 0 0 0   Symptoms 0 0 0 0 0   Comments Pt first day in the [employer] program Reviewed MET's, goals and home ExRx REVD MET's REVD MET's and goals REVD MET's   Duration Progress to 30 minutes of  aerobic without signs/symptoms of physical distress Progress to 30 minutes of  aerobic without signs/symptoms of physical distress Progress to 30 minutes of  aerobic without signs/symptoms of physical distress Progress to 30 minutes of  aerobic without signs/symptoms of physical distress Progress to 30 minutes of  aerobic without signs/symptoms of physical distress   Intensity THRR unchanged THRR unchanged THRR unchanged THRR unchanged THRR unchanged     Progression   Progression Continue to progress workloads to maintain intensity without signs/symptoms of physical distress. Continue to progress workloads to maintain intensity without signs/symptoms of physical distress. Continue  to progress workloads to maintain intensity without signs/symptoms of physical distress. Continue to progress workloads to maintain intensity without signs/symptoms of physical distress. Continue to progress workloads to maintain intensity without signs/symptoms of physical distress.   Average METs 1.95 2.6 2.95 3 3.35     Resistance Training   Training Prescription Yes Yes Yes Yes Yes   Weight 3 3 4  lbs wts 4 lbs wts 4 lbs wts   Reps 10-15 10-15 10-15 10-15 10-15   Time 10 Minutes 10 Minutes 10 Minutes 10 Minutes 10 Minutes     Recumbant Bike   Level 2 3 3 3 3    RPM 65 67 74 69 79   Watts 25 34 40 30 46   Minutes 15 15 15 15 15    METs 2.1 2.9 3.3 3 3.4     NuStep   Level 2 2 2  5  5   SPM 70 83 86 84 91   Minutes 15 15 15 15 15    METs 1.8 2.3 2.6 3 3.3     Home Exercise Plan   Plans to continue exercise at -- Home (comment) Home (comment) Home (comment) Home (comment)   Frequency -- Add 3 additional days to program exercise sessions. Add 3 additional days to program exercise sessions. Add 3 additional days to program exercise sessions. Add 3 additional days to program exercise sessions.   Initial Home Exercises Provided -- 03/19/23 03/19/23 03/19/23 03/19/23            Exercise Comments:   Exercise Comments     Row Name 03/05/23 0844 03/19/23 0847 04/02/23 0830 04/16/23 1420     Exercise Comments Pt first day in the [facility] program. Pt tolerated exercise well with an average MET level of 1.95. Pt is learning his THRR, RPE and ExRx. Off to a great start. Reviewed MET's, goals and home ExRx. Pt tolerated exercise well with an average MET level of 2.6. Pt is feeling good about his goals and is increasing strength and stamina. He will continue to exercise on his own by walking, stretching and bike riding for 30-45 mins 2-3 days Reviewed MET's. Pt tolerated exercise well with an average MET level of 2.95. Pt is doing well and progressing his MET's Reviewed MET's and goals. Pt  tolerated exercise well with an average MET level of 3.0. Pt is feeling good about his goals. He has been having some issues with chest pain, maybe muscular, due to moving homes. Nurse aware. Overall pt doing well and progressing MET's             Exercise Goals and Review:   Exercise Goals     Row Name 03/01/23 1409             Exercise Goals   Increase Physical Activity Yes       Intervention Provide advice, education, support and counseling about physical activity/exercise needs.;Develop an individualized exercise prescription for aerobic and resistive training based on initial evaluation findings, risk stratification, comorbidities and participant's personal goals.       Expected Outcomes Short Term: Attend rehab on a regular basis to increase amount of physical activity.;Long Term: Exercising regularly at least 3-5 days a week.;Long Term: Add in home exercise to make exercise part of routine and to increase amount of physical activity.       Increase Strength and Stamina Yes       Intervention Provide advice, education, support and counseling about physical activity/exercise needs.;Develop an individualized exercise prescription for aerobic and resistive training based on initial evaluation findings, risk stratification, comorbidities and participant's personal goals.       Expected Outcomes Short Term: Increase workloads from initial exercise prescription for resistance, speed, and METs.;Short Term: Perform resistance training exercises routinely during rehab and add in resistance training at home;Long Term: Improve cardiorespiratory fitness, muscular endurance and strength as measured by increased METs and functional capacity ( )       Able to understand and use rate of perceived exertion (RPE) scale Yes       Intervention Provide education and explanation on how to use RPE scale       Expected Outcomes Short Term: Able to use RPE daily in rehab to express subjective intensity  level;Long Term:  Able to use RPE to guide intensity level when exercising independently       Knowledge and understanding of Target  Heart Rate Range (THRR) Yes       Intervention Provide education and explanation of THRR including how the numbers were predicted and where they are located for reference       Expected Outcomes Short Term: Able to state/look up THRR;Long Term: Able to use THRR to govern intensity when exercising independently;Short Term: Able to use daily as guideline for intensity in rehab       Understanding of Exercise Prescription Yes       Intervention Provide education, explanation, and written materials on patient's individual exercise prescription       Expected Outcomes Short Term: Able to explain program exercise prescription;Long Term: Able to explain home exercise prescription to exercise independently                Exercise Goals Re-Evaluation :  Exercise Goals Re-Evaluation     Row Name 03/05/23 0842 03/19/23 0843 04/16/23 1418 04/30/23 1650       Exercise Goal Re-Evaluation   Exercise Goals Review Increase Physical Activity;Understanding of Exercise Prescription;Increase Strength and Stamina;Knowledge and understanding of Target Heart Rate Range (THRR);Able to understand and use rate of perceived exertion (RPE) scale Increase Physical Activity;Understanding of Exercise Prescription;Increase Strength and Stamina;Knowledge and understanding of Target Heart Rate Range (THRR);Able to understand and use rate of perceived exertion (RPE) scale Increase Physical Activity;Understanding of Exercise Prescription;Increase Strength and Stamina;Knowledge and understanding of Target Heart Rate Range (THRR);Able to understand and use rate of perceived exertion (RPE) scale Increase Physical Activity;Understanding of Exercise Prescription;Increase Strength and Stamina;Knowledge and understanding of Target Heart Rate Range (THRR);Able to understand and use rate of perceived exertion  (RPE) scale    Comments Pt first day in the [facility] program. Pt tolerated exercise well with an average MET level of 1.95. Pt is learning his THRR, RPE and ExRx. Off to a great start. Reviewed MET's, goals and home ExRx. Pt tolerated exercise well with an average MET level of 2.6. Pt is feeling good about his goals and is increasing strength and stamina. He will continue to exercise on his own by walking, stretching and bike riding for 30-45 mins 2-3 days Reviewed MET's and goals. Pt tolerated exercise well with an average MET level of 3.0. Pt is feeling good about his goals. He has been having some issues with chest pain, maybe muscular, due to moving homes. Nurse aware. Overall pt doing well and progressing MET's Reviewed MET's. Pt tolerated exercise well with an average MET level of 3.35. Pt is doing well and progressing MET's    Expected Outcomes Will continue to monitor pt and progress workloads as tolerated without sign or symptom Will continue to monitor pt and progress workloads as tolerated without sign or symptom Will continue to monitor pt and progress workloads as tolerated without sign or symptom Will continue to monitor pt and progress workloads as tolerated without sign or symptom              Discharge Exercise Prescription (Final Exercise Prescription Changes):  Exercise Prescription Changes - 04/30/23 1648       Response to Exercise   Blood Pressure (Admit) 130/80    Blood Pressure (Exit) 124/80    Heart Rate (Admit) 75 bpm    Heart Rate (Exercise) 102 bpm    Heart Rate (Exit) 83 bpm    Rating of Perceived Exertion (Exercise) 12    Perceived Dyspnea (Exercise) 0    Symptoms 0    Comments REVD MET's    Duration Progress  to 30 minutes of  aerobic without signs/symptoms of physical distress    Intensity THRR unchanged      Progression   Progression Continue to progress workloads to maintain intensity without signs/symptoms of physical distress.    Average METs 3.35       Resistance Training   Training Prescription Yes    Weight 4 lbs wts    Reps 10-15    Time 10 Minutes      Recumbant Bike   Level 3    RPM 79    Watts 46    Minutes 15    METs 3.4      NuStep   Level 5    SPM 91    Minutes 15    METs 3.3      Home Exercise Plan   Plans to continue exercise at Home (comment)    Frequency Add 3 additional days to program exercise sessions.    Initial Home Exercises Provided 03/19/23             Nutrition:  Target Goals: Understanding of nutrition guidelines, daily intake of sodium 1500mg , cholesterol 200mg , calories 30% from fat and 7% or less from saturated fats, daily to have 5 or more servings of fruits and vegetables.  Biometrics:  Pre Biometrics - 03/01/23 1410       Pre Biometrics   Height 5\' 5"  (1.651 m)    Weight 100.6 kg    Waist Circumference 46.75 inches    Hip Circumference 41 inches    Waist to Hip Ratio 1.14 %    BMI (Calculated) 36.91    Triceps Skinfold 12 mm    % Body Fat 33.3 %    Grip Strength 42 kg    Flexibility 5 in    Single Leg Stand 30 seconds              Nutrition Therapy Plan and Nutrition Goals:  Nutrition Therapy & Goals - 04/06/23 0912       Nutrition Therapy   Diet Heart Healthy Diet    Drug/Food Interactions Statins/Certain Fruits      Personal Nutrition Goals   Nutrition Goal Patient to identify strategies for reducing cardiovascular risk by attending the [facility] education and nutrition series weekly.   goal not met.   Personal Goal #2 Patient to improve diet quality by using the plate method as a guide for meal planning to include lean protein/plant protein, fruits, vegetables, whole grains, nonfat dairy as part of a well-balanced diet.   goal in progress.   Comments Goal in progress. However, Kamerin does not attend the [facility] education and nutrition series. Aansh has medical history of CABGx4, HTN, CAD, OSA. His cholesterol is well controlled. His A1c remains in a  pre-diabetic range. He is up 4.4# since starting with our program. Patient will benefit from participation in intensive cardiac rehab for nutrition, exercise, and lifestyle modification.      Intervention Plan   Intervention Prescribe, educate and counsel regarding individualized specific dietary modifications aiming towards targeted core components such as weight, hypertension, lipid management, diabetes, heart failure and other comorbidities.;Nutrition handout(s) given to patient.    Expected Outcomes Short Term Goal: Understand basic principles of dietary content, such as calories, fat, sodium, cholesterol and nutrients.;Long Term Goal: Adherence to prescribed nutrition plan.             Nutrition Assessments:  Nutrition Assessments - 03/05/23 1126       Rate Your Plate Scores  Pre Score 60            MEDIFICTS Score Key: >=70 Need to make dietary changes  40-70 Heart Healthy Diet <= 40 Therapeutic Level Cholesterol Diet  Flowsheet Row INTENSIVE CARDIAC REHAB from 03/05/2023 in [facility]  Picture Your Plate Total Score on Admission 60      Picture Your Plate Scores: <16 Unhealthy dietary pattern with much room for improvement. 41-50 Dietary pattern unlikely to meet recommendations for good health and room for improvement. 51-60 More healthful dietary pattern, with some room for improvement.  >60 Healthy dietary pattern, although there may be some specific behaviors that could be improved.    Nutrition Goals Re-Evaluation:  Nutrition Goals Re-Evaluation     Row Name 03/05/23 0956 04/06/23 0912           Goals   Current Weight 222 lb 10.6 oz (101 kg) 226 lb 3.1 oz (102.6 kg)      Comment HDL 19, LDL 26, A1c 5.7 no new labs; most recent labs  HDL 19, LDL 26, A1c 5.7      Expected Outcome Palash has medical history of CABGx4, HTN, CAD, OSA. His cholesterol is well controlled. His A1c remains in a  pre-diabetic range. Patient will benefit from participation in intensive cardiac rehab for nutrition, exercise, and lifestyle modification. Goal in progress. However, Amardeep does not attend the [facility] education and nutrition series. [place] has medical history of CABGx4, HTN, CAD, OSA. His cholesterol is well controlled. His A1c remains in a pre-diabetic range. He is up 4.4# since starting with our program. Patient will benefit from participation in intensive cardiac rehab for nutrition, exercise, and lifestyle modification.               Nutrition Goals Discharge (Final Nutrition Goals Re-Evaluation):  Nutrition Goals Re-Evaluation - 04/06/23 0912       Goals   Current Weight 226 lb 3.1 oz (102.6 kg)    Comment no new labs; most recent labs  HDL 19, LDL 26, A1c 5.7    Expected Outcome Goal in progress. However, Letrell does not attend the [facility] education and nutrition series. Nuncio has medical history of CABGx4, HTN, CAD, OSA. His cholesterol is well controlled. His A1c remains in a pre-diabetic range. He is up 4.4# since starting with our program. Patient will benefit from participation in intensive cardiac rehab for nutrition, exercise, and lifestyle modification.             Psychosocial: Target Goals: Acknowledge presence or absence of significant depression and/or stress, maximize coping skills, provide positive support system. Participant is able to verbalize types and ability to use techniques and skills needed for reducing stress and depression.  Initial Review & Psychosocial Screening:  Initial Psych Review & Screening - 03/01/23 1412       Initial Review   Current issues with Current Stress Concerns   some mild stress with his health and his wifes health concerns. They are also planning a move. Does not feel the need for any additional resources at this time.   Source of Stress Concerns Family      Family Dynamics   Good Support System? Yes   Wife is good support      Barriers   Psychosocial barriers to participate in program The patient should benefit from training in stress management and relaxation.      Screening Interventions   Interventions Encouraged to exercise;Provide feedback about  the scores to participant    Expected Outcomes Long Term Goal: Stressors or current issues are controlled or eliminated.;Short Term goal: Identification and review with participant of any Quality of Life or Depression concerns found by scoring the questionnaire.;Long Term goal: The participant improves quality of Life and PHQ9 Scores as seen by post scores and/or verbalization of changes             Quality of Life Scores:  Quality of Life - 03/01/23 1415       Quality of Life   Select Quality of Life      Quality of Life Scores   Health/Function Pre 13.93 %    Socioeconomic Pre 26.67 %    Psych/Spiritual Pre 20 %    Family Pre 20.4 %    GLOBAL Pre 18.52 %            Scores of 19 and below usually indicate a poorer quality of life in these areas.  A difference of  2-3 points is a clinically meaningful difference.  A difference of 2-3 points in the total score of the Quality of Life Index has been associated with significant improvement in overall quality of life, self-image, physical symptoms, and general health in studies assessing change in quality of life.  PHQ-9: Review Flowsheet  More data exists      03/01/2023 05/06/2018 03/14/2018 10/31/2017 09/10/2017  Depression screen PHQ 2/9  Decreased Interest 0 0 0 0 0  Down, Depressed, Hopeless 0 0 0 0 0  PHQ - 2 Score 0 0 0 0 0  Altered sleeping 0 - - - -  Tired, decreased energy 0 - - - -  Change in appetite 0 - - - -  Feeling bad or failure about yourself  0 - - - -  Trouble concentrating 0 - - - -  Moving slowly or fidgety/restless 0 - - - -  Suicidal thoughts 0 - - - -  PHQ-9 Score 0 - - - -    Details           Interpretation of Total Score  Total Score Depression Severity:  1-4 =  Minimal depression, 5-9 = Mild depression, 10-14 = Moderate depression, 15-19 = Moderately severe depression, 20-27 = Severe depression   Psychosocial Evaluation and Intervention:   Psychosocial Re-Evaluation:  Psychosocial Re-Evaluation     Row Name 03/15/23 0956 05/01/23 1419           Psychosocial Re-Evaluation   Current issues with Current Stress Concerns Current Stress Concerns      Comments Kahle started cardiac rehab on 03/05/23. Coery has not voiced any increased concersn or stressors during exercise. Will review quality of life questionnaire in the upcoming week Emrys  has not voiced any increased concerns  or stressors during exercise. Quality of life questionnaire reviewed on 03/19/23.      Expected Outcomes Adolphus will have controlled or decreased stress upon completing cardiac rehab Britton will have controlled or decreased stress upon completing cardiac rehab      Interventions Encouraged to attend Cardiac Rehabilitation for the exercise;Relaxation education;Encouraged to attend Pulmonary Rehabilitation for the exercise Encouraged to attend Cardiac Rehabilitation for the exercise;Relaxation education;Encouraged to attend Pulmonary Rehabilitation for the exercise      Continue Psychosocial Services  Follow up required by staff No Follow up required        Initial Review   Source of Stress Concerns Family Family      Comments will continue to monitor  and offer support as needed will continue to monitor and offer support as needed               Psychosocial Discharge (Final Psychosocial Re-Evaluation):  Psychosocial Re-Evaluation - 05/01/23 1419       Psychosocial Re-Evaluation   Current issues with Current Stress Concerns    Comments Sigifredo  has not voiced any increased concerns  or stressors during exercise. Quality of life questionnaire reviewed on 03/19/23.    Expected Outcomes Arieon will have controlled or decreased stress upon completing cardiac rehab     Interventions Encouraged to attend Cardiac Rehabilitation for the exercise;Relaxation education;Encouraged to attend Pulmonary Rehabilitation for the exercise    Continue Psychosocial Services  No Follow up required      Initial Review   Source of Stress Concerns Family    Comments will continue to monitor and offer support as needed             Vocational Rehabilitation: Provide vocational rehab assistance to qualifying candidates.   Vocational Rehab Evaluation & Intervention:  Vocational Rehab - 03/01/23 1420       Initial Vocational Rehab Evaluation & Intervention   Assessment shows need for Vocational Rehabilitation No   No needs at this time            Education: Education Goals: Education classes will be provided on a weekly basis, covering required topics. Participant will state understanding/return demonstration of topics presented.  Learning Barriers/Preferences:  Learning Barriers/Preferences - 03/01/23 1416       Learning Barriers/Preferences   Learning Barriers None    Learning Preferences Group Instruction;Individual Instruction;Pictoral;Video;Written Material;Skilled Demonstration;Computer/Internet             Education Topics: Hypertension, Hypertension Reduction -Define heart disease and high blood pressure. Discus how high blood pressure affects the body and ways to reduce high blood pressure.   Exercise and Your Heart -Discuss why it is important to exercise, the FITT principles of exercise, normal and abnormal responses to exercise, and how to exercise safely.   Angina -Discuss definition of angina, causes of angina, treatment of angina, and how to decrease risk of having angina.   Cardiac Medications -Review what the following cardiac medications are used for, how they affect the body, and side effects that may occur when taking the medications.  Medications include Aspirin, Beta blockers, calcium channel blockers, ACE Inhibitors,  angiotensin receptor blockers, diuretics, digoxin, and antihyperlipidemics.   Congestive Heart Failure -Discuss the definition of CHF, how to live with CHF, the signs and symptoms of CHF, and how keep track of weight and sodium intake.   Heart Disease and Intimacy -Discus the effect sexual activity has on the heart, how changes occur during intimacy as we age, and safety during sexual activity.   Smoking Cessation / COPD -Discuss different methods to quit smoking, the health benefits of quitting smoking, and the definition of COPD.   Nutrition I: Fats -Discuss the types of cholesterol, what cholesterol does to the heart, and how cholesterol levels can be controlled.   Nutrition II: Labels -Discuss the different components of food labels and how to read food label   Heart Parts/Heart Disease and PAD -Discuss the anatomy of the heart, the pathway of blood circulation through the heart, and these are affected by heart disease.   Stress I: Signs and Symptoms -Discuss the causes of stress, how stress may lead to anxiety and depression, and ways to limit stress.   Stress II: Relaxation -Discuss different  types of relaxation techniques to limit stress.   Warning Signs of Stroke / TIA -Discuss definition of a stroke, what the signs and symptoms are of a stroke, and how to identify when someone is having stroke.   Knowledge Questionnaire Score:  Knowledge Questionnaire Score - 03/01/23 1416       Knowledge Questionnaire Score   Pre Score 19/24             Core Components/Risk Factors/Patient Goals at Admission:  Personal Goals and Risk Factors at Admission - 03/01/23 1422       Core Components/Risk Factors/Patient Goals on Admission    Weight Management Yes;Obesity;Weight Loss    Intervention Weight Management: Develop a combined nutrition and exercise program designed to reach desired caloric intake, while maintaining appropriate intake of nutrient and fiber, sodium  and fats, and appropriate energy expenditure required for the weight goal.;Weight Management: Provide education and appropriate resources to help participant work on and attain dietary goals.;Weight Management/Obesity: Establish reasonable short term and long term weight goals.;Obesity: Provide education and appropriate resources to help participant work on and attain dietary goals.    Admit Weight 220 lb 14.4 oz (100.2 kg)    Expected Outcomes Short Term: Continue to assess and modify interventions until short term weight is achieved;Long Term: Adherence to nutrition and physical activity/exercise program aimed toward attainment of established weight goal;Weight Loss: Understanding of general recommendations for a balanced deficit meal plan, which promotes 1-2 lb weight loss per week and includes a negative energy balance of [phone] kcal/d;Understanding recommendations for meals to include 15-35% energy as protein, 25-35% energy from fat, 35-60% energy from carbohydrates, less than 200mg  of dietary cholesterol, 20-35 gm of total fiber daily;Understanding of distribution of calorie intake throughout the day with the consumption of 4-5 meals/snacks    Hypertension Yes    Intervention Provide education on lifestyle modifcations including regular physical activity/exercise, weight management, moderate sodium restriction and increased consumption of fresh fruit, vegetables, and low fat dairy, alcohol moderation, and smoking cessation.;Monitor prescription use compliance.    Expected Outcomes Short Term: Continued assessment and intervention until BP is < 140/85mm HG in hypertensive participants. < 130/24mm HG in hypertensive participants with diabetes, heart failure or chronic kidney disease.;Long Term: Maintenance of blood pressure at goal levels.    Lipids Yes    Intervention Provide education and support for participant on nutrition & aerobic/resistive exercise along with prescribed medications to achieve LDL  70mg , HDL >40mg .    Expected Outcomes Short Term: Participant states understanding of desired cholesterol values and is compliant with medications prescribed. Participant is following exercise prescription and nutrition guidelines.;Long Term: Cholesterol controlled with medications as prescribed, with individualized exercise RX and with personalized nutrition plan. Value goals: LDL < 70mg , HDL > 40 mg.    Stress Yes    Intervention Offer individual and/or small group education and counseling on adjustment to heart disease, stress management and health-related lifestyle change. Teach and support self-help strategies.;Refer participants experiencing significant psychosocial distress to appropriate mental health specialists for further evaluation and treatment. When possible, include family members and significant others in education/counseling sessions.    Expected Outcomes Short Term: Participant demonstrates changes in health-related behavior, relaxation and other stress management skills, ability to obtain effective social support, and compliance with psychotropic medications if prescribed.;Long Term: Emotional wellbeing is indicated by absence of clinically significant psychosocial distress or social isolation.    Personal Goal Other Yes    Personal Goal Pt would like to get back to  normal, and have long life, know more about sternal precautions    Intervention Will continue to monitor pt and progress workloads as tolerated without sign or symptom    Expected Outcomes Pt will achieve his goals             Core Components/Risk Factors/Patient Goals Review:   Goals and Risk Factor Review     Row Name 03/15/23 0959 04/16/23 1717 05/01/23 1421         Core Components/Risk Factors/Patient Goals Review   Personal Goals Review Weight Management/Obesity;Hypertension;Lipids;Stress Weight Management/Obesity;Hypertension;Lipids;Stress Weight Management/Obesity;Hypertension;Lipids;Stress     Review  Zyad started cardiac rehab on 03/05/23 and is off to a good start to exercise. Vital signs have been stable. HDL 19, other Lipid values WNL Tra continues to do well with exercise at cardiac rehab. Vital signs remain stable. Mclane continues to do well with exercise at cardiac rehab. Vital signs remain stable. Kiro has gained 2.4 kg since starting cardiac rehab.     Expected Outcomes Coery will continue to participate in cardiac rehab for exercise, nutrition and lifestyle modifications Coery will continue to participate in cardiac rehab for exercise, nutrition and lifestyle modifications Coery will continue to participate in cardiac rehab for exercise, nutrition and lifestyle modifications              Core Components/Risk Factors/Patient Goals at Discharge (Final Review):   Goals and Risk Factor Review - 05/01/23 1421       Core Components/Risk Factors/Patient Goals Review   Personal Goals Review Weight Management/Obesity;Hypertension;Lipids;Stress    Review Lejuan continues to do well with exercise at cardiac rehab. Vital signs remain stable. Adonijah has gained 2.4 kg since starting cardiac rehab.    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 Introduction to [facility] education / intensive cardiac rehab. Initial orientation packet reviewed with patient. 30 Day ITP Review. Siva started cardiac rehab on 03/05/23 and is off to a good start to exercise. 30 Day ITP Review. Mondell has good attendance and participation with exercise at cardiac rehab. 30 Day ITP Review. Jaevin continues to have good attendance and participation in cardiac rehab.             Comments: See ITP comment

## 2023-05-09 ENCOUNTER — Encounter (HOSPITAL_COMMUNITY)
Admission: RE | Admit: 2023-05-09 | Discharge: 2023-05-09 | Disposition: A | Discharge status: Home / Self Care | Payer: No Typology Code available for payment source | Source: Ambulatory Visit | Attending: Cardiology | Admitting: Cardiology

## 2023-05-09 ENCOUNTER — Ambulatory Visit (HOSPITAL_COMMUNITY): Payer: No Typology Code available for payment source

## 2023-05-09 DIAGNOSIS — Z951 Presence of aortocoronary bypass graft: Secondary | ICD-10-CM

## 2023-05-11 ENCOUNTER — Encounter (HOSPITAL_COMMUNITY)
Admission: RE | Admit: 2023-05-11 | Discharge: 2023-05-11 | Disposition: A | Discharge status: Home / Self Care | Payer: No Typology Code available for payment source | Source: Ambulatory Visit | Attending: Cardiology

## 2023-05-11 ENCOUNTER — Ambulatory Visit (HOSPITAL_COMMUNITY): Payer: No Typology Code available for payment source

## 2023-05-11 DIAGNOSIS — Z951 Presence of aortocoronary bypass graft: Secondary | ICD-10-CM | POA: Diagnosis not present

## 2023-05-14 ENCOUNTER — Ambulatory Visit (HOSPITAL_COMMUNITY): Payer: No Typology Code available for payment source

## 2023-05-14 ENCOUNTER — Encounter (HOSPITAL_COMMUNITY)
Admission: RE | Admit: 2023-05-14 | Discharge: 2023-05-14 | Disposition: A | Discharge status: Home / Self Care | Payer: No Typology Code available for payment source | Source: Ambulatory Visit | Attending: Cardiology

## 2023-05-14 DIAGNOSIS — Z951 Presence of aortocoronary bypass graft: Secondary | ICD-10-CM | POA: Diagnosis not present

## 2023-05-16 ENCOUNTER — Ambulatory Visit (HOSPITAL_COMMUNITY): Payer: No Typology Code available for payment source

## 2023-05-16 ENCOUNTER — Encounter (HOSPITAL_COMMUNITY): Payer: No Typology Code available for payment source

## 2023-05-18 ENCOUNTER — Encounter (HOSPITAL_COMMUNITY)
Admission: RE | Admit: 2023-05-18 | Discharge: 2023-05-18 | Disposition: A | Discharge status: Home / Self Care | Payer: No Typology Code available for payment source | Source: Ambulatory Visit | Attending: Cardiology | Admitting: Cardiology

## 2023-05-18 ENCOUNTER — Ambulatory Visit (HOSPITAL_COMMUNITY): Payer: No Typology Code available for payment source

## 2023-05-18 DIAGNOSIS — Z951 Presence of aortocoronary bypass graft: Secondary | ICD-10-CM | POA: Diagnosis not present

## 2023-05-21 ENCOUNTER — Encounter (HOSPITAL_COMMUNITY)
Admission: RE | Admit: 2023-05-21 | Discharge: 2023-05-21 | Disposition: A | Discharge status: Home / Self Care | Payer: No Typology Code available for payment source | Source: Ambulatory Visit | Attending: Cardiology | Admitting: Cardiology

## 2023-05-21 ENCOUNTER — Ambulatory Visit (HOSPITAL_COMMUNITY): Payer: No Typology Code available for payment source

## 2023-05-21 DIAGNOSIS — Z951 Presence of aortocoronary bypass graft: Secondary | ICD-10-CM

## 2023-05-25 ENCOUNTER — Encounter (HOSPITAL_COMMUNITY)
Admission: RE | Admit: 2023-05-25 | Discharge: 2023-05-25 | Disposition: A | Discharge status: Home / Self Care | Payer: No Typology Code available for payment source | Source: Ambulatory Visit | Attending: Cardiology

## 2023-05-25 DIAGNOSIS — Z951 Presence of aortocoronary bypass graft: Secondary | ICD-10-CM

## 2023-05-28 ENCOUNTER — Encounter (HOSPITAL_COMMUNITY)
Admission: RE | Admit: 2023-05-28 | Discharge: 2023-05-28 | Disposition: A | Discharge status: Home / Self Care | Payer: No Typology Code available for payment source | Source: Ambulatory Visit | Attending: Cardiology | Admitting: Cardiology

## 2023-05-28 DIAGNOSIS — Z951 Presence of aortocoronary bypass graft: Secondary | ICD-10-CM

## 2023-06-01 ENCOUNTER — Encounter (HOSPITAL_COMMUNITY)
Admission: RE | Admit: 2023-06-01 | Discharge: 2023-06-01 | Disposition: A | Discharge status: Home / Self Care | Payer: No Typology Code available for payment source | Source: Ambulatory Visit | Attending: Cardiology | Admitting: Cardiology

## 2023-06-01 VITALS — Ht 65.0 in | Wt 231.5 lb

## 2023-06-01 DIAGNOSIS — Z951 Presence of aortocoronary bypass graft: Secondary | ICD-10-CM | POA: Diagnosis present

## 2023-06-04 ENCOUNTER — Encounter (HOSPITAL_COMMUNITY): Payer: No Typology Code available for payment source

## 2023-06-04 NOTE — Progress Notes (Signed)
 Cardiac Individual Treatment Plan  Patient Details  Name: Arlyn Buerkle MRN: [id number] Date of Birth: Jan 29, 1966 Referring Provider:   Flowsheet Row INTENSIVE CARDIAC REHAB ORIENT from 03/01/2023 in [facility] for Heart, Vascular, & Lung Health  Referring Provider Dr. Ripley Borg, MD (Dr. 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  Patient's Home Medications on Admission:  Current Outpatient Medications:    acetaminophen  (TYLENOL ) 500 MG tablet, Take 2 tablets (1,000 mg total) by mouth 4 (four) times daily., Disp: 100 tablet, Rfl: 0   amLODipine  (NORVASC ) 2.5 MG tablet, Take 1 tablet (2.5 mg total) by mouth daily., Disp: 30 tablet, Rfl: 1   cetirizine (ZYRTEC) 10 MG tablet, Take 10 mg by mouth daily., Disp: , Rfl:    chlorzoxazone (PARAFON) 500 MG tablet, Take 500 mg by mouth 3 (three) times daily as needed for muscle spasms., Disp: , Rfl:    clobetasol ointment (TEMOVATE) 0.05 %, Apply 1 Application topically 2 (two) times daily as needed (dermatitis flare)., Disp: , Rfl:    furosemide  (LASIX ) 40 MG tablet, Take 1 tablet (40 mg total) by mouth daily. (Patient not taking: Reported on 03/01/2023), Disp: 5 tablet, Rfl: 0   latanoprost  (XALATAN ) 0.005 % ophthalmic solution, Place 1 drop into both eyes at bedtime., Disp: , Rfl:    metoprolol  succinate (TOPROL -XL) 50 MG 24 hr tablet, Take 25 mg by mouth daily., Disp: , Rfl:    nitroGLYCERIN  (NITROSTAT ) 0.3 MG SL tablet, Place 1 tablet (0.3 mg total) under the tongue every 5 (five) minutes as needed for chest pain., Disp: 90 tablet, Rfl: 0   Polyvinyl Alcohol -Povidone (REFRESH OP), Place 1 drop into both eyes daily., Disp: , Rfl:    rosuvastatin  (CRESTOR ) 40 MG tablet, Take 1 tablet (40 mg total) by mouth daily.,  Disp: 30 tablet, Rfl: 2   Simethicone  (GAS-X PO), Take 2 tablets by mouth daily as needed (gas)., Disp: , Rfl:    triamcinolone  ointment (KENALOG ) 0.1 %, Apply 1 Application topically 2 (two) times daily as needed (dermatitis flare)., Disp: , Rfl:   Past Medical History: Past Medical History:  Diagnosis Date   Allergy    Anginal pain (HCC)    with exertion   Back pain    Coronary artery disease    Fatty liver    GERD (gastroesophageal reflux disease)    Glaucoma    Hypertension    MRSA carrier    Sleep apnea    On CPAP    Tobacco Use: Social History   Tobacco Use  Smoking Status Never  Smokeless Tobacco Never    Labs: Review Flowsheet  More data exists      Latest Ref Rng & Units 09/10/2017 10/31/2017 01/09/2023 01/17/2023 01/18/2023  Labs for ITP Cardiac and Pulmonary Rehab  Cholestrol 0 - 200 mg/dL 762  785  - - 61   LDL (calc) 0 - 99 mg/dL 845  856  - - 26   HDL-C >40 mg/dL 31  38  - - 19   Trlycerides <150 mg/dL 738  834  - - 79   Hemoglobin A1c 4.8 - 5.6 % - 5.5  5.7  - -  PH, Arterial 7.35 - 7.45 - - 7.42  7.301  7.234  7.281  7.275  7.323  7.314  7.317  -  PCO2 arterial 32 - 48 mmHg - - 36  45.9  28.6  42.5  39.6  46.0  38.4  44.4  -  Bicarbonate 20.0 - 28.0 mmol/L - - 23.4  22.4  11.9  20.0  18.4  23.9  19.5  22.7  -  TCO2 22 - 32 mmol/L - - - 24  13  21  20  23  25  24  21  21  22  22  24   -  Acid-base deficit 0.0 - 2.0 mmol/L - - 0.7  4.0  14.0  6.0  8.0  2.0  6.0  4.0  -  O2 Saturation % - - 100  96  97  94  96  99  100  99  -    Details       Multiple values from one day are sorted in reverse-chronological order         Capillary Blood Glucose: Lab Results  Component Value Date   GLUCAP 97 01/23/2023   GLUCAP 113 (H) 01/22/2023   GLUCAP 82 01/22/2023   GLUCAP 86 01/22/2023   GLUCAP 98 01/22/2023     Exercise Target Goals: Exercise Program Goal: Individual exercise prescription set using results from initial 6 min walk test and THRR while  considering  patient's activity barriers and safety.   Exercise Prescription Goal: Initial exercise prescription builds to 30-45 minutes a day of aerobic activity, 2-3 days per week.  Home exercise guidelines will be given to patient during program as part of exercise prescription that the participant will acknowledge.  Activity Barriers & Risk Stratification:  Activity Barriers & Cardiac Risk Stratification - 03/01/23 1401       Activity Barriers & Cardiac Risk Stratification   Activity Barriers Deconditioning;Back Problems;Neck/Spine Problems    Cardiac Risk Stratification High   Under 5 MET's on            6 Minute Walk:  6 Minute Walk     Row Name 03/01/23 1359 06/01/23 0829       6 Minute Walk   Phase Initial Discharge    Distance 1265 feet 1515 feet    Distance % Change -- 19.76 %    Distance Feet Change -- 250 ft    Walk Time 6 minutes 6 minutes    # of Rest Breaks 0 0    MPH 2.4 2.87    METS 3.16 3.44    RPE 7 11    Perceived Dyspnea  0 0    VO2 Peak 11.06 12.03    Symptoms Yes (comment) Yes (comment)    Comments incision site pain 2/10, right side of scar, resolved with rest Chest muscle pain, incision pain chronic, resolved with rest    Resting HR 75 bpm 62 bpm    Resting BP 140/72 132/78    Resting Oxygen Saturation  99 % --    Exercise Oxygen Saturation  during 6 min walk 100 % --    Max Ex. HR 92 bpm 92 bpm    Max Ex. BP 150/84 140/80    2 Minute Post BP 148/82 --             Oxygen Initial Assessment:   Oxygen Re-Evaluation:   Oxygen Discharge (Final Oxygen Re-Evaluation):   Initial Exercise Prescription:  Initial Exercise Prescription - 03/01/23 1400       Date of Initial Exercise RX and Referring Provider  Date 03/01/23    Referring Provider Dr. Ripley Borg, MD (Dr. Wilbert Bihari covering)    Expected Discharge Date 05/21/23      Recumbant Bike   Level 2    RPM 60    Watts 15    Minutes 15    METs 1.9      NuStep    Level 2    SPM 80    Minutes 15    METs 2      Prescription Details   Frequency (times per week) 3    Duration Progress to 30 minutes of continuous aerobic without signs/symptoms of physical distress      Intensity   THRR 40-80% of Max Heartrate 66-132    Ratings of Perceived Exertion 11-13    Perceived Dyspnea 0-4      Progression   Progression Continue progressive overload as per policy without signs/symptoms or physical distress.      Resistance Training   Training Prescription Yes    Weight 3    Reps 10-15             Perform Capillary Blood Glucose checks as needed.  Exercise Prescription Changes:   Exercise Prescription Changes     Row Name 03/05/23 0837 03/19/23 0840 04/02/23 1715 04/16/23 1416 04/30/23 1648     Response to Exercise   Blood Pressure (Admit) 140/82 134/72 122/64 130/70 130/80   Blood Pressure (Exercise) 140/88 136/82 --  Over 10 exercise sessions, no BP 128/80  Over 10 exercise sessions, no BP --   Blood Pressure (Exit) 110/60 128/80 124/82 108/64 124/80   Heart Rate (Admit) 78 bpm 78 bpm 70 bpm 74 bpm 75 bpm   Heart Rate (Exercise) 92 bpm 98 bpm 94 bpm 97 bpm 102 bpm   Heart Rate (Exit) 73 bpm 82 bpm 73 bpm 70 bpm 83 bpm   Rating of Perceived Exertion (Exercise) 10 11 12  12.5 12   Perceived Dyspnea (Exercise) 0 0 0 0 0   Symptoms 0 0 0 0 0   Comments Pt first day in the [employer] program Reviewed MET's, goals and home ExRx REVD MET's REVD MET's and goals REVD MET's   Duration Progress to 30 minutes of  aerobic without signs/symptoms of physical distress Progress to 30 minutes of  aerobic without signs/symptoms of physical distress Progress to 30 minutes of  aerobic without signs/symptoms of physical distress Progress to 30 minutes of  aerobic without signs/symptoms of physical distress Progress to 30 minutes of  aerobic without signs/symptoms of physical distress   Intensity THRR unchanged THRR unchanged THRR unchanged THRR unchanged THRR  unchanged     Progression   Progression Continue to progress workloads to maintain intensity without signs/symptoms of physical distress. Continue to progress workloads to maintain intensity without signs/symptoms of physical distress. Continue to progress workloads to maintain intensity without signs/symptoms of physical distress. Continue to progress workloads to maintain intensity without signs/symptoms of physical distress. Continue to progress workloads to maintain intensity without signs/symptoms of physical distress.   Average METs 1.95 2.6 2.95 3 3.35     Resistance Training   Training Prescription Yes Yes Yes Yes Yes   Weight 3 3 4  lbs wts 4 lbs wts 4 lbs wts   Reps 10-15 10-15 10-15 10-15 10-15   Time 10 Minutes 10 Minutes 10 Minutes 10 Minutes 10 Minutes     Recumbant Bike   Level 2 3 3 3 3    RPM 65 67 74 69 79  Watts 25 34 40 30 46   Minutes 15 15 15 15 15    METs 2.1 2.9 3.3 3 3.4     NuStep   Level 2 2 2 5 5    SPM 70 83 86 84 91   Minutes 15 15 15 15 15    METs 1.8 2.3 2.6 3 3.3     Home Exercise Plan   Plans to continue exercise at -- Home (comment) Home (comment) Home (comment) Home (comment)   Frequency -- Add 3 additional days to program exercise sessions. Add 3 additional days to program exercise sessions. Add 3 additional days to program exercise sessions. Add 3 additional days to program exercise sessions.   Initial Home Exercises Provided -- 03/19/23 03/19/23 03/19/23 03/19/23    Row Name 05/21/23 1006             Response to Exercise   Blood Pressure (Admit) 130/74       Blood Pressure (Exit) 120/66       Heart Rate (Admit) 65 bpm       Heart Rate (Exercise) 108 bpm       Heart Rate (Exit) 74 bpm       Rating of Perceived Exertion (Exercise) 11       Perceived Dyspnea (Exercise) 0       Symptoms 0       Comments REVD MET's and goals       Duration Progress to 30 minutes of  aerobic without signs/symptoms of physical distress       Intensity THRR  unchanged         Progression   Progression Continue to progress workloads to maintain intensity without signs/symptoms of physical distress.       Average METs 3.55         Resistance Training   Training Prescription Yes       Weight 4 lbs wts       Reps 10-15       Time 10 Minutes         Recumbant Bike   Level 3       RPM 79       Watts 48       Minutes 15       METs 3.5         NuStep   Level 5       SPM 97       Minutes 15       METs 3.6         Home Exercise Plan   Plans to continue exercise at Home (comment)       Frequency Add 3 additional days to program exercise sessions.       Initial Home Exercises Provided 03/19/23                Exercise Comments:   Exercise Comments     Row Name 03/05/23 0844 03/19/23 0847 04/02/23 0830 04/16/23 1420 05/21/23 1010   Exercise Comments Pt first day in the [facility] program. Pt tolerated exercise well with an average MET level of 1.95. Pt is learning his THRR, RPE and ExRx. Off to a great start. Reviewed MET's, goals and home ExRx. Pt tolerated exercise well with an average MET level of 2.6. Pt is feeling good about his goals and is increasing strength and stamina. He will continue to exercise on his own by walking, stretching and bike riding for 30-45 mins 2-3 days Reviewed MET's. Pt tolerated  exercise well with an average MET level of 2.95. Pt is doing well and progressing his MET's Reviewed MET's and goals. Pt tolerated exercise well with an average MET level of 3.0. Pt is feeling good about his goals. He has been having some issues with chest pain, maybe muscular, due to moving homes. Nurse aware. Overall pt doing well and progressing MET's Reviewed MET's and goals. Pt tolerated exercise well with an average MET level of 3.35. Pt is doing well, pt is limited with exertion by symptoms. But stays in a range that does not flair up symptoms. He is doing well and feels good about his goals and is gaining strength and stamina and  feels like he's getting back to his old self            Exercise Goals and Review:   Exercise Goals     Row Name 03/01/23 1409             Exercise Goals   Increase Physical Activity Yes       Intervention Provide advice, education, support and counseling about physical activity/exercise needs.;Develop an individualized exercise prescription for aerobic and resistive training based on initial evaluation findings, risk stratification, comorbidities and participant's personal goals.       Expected Outcomes Short Term: Attend rehab on a regular basis to increase amount of physical activity.;Long Term: Exercising regularly at least 3-5 days a week.;Long Term: Add in home exercise to make exercise part of routine and to increase amount of physical activity.       Increase Strength and Stamina Yes       Intervention Provide advice, education, support and counseling about physical activity/exercise needs.;Develop an individualized exercise prescription for aerobic and resistive training based on initial evaluation findings, risk stratification, comorbidities and participant's personal goals.       Expected Outcomes Short Term: Increase workloads from initial exercise prescription for resistance, speed, and METs.;Short Term: Perform resistance training exercises routinely during rehab and add in resistance training at home;Long Term: Improve cardiorespiratory fitness, muscular endurance and strength as measured by increased METs and functional capacity ( )       Able to understand and use rate of perceived exertion (RPE) scale Yes       Intervention Provide education and explanation on how to use RPE scale       Expected Outcomes Short Term: Able to use RPE daily in rehab to express subjective intensity level;Long Term:  Able to use RPE to guide intensity level when exercising independently       Knowledge and understanding of Target Heart Rate Range (THRR) Yes       Intervention Provide  education and explanation of THRR including how the numbers were predicted and where they are located for reference       Expected Outcomes Short Term: Able to state/look up THRR;Long Term: Able to use THRR to govern intensity when exercising independently;Short Term: Able to use daily as guideline for intensity in rehab       Understanding of Exercise Prescription Yes       Intervention Provide education, explanation, and written materials on patient's individual exercise prescription       Expected Outcomes Short Term: Able to explain program exercise prescription;Long Term: Able to explain home exercise prescription to exercise independently                Exercise Goals Re-Evaluation :  Exercise Goals Re-Evaluation     Row Name 03/05/23 [phone] 03/19/23  9156 04/16/23 1418 04/30/23 1650 05/21/23 1008     Exercise Goal Re-Evaluation   Exercise Goals Review Increase Physical Activity;Understanding of Exercise Prescription;Increase Strength and Stamina;Knowledge and understanding of Target Heart Rate Range (THRR);Able to understand and use rate of perceived exertion (RPE) scale Increase Physical Activity;Understanding of Exercise Prescription;Increase Strength and Stamina;Knowledge and understanding of Target Heart Rate Range (THRR);Able to understand and use rate of perceived exertion (RPE) scale Increase Physical Activity;Understanding of Exercise Prescription;Increase Strength and Stamina;Knowledge and understanding of Target Heart Rate Range (THRR);Able to understand and use rate of perceived exertion (RPE) scale Increase Physical Activity;Understanding of Exercise Prescription;Increase Strength and Stamina;Knowledge and understanding of Target Heart Rate Range (THRR);Able to understand and use rate of perceived exertion (RPE) scale Increase Physical Activity;Understanding of Exercise Prescription;Increase Strength and Stamina;Knowledge and understanding of Target Heart Rate Range (THRR);Able to  understand and use rate of perceived exertion (RPE) scale   Comments Pt first day in the [facility] program. Pt tolerated exercise well with an average MET level of 1.95. Pt is learning his THRR, RPE and ExRx. Off to a great start. Reviewed MET's, goals and home ExRx. Pt tolerated exercise well with an average MET level of 2.6. Pt is feeling good about his goals and is increasing strength and stamina. He will continue to exercise on his own by walking, stretching and bike riding for 30-45 mins 2-3 days Reviewed MET's and goals. Pt tolerated exercise well with an average MET level of 3.0. Pt is feeling good about his goals. He has been having some issues with chest pain, maybe muscular, due to moving homes. Nurse aware. Overall pt doing well and progressing MET's Reviewed MET's. Pt tolerated exercise well with an average MET level of 3.35. Pt is doing well and progressing MET's Reviewed MET's and goals. Pt tolerated exercise well with an average MET level of 3.35. Pt is doing well, pt is limited with exertion by symptoms. But stays in a range that does not flair up symptoms. He is doing well and feels good about his goals and is gaining strength and stamina and feels like he's getting back to his old self   Expected Outcomes Will continue to monitor pt and progress workloads as tolerated without sign or symptom Will continue to monitor pt and progress workloads as tolerated without sign or symptom Will continue to monitor pt and progress workloads as tolerated without sign or symptom Will continue to monitor pt and progress workloads as tolerated without sign or symptom Will continue to monitor pt and progress workloads as tolerated without sign or symptom            Discharge Exercise Prescription (Final Exercise Prescription Changes):  Exercise Prescription Changes - 05/21/23 1006       Response to Exercise   Blood Pressure (Admit) 130/74    Blood Pressure (Exit) 120/66    Heart Rate (Admit) 65  bpm    Heart Rate (Exercise) 108 bpm    Heart Rate (Exit) 74 bpm    Rating of Perceived Exertion (Exercise) 11    Perceived Dyspnea (Exercise) 0    Symptoms 0    Comments REVD MET's and goals    Duration Progress to 30 minutes of  aerobic without signs/symptoms of physical distress    Intensity THRR unchanged      Progression   Progression Continue to progress workloads to maintain intensity without signs/symptoms of physical distress.    Average METs 3.55      Resistance Training  Training Prescription Yes    Weight 4 lbs wts    Reps 10-15    Time 10 Minutes      Recumbant Bike   Level 3    RPM 79    Watts 48    Minutes 15    METs 3.5      NuStep   Level 5    SPM 97    Minutes 15    METs 3.6      Home Exercise Plan   Plans to continue exercise at Home (comment)    Frequency Add 3 additional days to program exercise sessions.    Initial Home Exercises Provided 03/19/23             Nutrition:  Target Goals: Understanding of nutrition guidelines, daily intake of sodium 1500mg , cholesterol 200mg , calories 30% from fat and 7% or less from saturated fats, daily to have 5 or more servings of fruits and vegetables.  Biometrics:  Pre Biometrics - 03/01/23 1410       Pre Biometrics   Height 5' 5 (1.651 m)    Weight 100.6 kg    Waist Circumference 46.75 inches    Hip Circumference 41 inches    Waist to Hip Ratio 1.14 %    BMI (Calculated) 36.91    Triceps Skinfold 12 mm    % Body Fat 33.3 %    Grip Strength 42 kg    Flexibility 5 in    Single Leg Stand 30 seconds             Post Biometrics - 06/01/23 0838        Post  Biometrics   Height 5' 5 (1.651 m)    Weight 105 kg    Waist Circumference 46 inches    Hip Circumference 42.5 inches    Waist to Hip Ratio 1.08 %    BMI (Calculated) 38.52    Triceps Skinfold 17 mm    % Body Fat 35 %    Grip Strength 48 kg    Flexibility 12.75 in    Single Leg Stand 30 seconds             Nutrition  Therapy Plan and Nutrition Goals:  Nutrition Therapy & Goals - 04/06/23 0912       Nutrition Therapy   Diet Heart Healthy Diet    Drug/Food Interactions Statins/Certain Fruits      Personal Nutrition Goals   Nutrition Goal Patient to identify strategies for reducing cardiovascular risk by attending the [facility] education and nutrition series weekly.   goal not met.   Personal Goal #2 Patient to improve diet quality by using the plate method as a guide for meal planning to include lean protein/plant protein, fruits, vegetables, whole grains, nonfat dairy as part of a well-balanced diet.   goal in progress.   Comments Goal in progress. However, Takashi does not attend the [facility] education and nutrition series. Md has medical history of CABGx4, HTN, CAD, OSA. His cholesterol is well controlled. His A1c remains in a pre-diabetic range. He is up 4.4# since starting with our program. Patient will benefit from participation in intensive cardiac rehab for nutrition, exercise, and lifestyle modification.      Intervention Plan   Intervention Prescribe, educate and counsel regarding individualized specific dietary modifications aiming towards targeted core components such as weight, hypertension, lipid management, diabetes, heart failure and other comorbidities.;Nutrition handout(s) given to patient.    Expected Outcomes Short Term Goal: Understand basic principles  of dietary content, such as calories, fat, sodium, cholesterol and nutrients.;Long Term Goal: Adherence to prescribed nutrition plan.             Nutrition Assessments:  Nutrition Assessments - 03/05/23 1126       Rate Your Plate Scores   Pre Score 60            MEDIFICTS Score Key: >=70 Need to make dietary changes  40-70 Heart Healthy Diet <= 40 Therapeutic Level Cholesterol Diet   Flowsheet Row INTENSIVE CARDIAC REHAB from 03/05/2023 in [facility] for Heart, Vascular, & Lung Health  Picture Your  Plate Total Score on Admission 60      Picture Your Plate Scores: <59 Unhealthy dietary pattern with much room for improvement. 41-50 Dietary pattern unlikely to meet recommendations for good health and room for improvement. 51-60 More healthful dietary pattern, with some room for improvement.  >60 Healthy dietary pattern, although there may be some specific behaviors that could be improved.    Nutrition Goals Re-Evaluation:  Nutrition Goals Re-Evaluation     Row Name 03/05/23 0956 04/06/23 0912           Goals   Current Weight 222 lb 10.6 oz (101 kg) 226 lb 3.1 oz (102.6 kg)      Comment HDL 19, LDL 26, A1c 5.7 no new labs; most recent labs  HDL 19, LDL 26, A1c 5.7      Expected Outcome Kase has medical history of CABGx4, HTN, CAD, OSA. His cholesterol is well controlled. His A1c remains in a pre-diabetic range. Patient will benefit from participation in intensive cardiac rehab for nutrition, exercise, and lifestyle modification. Goal in progress. However, Tyreece does not attend the [facility] education and nutrition series. Camara has medical history of CABGx4, HTN, CAD, OSA. His cholesterol is well controlled. His A1c remains in a pre-diabetic range. He is up 4.4# since starting with our program. Patient will benefit from participation in intensive cardiac rehab for nutrition, exercise, and lifestyle modification.               Nutrition Goals Re-Evaluation:  Nutrition Goals Re-Evaluation     Row Name 03/05/23 0956 04/06/23 0912           Goals   Current Weight 222 lb 10.6 oz (101 kg) 226 lb 3.1 oz (102.6 kg)      Comment HDL 19, LDL 26, A1c 5.7 no new labs; most recent labs  HDL 19, LDL 26, A1c 5.7      Expected Outcome Param has medical history of CABGx4, HTN, CAD, OSA. His cholesterol is well controlled. His A1c remains in a pre-diabetic range. Patient will benefit from participation in intensive cardiac rehab for nutrition, exercise, and lifestyle modification. Goal in  progress. However, Calyb does not attend the [facility] education and nutrition series. Livingston has medical history of CABGx4, HTN, CAD, OSA. His cholesterol is well controlled. His A1c remains in a pre-diabetic range. He is up 4.4# since starting with our program. Patient will benefit from participation in intensive cardiac rehab for nutrition, exercise, and lifestyle modification.               Nutrition Goals Discharge (Final Nutrition Goals Re-Evaluation):  Nutrition Goals Re-Evaluation - 04/06/23 0912       Goals   Current Weight 226 lb 3.1 oz (102.6 kg)    Comment no new labs; most recent labs  HDL 19, LDL 26, A1c 5.7    Expected Outcome  Goal in progress. However, Niall does not attend the [facility] education and nutrition series. Sha has medical history of CABGx4, HTN, CAD, OSA. His cholesterol is well controlled. His A1c remains in a pre-diabetic range. He is up 4.4# since starting with our program. Patient will benefit from participation in intensive cardiac rehab for nutrition, exercise, and lifestyle modification.             Psychosocial: Target Goals: Acknowledge presence or absence of significant depression and/or stress, maximize coping skills, provide positive support system. Participant is able to verbalize types and ability to use techniques and skills needed for reducing stress and depression.  Initial Review & Psychosocial Screening:  Initial Psych Review & Screening - 03/01/23 1412       Initial Review   Current issues with Current Stress Concerns   some mild stress with his health and his wifes health concerns. They are also planning a move. Does not feel the need for any additional resources at this time.   Source of Stress Concerns Family      Family Dynamics   Good Support System? Yes   Wife is good support     Barriers   Psychosocial barriers to participate in program The patient should benefit from training in stress management and relaxation.       Screening Interventions   Interventions Encouraged to exercise;Provide feedback about the scores to participant    Expected Outcomes Long Term Goal: Stressors or current issues are controlled or eliminated.;Short Term goal: Identification and review with participant of any Quality of Life or Depression concerns found by scoring the questionnaire.;Long Term goal: The participant improves quality of Life and PHQ9 Scores as seen by post scores and/or verbalization of changes             Quality of Life Scores:  Quality of Life - 03/01/23 1415       Quality of Life   Select Quality of Life      Quality of Life Scores   Health/Function Pre 13.93 %    Socioeconomic Pre 26.67 %    Psych/Spiritual Pre 20 %    Family Pre 20.4 %    GLOBAL Pre 18.52 %            Scores of 19 and below usually indicate a poorer quality of life in these areas.  A difference of  2-3 points is a clinically meaningful difference.  A difference of 2-3 points in the total score of the Quality of Life Index has been associated with significant improvement in overall quality of life, self-image, physical symptoms, and general health in studies assessing change in quality of life.  PHQ-9: Review Flowsheet  More data exists      06/01/2023 03/01/2023 05/06/2018 03/14/2018 10/31/2017  Depression screen PHQ 2/9  Decreased Interest 0 0 0 0 0  Down, Depressed, Hopeless 0 0 0 0 0  PHQ - 2 Score 0 0 0 0 0  Altered sleeping 0 0 - - -  Tired, decreased energy 0 0 - - -  Change in appetite 0 0 - - -  Feeling bad or failure about yourself  0 0 - - -  Trouble concentrating 0 0 - - -  Moving slowly or fidgety/restless 0 0 - - -  Suicidal thoughts 0 0 - - -  PHQ-9 Score 0 0 - - -   Interpretation of Total Score  Total Score Depression Severity:  1-4 = Minimal depression, 5-9 = Mild depression,  10-14 = Moderate depression, 15-19 = Moderately severe depression, 20-27 = Severe depression   Psychosocial Evaluation and  Intervention:   Psychosocial Re-Evaluation:  Psychosocial Re-Evaluation     Row Name 03/15/23 0956 05/01/23 1419 05/31/23 1108         Psychosocial Re-Evaluation   Current issues with Current Stress Concerns Current Stress Concerns Current Stress Concerns     Comments Hau started cardiac rehab on 03/05/23. Coery has not voiced any increased concersn or stressors during exercise. Will review quality of life questionnaire in the upcoming week Renley  has not voiced any increased concerns  or stressors during exercise. Quality of life questionnaire reviewed on 03/19/23. Pamela  has not voiced any increased concerns  or stressors during exercise. Coery will complete cardiac rehab on 06/04/23     Expected Outcomes Robi will have controlled or decreased stress upon completing cardiac rehab Phenix will have controlled or decreased stress upon completing cardiac rehab Ziah will have controlled or decreased stress upon completing cardiac rehab     Interventions Encouraged to attend Cardiac Rehabilitation for the exercise;Relaxation education;Encouraged to attend Pulmonary Rehabilitation for the exercise Encouraged to attend Cardiac Rehabilitation for the exercise;Relaxation education;Encouraged to attend Pulmonary Rehabilitation for the exercise Encouraged to attend Cardiac Rehabilitation for the exercise;Relaxation education;Encouraged to attend Pulmonary Rehabilitation for the exercise     Continue Psychosocial Services  Follow up required by staff No Follow up required No Follow up required       Initial Review   Source of Stress Concerns Family Family Family     Comments will continue to monitor and offer support as needed will continue to monitor and offer support as needed will continue to monitor and offer support as needed              Psychosocial Discharge (Final Psychosocial Re-Evaluation):  Psychosocial Re-Evaluation - 05/31/23 1108       Psychosocial Re-Evaluation   Current  issues with Current Stress Concerns    Comments Teandre  has not voiced any increased concerns  or stressors during exercise. Coery will complete cardiac rehab on 06/04/23    Expected Outcomes Bryler will have controlled or decreased stress upon completing cardiac rehab    Interventions Encouraged to attend Cardiac Rehabilitation for the exercise;Relaxation education;Encouraged to attend Pulmonary Rehabilitation for the exercise    Continue Psychosocial Services  No Follow up required      Initial Review   Source of Stress Concerns Family    Comments will continue to monitor and offer support as needed             Vocational Rehabilitation: Provide vocational rehab assistance to qualifying candidates.   Vocational Rehab Evaluation & Intervention:  Vocational Rehab - 03/01/23 1420       Initial Vocational Rehab Evaluation & Intervention   Assessment shows need for Vocational Rehabilitation No   No needs at this time            Education: Education Goals: Education classes will be provided on a weekly basis, covering required topics. Participant will state understanding/return demonstration of topics presented.    Education     Row Name 03/12/23 0800     Education   Cardiac Education Topics Pritikin   Psychologist, Forensic Exercise Education   Exercise Education Biomechanial Limitations   Instruction Review Code 1- Verbalizes Understanding   Class Start Time 0815   Class Stop  Time 0855   Class Time Calculation (min) 40 min            Core Videos: Exercise    Move It!  Clinical staff conducted group or individual video education with verbal and written material and guidebook.  Patient learns the recommended Pritikin exercise program. Exercise with the goal of living a long, healthy life. Some of the health benefits of exercise include controlled diabetes, healthier blood pressure levels, improved  cholesterol levels, improved heart and lung capacity, improved sleep, and better body composition. Everyone should speak with their doctor before starting or changing an exercise routine.  Biomechanical Limitations Clinical staff conducted group or individual video education with verbal and written material and guidebook.  Patient learns how biomechanical limitations can impact exercise and how we can mitigate and possibly overcome limitations to have an impactful and balanced exercise routine.  Body Composition Clinical staff conducted group or individual video education with verbal and written material and guidebook.  Patient learns that body composition (ratio of muscle mass to fat mass) is a key component to assessing overall fitness, rather than body weight alone. Increased fat mass, especially visceral belly fat, can put us  at increased risk for metabolic syndrome, type 2 diabetes, heart disease, and even death. It is recommended to combine diet and exercise (cardiovascular and resistance training) to improve your body composition. Seek guidance from your physician and exercise physiologist before implementing an exercise routine.  Exercise Action Plan Clinical staff conducted group or individual video education with verbal and written material and guidebook.  Patient learns the recommended strategies to achieve and enjoy long-term exercise adherence, including variety, self-motivation, self-efficacy, and positive decision making. Benefits of exercise include fitness, good health, weight management, more energy, better sleep, less stress, and overall well-being.  Medical   Heart Disease Risk Reduction Clinical staff conducted group or individual video education with verbal and written material and guidebook.  Patient learns our heart is our most vital organ as it circulates oxygen, nutrients, white blood cells, and hormones throughout the entire body, and carries waste away. Data supports a  plant-based eating plan like the Pritikin Program for its effectiveness in slowing progression of and reversing heart disease. The video provides a number of recommendations to address heart disease.   Metabolic Syndrome and Belly Fat  Clinical staff conducted group or individual video education with verbal and written material and guidebook.  Patient learns what metabolic syndrome is, how it leads to heart disease, and how one can reverse it and keep it from coming back. You have metabolic syndrome if you have 3 of the following 5 criteria: abdominal obesity, high blood pressure, high triglycerides, low HDL cholesterol, and high blood sugar.  Hypertension and Heart Disease Clinical staff conducted group or individual video education with verbal and written material and guidebook.  Patient learns that high blood pressure, or hypertension, is very common in the [place] . Hypertension is largely due to excessive salt intake, but other important risk factors include being overweight, physical inactivity, drinking too much alcohol , smoking, and not eating enough potassium from fruits and vegetables. High blood pressure is a leading risk factor for heart attack, stroke, congestive heart failure, dementia, kidney failure, and premature death. Long-term effects of excessive salt intake include stiffening of the arteries and thickening of heart muscle and organ damage. Recommendations include ways to reduce hypertension and the risk of heart disease.  Diseases of Our Time - Focusing on Diabetes Clinical staff conducted group or individual video  education with verbal and written material and guidebook.  Patient learns why the best way to stop diseases of our time is prevention, through food and other lifestyle changes. Medicine (such as prescription pills and surgeries) is often only a Band-Aid on the problem, not a long-term solution. Most common diseases of our time include obesity, type 2 diabetes,  hypertension, heart disease, and cancer. The Pritikin Program is recommended and has been proven to help reduce, reverse, and/or prevent the damaging effects of metabolic syndrome.  Nutrition   Overview of the Pritikin Eating Plan  Clinical staff conducted group or individual video education with verbal and written material and guidebook.  Patient learns about the Pritikin Eating Plan for disease risk reduction. The Pritikin Eating Plan emphasizes a wide variety of unrefined, minimally-processed carbohydrates, like fruits, vegetables, whole grains, and legumes. Go, Caution, and Stop food choices are explained. Plant-based and lean animal proteins are emphasized. Rationale provided for low sodium intake for blood pressure control, low added sugars for blood sugar stabilization, and low added fats and oils for coronary artery disease risk reduction and weight management.  Calorie Density  Clinical staff conducted group or individual video education with verbal and written material and guidebook.  Patient learns about calorie density and how it impacts the Pritikin Eating Plan. Knowing the characteristics of the food you choose will help you decide whether those foods will lead to weight gain or weight loss, and whether you want to consume more or less of them. Weight loss is usually a side effect of the Pritikin Eating Plan because of its focus on low calorie-dense foods.  Label Reading  Clinical staff conducted group or individual video education with verbal and written material and guidebook.  Patient learns about the Pritikin recommended label reading guidelines and corresponding recommendations regarding calorie density, added sugars, sodium content, and whole grains.  Dining Out - Part 1  Clinical staff conducted group or individual video education with verbal and written material and guidebook.  Patient learns that restaurant meals can be sabotaging because they can be so high in calories, fat,  sodium, and/or sugar. Patient learns recommended strategies on how to positively address this and avoid unhealthy pitfalls.  Facts on Fats  Clinical staff conducted group or individual video education with verbal and written material and guidebook.  Patient learns that lifestyle modifications can be just as effective, if not more so, as many medications for lowering your risk of heart disease. A Pritikin lifestyle can help to reduce your risk of inflammation and atherosclerosis (cholesterol build-up, or plaque, in the artery walls). Lifestyle interventions such as dietary choices and physical activity address the cause of atherosclerosis. A review of the types of fats and their impact on blood cholesterol levels, along with dietary recommendations to reduce fat intake is also included.  Nutrition Action Plan  Clinical staff conducted group or individual video education with verbal and written material and guidebook.  Patient learns how to incorporate Pritikin recommendations into their lifestyle. Recommendations include planning and keeping personal health goals in mind as an important part of their success.  Healthy Mind-Set    Healthy Minds, Bodies, Hearts  Clinical staff conducted group or individual video education with verbal and written material and guidebook.  Patient learns how to identify when they are stressed. Video will discuss the impact of that stress, as well as the many benefits of stress management. Patient will also be introduced to stress management techniques. The way we think, act, and feel has  an impact on our hearts.  How Our Thoughts Can Heal Our Hearts  Clinical staff conducted group or individual video education with verbal and written material and guidebook.  Patient learns that negative thoughts can cause depression and anxiety. This can result in negative lifestyle behavior and serious health problems. Cognitive behavioral therapy is an effective method to help control  our thoughts in order to change and improve our emotional outlook.  Additional Videos:  Exercise    Improving Performance  Clinical staff conducted group or individual video education with verbal and written material and guidebook.  Patient learns to use a non-linear approach by alternating intensity levels and lengths of time spent exercising to help burn more calories and lose more body fat. Cardiovascular exercise helps improve heart health, metabolism, hormonal balance, blood sugar control, and recovery from fatigue. Resistance training improves strength, endurance, balance, coordination, reaction time, metabolism, and muscle mass. Flexibility exercise improves circulation, posture, and balance. Seek guidance from your physician and exercise physiologist before implementing an exercise routine and learn your capabilities and proper form for all exercise.  Introduction to Yoga  Clinical staff conducted group or individual video education with verbal and written material and guidebook.  Patient learns about yoga, a discipline of the coming together of mind, breath, and body. The benefits of yoga include improved flexibility, improved range of motion, better posture and core strength, increased lung function, weight loss, and positive self-image. Yoga's heart health benefits include lowered blood pressure, healthier heart rate, decreased cholesterol and triglyceride levels, improved immune function, and reduced stress. Seek guidance from your physician and exercise physiologist before implementing an exercise routine and learn your capabilities and proper form for all exercise.  Medical   Aging: Enhancing Your Quality of Life  Clinical staff conducted group or individual video education with verbal and written material and guidebook.  Patient learns key strategies and recommendations to stay in good physical health and enhance quality of life, such as prevention strategies, having an advocate,  securing a Health Care Proxy and Power of Attorney, and keeping a list of medications and system for tracking them. It also discusses how to avoid risk for bone loss.  Biology of Weight Control  Clinical staff conducted group or individual video education with verbal and written material and guidebook.  Patient learns that weight gain occurs because we consume more calories than we burn (eating more, moving less). Even if your body weight is normal, you may have higher ratios of fat compared to muscle mass. Too much body fat puts you at increased risk for cardiovascular disease, heart attack, stroke, type 2 diabetes, and obesity-related cancers. In addition to exercise, following the Pritikin Eating Plan can help reduce your risk.  Decoding Lab Results  Clinical staff conducted group or individual video education with verbal and written material and guidebook.  Patient learns that lab test reflects one measurement whose values change over time and are influenced by many factors, including medication, stress, sleep, exercise, food, hydration, pre-existing medical conditions, and more. It is recommended to use the knowledge from this video to become more involved with your lab results and evaluate your numbers to speak with your doctor.   Diseases of Our Time - Overview  Clinical staff conducted group or individual video education with verbal and written material and guidebook.  Patient learns that according to the CDC, 50% to 70% of chronic diseases (such as obesity, type 2 diabetes, elevated lipids, hypertension, and heart disease) are avoidable through lifestyle improvements  including healthier food choices, listening to satiety cues, and increased physical activity.  Sleep Disorders Clinical staff conducted group or individual video education with verbal and written material and guidebook.  Patient learns how good quality and duration of sleep are important to overall health and well-being.  Patient also learns about sleep disorders and how they impact health along with recommendations to address them, including discussing with a physician.  Nutrition  Dining Out - Part 2 Clinical staff conducted group or individual video education with verbal and written material and guidebook.  Patient learns how to plan ahead and communicate in order to maximize their dining experience in a healthy and nutritious manner. Included are recommended food choices based on the type of restaurant the patient is visiting.   Fueling a Banker conducted group or individual video education with verbal and written material and guidebook.  There is a strong connection between our food choices and our health. Diseases like obesity and type 2 diabetes are very prevalent and are in large-part due to lifestyle choices. The Pritikin Eating Plan provides plenty of food and hunger-curbing satisfaction. It is easy to follow, affordable, and helps reduce health risks.  Menu Workshop  Clinical staff conducted group or individual video education with verbal and written material and guidebook.  Patient learns that restaurant meals can sabotage health goals because they are often packed with calories, fat, sodium, and sugar. Recommendations include strategies to plan ahead and to communicate with the manager, chef, or server to help order a healthier meal.  Planning Your Eating Strategy  Clinical staff conducted group or individual video education with verbal and written material and guidebook.  Patient learns about the Pritikin Eating Plan and its benefit of reducing the risk of disease. The Pritikin Eating Plan does not focus on calories. Instead, it emphasizes high-quality, nutrient-rich foods. By knowing the characteristics of the foods, we choose, we can determine their calorie density and make informed decisions.  Targeting Your Nutrition Priorities  Clinical staff conducted group or individual  video education with verbal and written material and guidebook.  Patient learns that lifestyle habits have a tremendous impact on disease risk and progression. This video provides eating and physical activity recommendations based on your personal health goals, such as reducing LDL cholesterol, losing weight, preventing or controlling type 2 diabetes, and reducing high blood pressure.  Vitamins and Minerals  Clinical staff conducted group or individual video education with verbal and written material and guidebook.  Patient learns different ways to obtain key vitamins and minerals, including through a recommended healthy diet. It is important to discuss all supplements you take with your doctor.   Healthy Mind-Set    Smoking Cessation  Clinical staff conducted group or individual video education with verbal and written material and guidebook.  Patient learns that cigarette smoking and tobacco addiction pose a serious health risk which affects millions of people. Stopping smoking will significantly reduce the risk of heart disease, lung disease, and many forms of cancer. Recommended strategies for quitting are covered, including working with your doctor to develop a successful plan.  Culinary   Becoming a Set Designer conducted group or individual video education with verbal and written material and guidebook.  Patient learns that cooking at home can be healthy, cost-effective, quick, and puts them in control. Keys to cooking healthy recipes will include looking at your recipe, assessing your equipment needs, planning ahead, making it simple, choosing cost-effective seasonal ingredients, and limiting  the use of added fats, salts, and sugars.  Cooking - Breakfast and Snacks  Clinical staff conducted group or individual video education with verbal and written material and guidebook.  Patient learns how important breakfast is to satiety and nutrition through the entire day.  Recommendations include key foods to eat during breakfast to help stabilize blood sugar levels and to prevent overeating at meals later in the day. Planning ahead is also a key component.  Cooking - Educational Psychologist conducted group or individual video education with verbal and written material and guidebook.  Patient learns eating strategies to improve overall health, including an approach to cook more at home. Recommendations include thinking of animal protein as a side on your plate rather than center stage and focusing instead on lower calorie dense options like vegetables, fruits, whole grains, and plant-based proteins, such as beans. Making sauces in large quantities to freeze for later and leaving the skin on your vegetables are also recommended to maximize your experience.  Cooking - Healthy Salads and Dressing Clinical staff conducted group or individual video education with verbal and written material and guidebook.  Patient learns that vegetables, fruits, whole grains, and legumes are the foundations of the Pritikin Eating Plan. Recommendations include how to incorporate each of these in flavorful and healthy salads, and how to create homemade salad dressings. Proper handling of ingredients is also covered. Cooking - Soups and [employer] - Soups and Desserts Clinical staff conducted group or individual video education with verbal and written material and guidebook.  Patient learns that Pritikin soups and desserts make for easy, nutritious, and delicious snacks and meal components that are low in sodium, fat, sugar, and calorie density, while high in vitamins, minerals, and filling fiber. Recommendations include simple and healthy ideas for soups and desserts.   Overview     The Pritikin Solution Program Overview Clinical staff conducted group or individual video education with verbal and written material and guidebook.  Patient learns that the results of the Pritikin  Program have been documented in more than 100 articles published in peer-reviewed journals, and the benefits include reducing risk factors for (and, in some cases, even reversing) high cholesterol, high blood pressure, type 2 diabetes, obesity, and more! An overview of the three key pillars of the Pritikin Program will be covered: eating well, doing regular exercise, and having a healthy mind-set.  WORKSHOPS  Exercise: Exercise Basics: Building Your Action Plan Clinical staff led group instruction and group discussion with PowerPoint presentation and patient guidebook. To enhance the learning environment the use of posters, models and videos may be added. At the conclusion of this workshop, patients will comprehend the difference between physical activity and exercise, as well as the benefits of incorporating both, into their routine. Patients will understand the FITT (Frequency, Intensity, Time, and Type) principle and how to use it to build an exercise action plan. In addition, safety concerns and other considerations for exercise and cardiac rehab will be addressed by the presenter. The purpose of this lesson is to promote a comprehensive and effective weekly exercise routine in order to improve patients' overall level of fitness.   Managing Heart Disease: Your Path to a Healthier Heart Clinical staff led group instruction and group discussion with PowerPoint presentation and patient guidebook. To enhance the learning environment the use of posters, models and videos may be added.At the conclusion of this workshop, patients will understand the anatomy and physiology of the heart. Additionally, they will  understand how Pritikin's three pillars impact the risk factors, the progression, and the management of heart disease.  The purpose of this lesson is to provide a high-level overview of the heart, heart disease, and how the Pritikin lifestyle positively impacts risk factors.  Exercise  Biomechanics Clinical staff led group instruction and group discussion with PowerPoint presentation and patient guidebook. To enhance the learning environment the use of posters, models and videos may be added. Patients will learn how the structural parts of their bodies function and how these functions impact their daily activities, movement, and exercise. Patients will learn how to promote a neutral spine, learn how to manage pain, and identify ways to improve their physical movement in order to promote healthy living. The purpose of this lesson is to expose patients to common physical limitations that impact physical activity. Participants will learn practical ways to adapt and manage aches and pains, and to minimize their effect on regular exercise. Patients will learn how to maintain good posture while sitting, walking, and lifting.  Balance Training and Fall Prevention  Clinical staff led group instruction and group discussion with PowerPoint presentation and patient guidebook. To enhance the learning environment the use of posters, models and videos may be added. At the conclusion of this workshop, patients will understand the importance of their sensorimotor skills (vision, proprioception, and the vestibular system) in maintaining their ability to balance as they age. Patients will apply a variety of balancing exercises that are appropriate for their current level of function. Patients will understand the common causes for poor balance, possible solutions to these problems, and ways to modify their physical environment in order to minimize their fall risk. The purpose of this lesson is to teach patients about the importance of maintaining balance as they age and ways to minimize their risk of falling.  WORKSHOPS   Nutrition:  Fueling a Ship Broker led group instruction and group discussion with PowerPoint presentation and patient guidebook. To enhance the learning  environment the use of posters, models and videos may be added. Patients will review the foundational principles of the Pritikin Eating Plan and understand what constitutes a serving size in each of the food groups. Patients will also learn Pritikin-friendly foods that are better choices when away from home and review make-ahead meal and snack options. Calorie density will be reviewed and applied to three nutrition priorities: weight maintenance, weight loss, and weight gain. The purpose of this lesson is to reinforce (in a group setting) the key concepts around what patients are recommended to eat and how to apply these guidelines when away from home by planning and selecting Pritikin-friendly options. Patients will understand how calorie density may be adjusted for different weight management goals.  Mindful Eating  Clinical staff led group instruction and group discussion with PowerPoint presentation and patient guidebook. To enhance the learning environment the use of posters, models and videos may be added. Patients will briefly review the concepts of the Pritikin Eating Plan and the importance of low-calorie dense foods. The concept of mindful eating will be introduced as well as the importance of paying attention to internal hunger signals. Triggers for non-hunger eating and techniques for dealing with triggers will be explored. The purpose of this lesson is to provide patients with the opportunity to review the basic principles of the Pritikin Eating Plan, discuss the value of eating mindfully and how to measure internal cues of hunger and fullness using the Hunger Scale. Patients will also discuss reasons for  non-hunger eating and learn strategies to use for controlling emotional eating.  Targeting Your Nutrition Priorities Clinical staff led group instruction and group discussion with PowerPoint presentation and patient guidebook. To enhance the learning environment the use of posters, models and  videos may be added. Patients will learn how to determine their genetic susceptibility to disease by reviewing their family history. Patients will gain insight into the importance of diet as part of an overall healthy lifestyle in mitigating the impact of genetics and other environmental insults. The purpose of this lesson is to provide patients with the opportunity to assess their personal nutrition priorities by looking at their family history, their own health history and current risk factors. Patients will also be able to discuss ways of prioritizing and modifying the Pritikin Eating Plan for their highest risk areas  Menu  Clinical staff led group instruction and group discussion with PowerPoint presentation and patient guidebook. To enhance the learning environment the use of posters, models and videos may be added. Using menus brought in from [employer], or printed from [employer], patients will apply the Pritikin dining out guidelines that were presented in the [employer] video. Patients will also be able to practice these guidelines in a variety of provided scenarios. The purpose of this lesson is to provide patients with the opportunity to practice hands-on learning of the Pritikin Dining Out guidelines with actual menus and practice scenarios.  Label Reading Clinical staff led group instruction and group discussion with PowerPoint presentation and patient guidebook. To enhance the learning environment the use of posters, models and videos may be added. Patients will review and discuss the Pritikin label reading guidelines presented in Pritikin's Label Reading Educational series video. Using fool labels brought in from local grocery stores and markets, patients will apply the label reading guidelines and determine if the packaged food meet the Pritikin guidelines. The purpose of this lesson is to provide patients with the opportunity to review, discuss, and practice  hands-on learning of the Pritikin Label Reading guidelines with actual packaged food labels. Cooking School  Pritikin's [employer] are designed to teach patients ways to prepare quick, simple, and affordable recipes at home. The importance of nutrition's role in chronic disease risk reduction is reflected in its emphasis in the overall Pritikin program. By learning how to prepare essential core Pritikin Eating Plan recipes, patients will increase control over what they eat; be able to customize the flavor of foods without the use of added salt, sugar, or fat; and improve the quality of the food they consume. By learning a set of core recipes which are easily assembled, quickly prepared, and affordable, patients are more likely to prepare more healthy foods at home. These workshops focus on convenient breakfasts, simple entres, side dishes, and desserts which can be prepared with minimal effort and are consistent with nutrition recommendations for cardiovascular risk reduction. Cooking [employer] are taught by a armed forces logistics/support/administrative officer (RD) who has been trained by the [employer]. The chef or RD has a clear understanding of the importance of minimizing - if not completely eliminating - added fat, sugar, and sodium in recipes. Throughout the series of Cooking School Workshop sessions, patients will learn about healthy ingredients and efficient methods of cooking to build confidence in their capability to prepare    Cooking School weekly topics:  Adding Flavor- Sodium-Free  Fast and Healthy Breakfasts  Powerhouse Plant-Based Proteins  Satisfying Salads and Dressings  Simple Sides  and Sauces  International Cuisine-Spotlight on the [employer] Zones  Delicious Desserts  Savory Soups  [employer] - Meals in a Astronomer Appetizers and Snacks  Comforting Weekend Breakfasts  One-Pot Wonders   Fast Evening Meals  Landscape Architect Your Pritikin  Plate  WORKSHOPS   Healthy Mindset (Psychosocial):  Focused Goals, Sustainable Changes Clinical staff led group instruction and group discussion with PowerPoint presentation and patient guidebook. To enhance the learning environment the use of posters, models and videos may be added. Patients will be able to apply effective goal setting strategies to establish at least one personal goal, and then take consistent, meaningful action toward that goal. They will learn to identify common barriers to achieving personal goals and develop strategies to overcome them. Patients will also gain an understanding of how our mind-set can impact our ability to achieve goals and the importance of cultivating a positive and growth-oriented mind-set. The purpose of this lesson is to provide patients with a deeper understanding of how to set and achieve personal goals, as well as the tools and strategies needed to overcome common obstacles which may arise along the way.  From Head to Heart: The Power of a Healthy Outlook  Clinical staff led group instruction and group discussion with PowerPoint presentation and patient guidebook. To enhance the learning environment the use of posters, models and videos may be added. Patients will be able to recognize and describe the impact of emotions and mood on physical health. They will discover the importance of self-care and explore self-care practices which may work for them. Patients will also learn how to utilize the 4 C's to cultivate a healthier outlook and better manage stress and challenges. The purpose of this lesson is to demonstrate to patients how a healthy outlook is an essential part of maintaining good health, especially as they continue their cardiac rehab journey.  Healthy Sleep for a Healthy Heart Clinical staff led group instruction and group discussion with PowerPoint presentation and patient guidebook. To enhance the learning environment the use of posters,  models and videos may be added. At the conclusion of this workshop, patients will be able to demonstrate knowledge of the importance of sleep to overall health, well-being, and quality of life. They will understand the symptoms of, and treatments for, common sleep disorders. Patients will also be able to identify daytime and nighttime behaviors which impact sleep, and they will be able to apply these tools to help manage sleep-related challenges. The purpose of this lesson is to provide patients with a general overview of sleep and outline the importance of quality sleep. Patients will learn about a few of the most common sleep disorders. Patients will also be introduced to the concept of "sleep hygiene," and discover ways to self-manage certain sleeping problems through simple daily behavior changes. Finally, the workshop will motivate patients by clarifying the links between quality sleep and their goals of heart-healthy living.   Recognizing and Reducing Stress Clinical staff led group instruction and group discussion with PowerPoint presentation and patient guidebook. To enhance the learning environment the use of posters, models and videos may be added. At the conclusion of this workshop, patients will be able to understand the types of stress reactions, differentiate between acute and chronic stress, and recognize the impact that chronic stress has on their health. They will also be able to apply different coping mechanisms, such as reframing negative self-talk. Patients will have the opportunity to practice a variety of stress management  techniques, such as deep abdominal breathing, progressive muscle relaxation, and/or guided imagery.  The purpose of this lesson is to educate patients on the role of stress in their lives and to provide healthy techniques for coping with it.  Learning Barriers/Preferences:  Learning Barriers/Preferences - 03/01/23 1416       Learning Barriers/Preferences   Learning  Barriers None    Learning Preferences Group Instruction;Individual Instruction;Pictoral;Video;Written Material;Skilled Demonstration;Computer/Internet             Education Topics:  Knowledge Questionnaire Score:  Knowledge Questionnaire Score - 03/01/23 1416       Knowledge Questionnaire Score   Pre Score 19/24             Core Components/Risk Factors/Patient Goals at Admission:  Personal Goals and Risk Factors at Admission - 03/01/23 1422       Core Components/Risk Factors/Patient Goals on Admission    Weight Management Yes;Obesity;Weight Loss    Intervention Weight Management: Develop a combined nutrition and exercise program designed to reach desired caloric intake, while maintaining appropriate intake of nutrient and fiber, sodium and fats, and appropriate energy expenditure required for the weight goal.;Weight Management: Provide education and appropriate resources to help participant work on and attain dietary goals.;Weight Management/Obesity: Establish reasonable short term and long term weight goals.;Obesity: Provide education and appropriate resources to help participant work on and attain dietary goals.    Admit Weight 220 lb 14.4 oz (100.2 kg)    Expected Outcomes Short Term: Continue to assess and modify interventions until short term weight is achieved;Long Term: Adherence to nutrition and physical activity/exercise program aimed toward attainment of established weight goal;Weight Loss: Understanding of general recommendations for a balanced deficit meal plan, which promotes 1-2 lb weight loss per week and includes a negative energy balance of [phone] kcal/d;Understanding recommendations for meals to include 15-35% energy as protein, 25-35% energy from fat, 35-60% energy from carbohydrates, less than 200mg  of dietary cholesterol, 20-35 gm of total fiber daily;Understanding of distribution of calorie intake throughout the day with the consumption of 4-5 meals/snacks     Hypertension Yes    Intervention Provide education on lifestyle modifcations including regular physical activity/exercise, weight management, moderate sodium restriction and increased consumption of fresh fruit, vegetables, and low fat dairy, alcohol  moderation, and smoking cessation.;Monitor prescription use compliance.    Expected Outcomes Short Term: Continued assessment and intervention until BP is < 140/28mm HG in hypertensive participants. < 130/61mm HG in hypertensive participants with diabetes, heart failure or chronic kidney disease.;Long Term: Maintenance of blood pressure at goal levels.    Lipids Yes    Intervention Provide education and support for participant on nutrition & aerobic/resistive exercise along with prescribed medications to achieve LDL 70mg , HDL >40mg .    Expected Outcomes Short Term: Participant states understanding of desired cholesterol values and is compliant with medications prescribed. Participant is following exercise prescription and nutrition guidelines.;Long Term: Cholesterol controlled with medications as prescribed, with individualized exercise RX and with personalized nutrition plan. Value goals: LDL < 70mg , HDL > 40 mg.    Stress Yes    Intervention Offer individual and/or small group education and counseling on adjustment to heart disease, stress management and health-related lifestyle change. Teach and support self-help strategies.;Refer participants experiencing significant psychosocial distress to appropriate mental health specialists for further evaluation and treatment. When possible, include family members and significant others in education/counseling sessions.    Expected Outcomes Short Term: Participant demonstrates changes in health-related behavior, relaxation and other stress management skills, ability  to obtain effective social support, and compliance with psychotropic medications if prescribed.;Long Term: Emotional wellbeing is indicated by absence of  clinically significant psychosocial distress or social isolation.    Personal Goal Other Yes    Personal Goal Pt would like to get back to normal, and have long life, know more about sternal precautions    Intervention Will continue to monitor pt and progress workloads as tolerated without sign or symptom    Expected Outcomes Pt will achieve his goals             Core Components/Risk Factors/Patient Goals Review:   Goals and Risk Factor Review     Row Name 03/15/23 0959 04/16/23 1717 05/01/23 1421 05/31/23 1110       Core Components/Risk Factors/Patient Goals Review   Personal Goals Review Weight Management/Obesity;Hypertension;Lipids;Stress Weight Management/Obesity;Hypertension;Lipids;Stress Weight Management/Obesity;Hypertension;Lipids;Stress Weight Management/Obesity;Hypertension;Lipids;Stress    Review Kwinton started cardiac rehab on 03/05/23 and is off to a good start to exercise. Vital signs have been stable. HDL 19, other Lipid values WNL Akiel continues to do well with exercise at cardiac rehab. Vital signs remain stable. Karry continues to do well with exercise at cardiac rehab. Vital signs remain stable. Yasseen has gained 2.4 kg since starting cardiac rehab. Roque continues to do well with exercise at cardiac rehab. Vital signs remain stable. Theadore has gained 3.4 kg since starting cardiac rehab. Reo will complete cardiac rehab on 06/04/23    Expected Outcomes Coery will continue to participate in cardiac rehab for exercise, nutrition and lifestyle modifications Coery will continue to participate in cardiac rehab for exercise, nutrition and lifestyle modifications Coery will continue to participate in cardiac rehab for exercise, nutrition and lifestyle modifications Coery will continue to participate in cardiac rehab for exercise, nutrition and lifestyle modifications             Core Components/Risk Factors/Patient Goals at Discharge (Final Review):   Goals and Risk Factor  Review - 05/31/23 1110       Core Components/Risk Factors/Patient Goals Review   Personal Goals Review Weight Management/Obesity;Hypertension;Lipids;Stress    Review Kengo continues to do well with exercise at cardiac rehab. Vital signs remain stable. Semaje has gained 3.4 kg since starting cardiac rehab. 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 Introduction to [facility] education / intensive cardiac rehab. Initial orientation packet reviewed with patient. 30 Day ITP Review. [place] started cardiac rehab on 03/05/23 and is off to a good start to exercise. 30 Day ITP Review. Eidan has good attendance and participation with exercise at cardiac rehab. 30 Day ITP Review. Jerrit continues to have good attendance and participation in cardiac rehab. 30 Day ITP Review. Muhammad continues to have good attendance and participation in cardiac rehab. Traveion completes cardiac rehab on 06/04/23.            Comments: See ITP comment.

## 2023-06-06 ENCOUNTER — Encounter (HOSPITAL_COMMUNITY)
Admission: RE | Admit: 2023-06-06 | Discharge: 2023-06-06 | Disposition: A | Discharge status: Home / Self Care | Payer: No Typology Code available for payment source | Source: Ambulatory Visit | Attending: Cardiology | Admitting: Cardiology

## 2023-06-06 ENCOUNTER — Encounter (HOSPITAL_COMMUNITY): Payer: Self-pay

## 2023-06-06 ENCOUNTER — Encounter (HOSPITAL_COMMUNITY)
Admission: RE | Admit: 2023-06-06 | Discharge: 2023-06-06 | Disposition: A | Discharge status: Home / Self Care | Payer: No Typology Code available for payment source | Source: Ambulatory Visit

## 2023-06-06 DIAGNOSIS — Z951 Presence of aortocoronary bypass graft: Secondary | ICD-10-CM

## 2023-06-06 NOTE — Progress Notes (Signed)
 Discharge Progress Report  Patient Details  Name: Christopher [name] MRN: [id number] Date of Birth: 18-Feb-1966 Referring Provider:   Flowsheet Row INTENSIVE CARDIAC REHAB ORIENT from 03/01/2023 in [facility]  Referring Provider Dr. Ripley Borg, MD (Dr. Wilbert Bihari covering)        Number of Visits: 33 visits (35 sessions)  Reason for Discharge:  Patient reached a stable level of exercise. Patient independent in their exercise. Patient has met program and personal goals.  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 Ripley Borg, MD (Dr. Wilbert Bihari covering)    Expected Discharge Date 05/21/23      Recumbant Bike   Level 2    RPM 60    Watts 15    Minutes 15    METs 1.9      NuStep   Level 2    SPM 80    Minutes 15    METs 2      Prescription Details   Frequency (times per week) 3    Duration Progress to 30 minutes of continuous aerobic without signs/symptoms of physical distress      Intensity   THRR 40-80% of Max Heartrate 66-132    Ratings of Perceived Exertion 11-13    Perceived Dyspnea 0-4      Progression   Progression Continue progressive overload as per policy without signs/symptoms or physical distress.      Resistance Training   Training Prescription Yes    Weight 3    Reps 10-15             Discharge Exercise Prescription (Final Exercise Prescription Changes):  Exercise Prescription Changes - 06/06/23 0839       Response to Exercise   Blood Pressure (Admit) 130/80    Blood Pressure (Exit) 136/84    Heart Rate (Admit) 70 bpm    Heart Rate (Exercise) 100 bpm    Heart Rate (Exit) 79 bpm    Rating  of Perceived Exertion (Exercise) 11    Perceived Dyspnea (Exercise) 0    Symptoms 0    Comments Pt graduated the [employer] program    Duration Progress to 30 minutes of  aerobic without signs/symptoms of physical distress    Intensity THRR unchanged      Progression   Progression Continue to progress workloads to maintain intensity without signs/symptoms of physical distress.    Average METs 3.15      Resistance Training   Training Prescription No    Weight 4 lbs wts    Reps 10-15    Time 10 Minutes      Recumbant Bike   Level 3    RPM 76    Watts 49    Minutes 15    METs 3.4      NuStep   Level 5    Minutes 15    METs 2.9      Home Exercise Plan   Plans to continue exercise at Home (comment)    Frequency Add 3 additional days to program exercise sessions.    Initial Home Exercises Provided 03/19/23  Functional Capacity:  6 Minute Walk     Row Name 03/01/23 1359 06/01/23 0829       6 Minute Walk   Phase Initial Discharge    Distance 1265 feet 1515 feet    Distance % Change -- 19.76 %    Distance Feet Change -- 250 ft    Walk Time 6 minutes 6 minutes    # of Rest Breaks 0 0    MPH 2.4 2.87    METS 3.16 3.44    RPE 7 11    Perceived Dyspnea  0 0    VO2 Peak 11.06 12.03    Symptoms Yes (comment) Yes (comment)    Comments incision site pain 2/10, right side of scar, resolved with rest Chest muscle pain, incision pain chronic, resolved with rest    Resting HR 75 bpm 62 bpm    Resting BP 140/72 132/78    Resting Oxygen Saturation  99 % --    Exercise Oxygen Saturation  during 6 min walk 100 % --    Max Ex. HR 92 bpm 92 bpm    Max Ex. BP 150/84 140/80    2 Minute Post BP 148/82 --             Psychological, QOL, Others - Outcomes: PHQ 2/9:    06/01/2023    8:28 AM 03/01/2023    2:47 PM 05/06/2018    5:31 PM 03/14/2018   12:01 PM 10/31/2017    8:10 AM  Depression screen PHQ 2/9  Decreased Interest 0 0 0 0 0  Down, Depressed,  Hopeless 0 0 0 0 0  PHQ - 2 Score 0 0 0 0 0  Altered sleeping 0 0     Tired, decreased energy 0 0     Change in appetite 0 0     Feeling bad or failure about yourself  0 0     Trouble concentrating 0 0     Moving slowly or fidgety/restless 0 0     Suicidal thoughts 0 0     PHQ-9 Score 0 0       Quality of Life:  Quality of Life - 06/06/23 0836       Quality of Life   Select Quality of Life      Quality of Life Scores   Health/Function Post 20.5 %    Socioeconomic Post 26.67 %    Psych/Spiritual Post 22.86 %    Family Post 27.6 %    GLOBAL Post 23.28 %             Personal Goals: Goals established at orientation with interventions provided to work toward goal.  Personal Goals and Risk Factors at Admission - 03/01/23 1422       Core Components/Risk Factors/Patient Goals on Admission    Weight Management Yes;Obesity;Weight Loss    Intervention Weight Management: Develop a combined nutrition and exercise program designed to reach desired caloric intake, while maintaining appropriate intake of nutrient and fiber, sodium and fats, and appropriate energy expenditure required for the weight goal.;Weight Management: Provide education and appropriate resources to help participant work on and attain dietary goals.;Weight Management/Obesity: Establish reasonable short term and long term weight goals.;Obesity: Provide education and appropriate resources to help participant work on and attain dietary goals.    Admit Weight 220 lb 14.4 oz (100.2 kg)    Expected Outcomes Short Term: Continue to assess and modify interventions until short term weight is achieved;Long Term: Adherence to  nutrition and physical activity/exercise program aimed toward attainment of established weight goal;Weight Loss: Understanding of general recommendations for a balanced deficit meal plan, which promotes 1-2 lb weight loss per week and includes a negative energy balance of [phone] kcal/d;Understanding  recommendations for meals to include 15-35% energy as protein, 25-35% energy from fat, 35-60% energy from carbohydrates, less than 200mg  of dietary cholesterol, 20-35 gm of total fiber daily;Understanding of distribution of calorie intake throughout the day with the consumption of 4-5 meals/snacks    Hypertension Yes    Intervention Provide education on lifestyle modifcations including regular physical activity/exercise, weight management, moderate sodium restriction and increased consumption of fresh fruit, vegetables, and low fat dairy, alcohol  moderation, and smoking cessation.;Monitor prescription use compliance.    Expected Outcomes Short Term: Continued assessment and intervention until BP is < 140/37mm HG in hypertensive participants. < 130/54mm HG in hypertensive participants with diabetes, heart failure or chronic kidney disease.;Long Term: Maintenance of blood pressure at goal levels.    Lipids Yes    Intervention Provide education and support for participant on nutrition & aerobic/resistive exercise along with prescribed medications to achieve LDL 70mg , HDL >40mg .    Expected Outcomes Short Term: Participant states understanding of desired cholesterol values and is compliant with medications prescribed. Participant is following exercise prescription and nutrition guidelines.;Long Term: Cholesterol controlled with medications as prescribed, with individualized exercise RX and with personalized nutrition plan. Value goals: LDL < 70mg , HDL > 40 mg.    Stress Yes    Intervention Offer individual and/or small group education and counseling on adjustment to heart disease, stress management and health-related lifestyle change. Teach and support self-help strategies.;Refer participants experiencing significant psychosocial distress to appropriate mental health specialists for further evaluation and treatment. When possible, include family members and significant others in education/counseling sessions.     Expected Outcomes Short Term: Participant demonstrates changes in health-related behavior, relaxation and other stress management skills, ability to obtain effective social support, and compliance with psychotropic medications if prescribed.;Long Term: Emotional wellbeing is indicated by absence of clinically significant psychosocial distress or social isolation.    Personal Goal Other Yes    Personal Goal Pt would like to get back to normal, and have long life, know more about sternal precautions    Intervention Will continue to monitor pt and progress workloads as tolerated without sign or symptom    Expected Outcomes Pt will achieve his goals              Personal Goals Discharge:  Goals and Risk Factor Review     Row Name 03/15/23 0959 04/16/23 1717 05/01/23 1421 05/31/23 1110       Core Components/Risk Factors/Patient Goals Review   Personal Goals Review Weight Management/Obesity;Hypertension;Lipids;Stress Weight Management/Obesity;Hypertension;Lipids;Stress Weight Management/Obesity;Hypertension;Lipids;Stress Weight Management/Obesity;Hypertension;Lipids;Stress    Review Rad started cardiac rehab on 03/05/23 and is off to a good start to exercise. Vital signs have been stable. HDL 19, other Lipid values WNL Mervil continues to do well with exercise at cardiac rehab. Vital signs remain stable. Remmy continues to do well with exercise at cardiac rehab. Vital signs remain stable. Senica has gained 2.4 kg since starting cardiac rehab. Anzel continues to do well with exercise at cardiac rehab. Vital signs remain stable. Joy has gained 3.4 kg since starting cardiac rehab. Diallo will complete cardiac rehab on 06/04/23    Expected Outcomes Coery will continue to participate in cardiac rehab for exercise, nutrition and lifestyle modifications Coery will continue to participate in cardiac rehab for  exercise, nutrition and lifestyle modifications Coery will continue to participate in cardiac  rehab for exercise, nutrition and lifestyle modifications Coery will continue to participate in cardiac rehab for exercise, nutrition and lifestyle modifications             Exercise Goals and Review:  Exercise Goals     Row Name 03/01/23 1409             Exercise Goals   Increase Physical Activity Yes       Intervention Provide advice, education, support and counseling about physical activity/exercise needs.;Develop an individualized exercise prescription for aerobic and resistive training based on initial evaluation findings, risk stratification, comorbidities and participant's personal goals.       Expected Outcomes Short Term: Attend rehab on a regular basis to increase amount of physical activity.;Long Term: Exercising regularly at least 3-5 days a week.;Long Term: Add in home exercise to make exercise part of routine and to increase amount of physical activity.       Increase Strength and Stamina Yes       Intervention Provide advice, education, support and counseling about physical activity/exercise needs.;Develop an individualized exercise prescription for aerobic and resistive training based on initial evaluation findings, risk stratification, comorbidities and participant's personal goals.       Expected Outcomes Short Term: Increase workloads from initial exercise prescription for resistance, speed, and METs.;Short Term: Perform resistance training exercises routinely during rehab and add in resistance training at home;Long Term: Improve cardiorespiratory fitness, muscular endurance and strength as measured by increased METs and functional capacity ( )       Able to understand and use rate of perceived exertion (RPE) scale Yes       Intervention Provide education and explanation on how to use RPE scale       Expected Outcomes Short Term: Able to use RPE daily in rehab to express subjective intensity level;Long Term:  Able to use RPE to guide intensity level when exercising  independently       Knowledge and understanding of Target Heart Rate Range (THRR) Yes       Intervention Provide education and explanation of THRR including how the numbers were predicted and where they are located for reference       Expected Outcomes Short Term: Able to state/look up THRR;Long Term: Able to use THRR to govern intensity when exercising independently;Short Term: Able to use daily as guideline for intensity in rehab       Understanding of Exercise Prescription Yes       Intervention Provide education, explanation, and written materials on patient's individual exercise prescription       Expected Outcomes Short Term: Able to explain program exercise prescription;Long Term: Able to explain home exercise prescription to exercise independently                Exercise Goals Re-Evaluation:  Exercise Goals Re-Evaluation     Row Name 03/05/23 0842 03/19/23 0843 04/16/23 1418 04/30/23 1650 05/21/23 1008     Exercise Goal Re-Evaluation   Exercise Goals Review Increase Physical Activity;Understanding of Exercise Prescription;Increase Strength and Stamina;Knowledge and understanding of Target Heart Rate Range (THRR);Able to understand and use rate of perceived exertion (RPE) scale Increase Physical Activity;Understanding of Exercise Prescription;Increase Strength and Stamina;Knowledge and understanding of Target Heart Rate Range (THRR);Able to understand and use rate of perceived exertion (RPE) scale Increase Physical Activity;Understanding of Exercise Prescription;Increase Strength and Stamina;Knowledge and understanding of Target Heart Rate Range (THRR);Able to understand and use  rate of perceived exertion (RPE) scale Increase Physical Activity;Understanding of Exercise Prescription;Increase Strength and Stamina;Knowledge and understanding of Target Heart Rate Range (THRR);Able to understand and use rate of perceived exertion (RPE) scale Increase Physical Activity;Understanding of Exercise  Prescription;Increase Strength and Stamina;Knowledge and understanding of Target Heart Rate Range (THRR);Able to understand and use rate of perceived exertion (RPE) scale   Comments Pt first day in the [facility] program. Pt tolerated exercise well with an average MET level of 1.95. Pt is learning his THRR, RPE and ExRx. Off to a great start. Reviewed MET's, goals and home ExRx. Pt tolerated exercise well with an average MET level of 2.6. Pt is feeling good about his goals and is increasing strength and stamina. He will continue to exercise on his own by walking, stretching and bike riding for 30-45 mins 2-3 days Reviewed MET's and goals. Pt tolerated exercise well with an average MET level of 3.0. Pt is feeling good about his goals. He has been having some issues with chest pain, maybe muscular, due to moving homes. Nurse aware. Overall pt doing well and progressing MET's Reviewed MET's. Pt tolerated exercise well with an average MET level of 3.35. Pt is doing well and progressing MET's Reviewed MET's and goals. Pt tolerated exercise well with an average MET level of 3.35. Pt is doing well, pt is limited with exertion by symptoms. But stays in a range that does not flair up symptoms. He is doing well and feels good about his goals and is gaining strength and stamina and feels like he's getting back to his old self   Expected Outcomes Will continue to monitor pt and progress workloads as tolerated without sign or symptom Will continue to monitor pt and progress workloads as tolerated without sign or symptom Will continue to monitor pt and progress workloads as tolerated without sign or symptom Will continue to monitor pt and progress workloads as tolerated without sign or symptom Will continue to monitor pt and progress workloads as tolerated without sign or symptom    Row Name 06/06/23 0851             Exercise Goal Re-Evaluation   Exercise Goals Review Increase Physical Activity;Understanding of  Exercise Prescription;Increase Strength and Stamina;Knowledge and understanding of Target Heart Rate Range (THRR);Able to understand and use rate of perceived exertion (RPE) scale       Comments Pt graduated the [employer] program. Pt tolerated exercise well with an average MET level of 3.15. He did very well and increased his post by 250 ft for a total of 159ft. He will continue to exercise by walking and using his bike 3 days a week for 30-45 mins per session       Expected Outcomes Pt will continue to exercise on his own and gain strength                Nutrition & Weight - Outcomes:  Pre Biometrics - 03/01/23 1410       Pre Biometrics   Height 5' 5 (1.651 m)    Weight 100.6 kg    Waist Circumference 46.75 inches    Hip Circumference 41 inches    Waist to Hip Ratio 1.14 %    BMI (Calculated) 36.91    Triceps Skinfold 12 mm    % Body Fat 33.3 %    Grip Strength 42 kg    Flexibility 5 in    Single Leg Stand 30 seconds  Post Biometrics - 06/01/23 9161        Post  Biometrics   Height 5' 5 (1.651 m)    Weight 105 kg    Waist Circumference 46 inches    Hip Circumference 42.5 inches    Waist to Hip Ratio 1.08 %    BMI (Calculated) 38.52    Triceps Skinfold 17 mm    % Body Fat 35 %    Grip Strength 48 kg    Flexibility 12.75 in    Single Leg Stand 30 seconds             Nutrition:  Nutrition Therapy & Goals - 04/06/23 0912       Nutrition Therapy   Diet Heart Healthy Diet    Drug/Food Interactions Statins/Certain Fruits      Personal Nutrition Goals   Nutrition Goal Patient to identify strategies for reducing cardiovascular risk by attending the [facility] education and nutrition series weekly.   goal not met.   Personal Goal #2 Patient to improve diet quality by using the plate method as a guide for meal planning to include lean protein/plant protein, fruits, vegetables, whole grains, nonfat dairy as part of a well-balanced diet.   goal  in progress.   Comments Goal in progress. However, Fardeen does not attend the [facility] education and nutrition series. Taz has medical history of CABGx4, HTN, CAD, OSA. His cholesterol is well controlled. His A1c remains in a pre-diabetic range. He is up 4.4# since starting with our program. Patient will benefit from participation in intensive cardiac rehab for nutrition, exercise, and lifestyle modification.      Intervention Plan   Intervention Prescribe, educate and counsel regarding individualized specific dietary modifications aiming towards targeted core components such as weight, hypertension, lipid management, diabetes, heart failure and other comorbidities.;Nutrition handout(s) given to patient.    Expected Outcomes Short Term Goal: Understand basic principles of dietary content, such as calories, fat, sodium, cholesterol and nutrients.;Long Term Goal: Adherence to prescribed nutrition plan.             Nutrition Discharge:  Nutrition Assessments - 06/06/23 0838       Rate Your Plate Scores   Post Score 70             Education Questionnaire Score:  Knowledge Questionnaire Score - 06/06/23 0837       Knowledge Questionnaire Score   Post Score 24/24            Pt graduated from cardiac rehab program 06/06/2023 completion of  33 exercise and 2 education sessions. Pt maintained good attendance and made progress during his participation in rehab as evidenced by increased MET level and distance achieved in walk test.   Medication list reconciled. Repeat  PHQ9 score-0.  Pt has made lifestyle changes and should be commended for the positive changes and improvements. There had been some limitations to being able to increase workloads to higher levels due to onset of chest discomfort, Irineo has followed up with his Cardiologist regarding the discomfort with a plan for an additional follow-up post cardiac rehab program completion.  Pt plans to continue a home exercise program.    Dr. Wilbert Bihari Medical Director Cardiac Rehabilitation

## 2023-06-13 ENCOUNTER — Encounter: Payer: Self-pay | Admitting: Gastroenterology
